# Patient Record
Sex: Female | Born: 1970
Health system: Southern US, Community
[De-identification: ages and names within clinical notes are randomized; demographics above are authoritative.]

## PROBLEM LIST (undated history)

## (undated) DIAGNOSIS — T7840XA Allergy, unspecified, initial encounter: Secondary | ICD-10-CM

## (undated) DIAGNOSIS — I10 Essential (primary) hypertension: Secondary | ICD-10-CM

## (undated) DIAGNOSIS — D259 Leiomyoma of uterus, unspecified: Secondary | ICD-10-CM

## (undated) DIAGNOSIS — O3410 Maternal care for benign tumor of corpus uteri, unspecified trimester: Secondary | ICD-10-CM

## (undated) DIAGNOSIS — F419 Anxiety disorder, unspecified: Secondary | ICD-10-CM

## (undated) DIAGNOSIS — M1990 Unspecified osteoarthritis, unspecified site: Secondary | ICD-10-CM

## (undated) HISTORY — DX: Unspecified osteoarthritis, unspecified site: M19.90

## (undated) HISTORY — DX: Leiomyoma of uterus, unspecified: D25.9

## (undated) HISTORY — PX: TONSILLECTOMY: SUR1361

## (undated) HISTORY — DX: Anxiety disorder, unspecified: F41.9

## (undated) HISTORY — DX: Allergy, unspecified, initial encounter: T78.40XA

## (undated) HISTORY — DX: Essential (primary) hypertension: I10

## (undated) HISTORY — DX: Leiomyoma of uterus, unspecified: O34.10

---

## 2007-04-01 ENCOUNTER — Ambulatory Visit: Payer: Self-pay | Admitting: Obstetrics & Gynecology

## 2007-04-08 ENCOUNTER — Encounter: Payer: Self-pay | Admitting: Family

## 2007-04-08 ENCOUNTER — Ambulatory Visit: Payer: Self-pay | Admitting: Family

## 2007-04-25 ENCOUNTER — Ambulatory Visit (HOSPITAL_COMMUNITY): Admission: RE | Admit: 2007-04-25 | Discharge: 2007-04-25 | Payer: Self-pay | Admitting: Obstetrics & Gynecology

## 2007-05-07 ENCOUNTER — Ambulatory Visit: Payer: Self-pay | Admitting: Physician Assistant

## 2007-05-15 ENCOUNTER — Ambulatory Visit (HOSPITAL_COMMUNITY): Admission: RE | Admit: 2007-05-15 | Discharge: 2007-05-15 | Payer: Self-pay | Admitting: Obstetrics & Gynecology

## 2007-05-30 ENCOUNTER — Ambulatory Visit (HOSPITAL_COMMUNITY): Admission: RE | Admit: 2007-05-30 | Discharge: 2007-05-30 | Payer: Self-pay | Admitting: Obstetrics & Gynecology

## 2007-06-10 ENCOUNTER — Ambulatory Visit: Payer: Self-pay | Admitting: Family

## 2007-07-08 ENCOUNTER — Ambulatory Visit: Payer: Self-pay | Admitting: Obstetrics and Gynecology

## 2007-07-12 ENCOUNTER — Ambulatory Visit (HOSPITAL_COMMUNITY): Admission: RE | Admit: 2007-07-12 | Discharge: 2007-07-12 | Payer: Self-pay | Admitting: Obstetrics & Gynecology

## 2007-07-30 ENCOUNTER — Ambulatory Visit: Payer: Self-pay | Admitting: Family

## 2007-08-21 ENCOUNTER — Ambulatory Visit (HOSPITAL_COMMUNITY): Admission: RE | Admit: 2007-08-21 | Discharge: 2007-08-21 | Payer: Self-pay | Admitting: Obstetrics & Gynecology

## 2007-08-23 ENCOUNTER — Ambulatory Visit: Payer: Self-pay | Admitting: Physician Assistant

## 2007-09-10 ENCOUNTER — Ambulatory Visit: Payer: Self-pay | Admitting: Obstetrics and Gynecology

## 2007-09-27 ENCOUNTER — Ambulatory Visit: Payer: Self-pay | Admitting: Obstetrics and Gynecology

## 2007-10-04 ENCOUNTER — Ambulatory Visit: Payer: Self-pay | Admitting: Family

## 2007-10-11 ENCOUNTER — Ambulatory Visit: Payer: Self-pay | Admitting: Family

## 2007-10-18 ENCOUNTER — Ambulatory Visit: Payer: Self-pay | Admitting: Physician Assistant

## 2007-10-22 ENCOUNTER — Inpatient Hospital Stay (HOSPITAL_COMMUNITY): Admission: AD | Admit: 2007-10-22 | Discharge: 2007-10-24 | Payer: Self-pay | Admitting: Obstetrics & Gynecology

## 2007-10-22 ENCOUNTER — Ambulatory Visit: Payer: Self-pay | Admitting: Family

## 2007-11-08 ENCOUNTER — Ambulatory Visit: Payer: Self-pay | Admitting: Physician Assistant

## 2007-12-06 ENCOUNTER — Ambulatory Visit: Payer: Self-pay | Admitting: Physician Assistant

## 2008-07-30 ENCOUNTER — Ambulatory Visit: Payer: Self-pay | Admitting: Family Medicine

## 2008-08-20 ENCOUNTER — Ambulatory Visit: Payer: Self-pay | Admitting: Family Medicine

## 2008-08-20 DIAGNOSIS — M722 Plantar fascial fibromatosis: Secondary | ICD-10-CM

## 2008-08-20 DIAGNOSIS — R635 Abnormal weight gain: Secondary | ICD-10-CM

## 2008-08-21 ENCOUNTER — Encounter: Payer: Self-pay | Admitting: Family Medicine

## 2008-08-24 LAB — CONVERTED CEMR LAB
ALT: 11 units/L (ref 0–35)
Albumin: 4.2 g/dL (ref 3.5–5.2)
CO2: 22 meq/L (ref 19–32)
Calcium: 9.3 mg/dL (ref 8.4–10.5)
Chloride: 105 meq/L (ref 96–112)
Cholesterol: 194 mg/dL (ref 0–200)
Glucose, Bld: 78 mg/dL (ref 70–99)
Sodium: 140 meq/L (ref 135–145)
Total Bilirubin: 0.6 mg/dL (ref 0.3–1.2)
Total Protein: 7.2 g/dL (ref 6.0–8.3)
Triglycerides: 108 mg/dL (ref <150)
VLDL: 22 mg/dL (ref 0–40)

## 2008-09-02 ENCOUNTER — Ambulatory Visit: Payer: Self-pay | Admitting: Obstetrics & Gynecology

## 2008-09-02 ENCOUNTER — Encounter: Payer: Self-pay | Admitting: Obstetrics & Gynecology

## 2009-10-12 ENCOUNTER — Ambulatory Visit: Payer: Self-pay | Admitting: Family Medicine

## 2009-10-14 ENCOUNTER — Encounter: Payer: Self-pay | Admitting: Family Medicine

## 2010-01-13 ENCOUNTER — Ambulatory Visit: Payer: Self-pay | Admitting: Family Medicine

## 2010-04-05 NOTE — Assessment & Plan Note (Signed)
Summary: CPE w/o pap   Vital Signs:  Patient profile:   40 year old female Menstrual status:  regular Height:      65 inches Weight:      199 pounds BMI:     33.24 O2 Sat:      97 % on Room air Pulse rate:   93 / minute BP sitting:   126 / 73  (left arm) Cuff size:   large  Vitals Entered By: Payton Spark CMA (October 12, 2009 3:34 PM)  O2 Flow:  Room air CC: CPE w/ out pap. Employment form.  Vision Screening:Left eye w/o correction: 20 / 20 Right Eye w/o correction: 20 / 20 Both eyes w/o correction:  20/ 20  Color vision testing: normal      Vision Entered By: Payton Spark CMA (October 12, 2009 3:35 PM)   Primary Care Provider:  Seymour Bars DO  CC:  CPE w/ out pap. Employment form.Marland Kitchen  History of Present Illness: 40 yo WF presents for CPE without pap smear.  She is G1P1 and would like to get pregnant again.  She would also like to lose wt but admits to some poor dietary habits and lack of regular exercise.  She is taking a MVI daily.  Denies a fam hx of breast or colon cancer.  Denies fam hx of premature heart dz.  She sees gyn for her pap smears.  She had normal labs last year.  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    Allergies: No Known Drug Allergies  Past History:  Past Medical History: Reviewed history from 08/20/2008 and no changes required. G1P1001 NSVD, term baby girl  plantar fasciitis  Past Surgical History: Reviewed history from 08/20/2008 and no changes required. tonsillectomy  Family History: Reviewed history from 07/30/2008 and no changes required. Mother, Healthy Father, Helthy Brother, Healthy Sister, Healthy  Social History: Reviewed history from 08/20/2008 and no changes required. [place] [employer], broadcasting/film/video at [employer]. Has masters degree. 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 Never smoked. Denies ETOH. Exercises 3 days/ wk. Interested in getting pregant -- not on birth  control  Review of Systems  The patient denies anorexia, fever, weight loss, weight gain, vision loss, decreased hearing, hoarseness, chest pain, syncope, dyspnea on exertion, peripheral edema, prolonged cough, headaches, hemoptysis, abdominal pain, melena, hematochezia, severe indigestion/heartburn, hematuria, incontinence, genital sores, muscle weakness, suspicious skin lesions, transient blindness, difficulty walking, depression, unusual weight change, abnormal bleeding, enlarged lymph nodes, angioedema, breast masses, and testicular masses.    Physical Exam  General:  alert, well-developed, well-nourished, well-hydrated, and overweight-appearing.   Head:  normocephalic and atraumatic.   Eyes:  pupils equal, pupils round, and pupils reactive to light.   Ears:  no external deformities.   Nose:  no nasal discharge.   Mouth:  good dentition and pharynx pink and moist.   Neck:  no masses.   Lungs:  Normal respiratory effort, chest expands symmetrically. Lungs are clear to auscultation, no crackles or wheezes. Heart:  Normal rate and regular rhythm. S1 and S2 normal without gallop, murmur, click, rub or other extra sounds. Abdomen:  Bowel sounds positive,abdomen soft and non-tender without masses, organomegaly Pulses:  2+ radial and pedal pulses Extremities:  no LE edema Skin:  color normal and no suspicious lesions.   Cervical Nodes:  No lymphadenopathy noted Psych:  good eye contact, not anxious appearing, and not depressed appearing.  Impression & Recommendations:  Problem # 1:  HEALTH MAINTENANCE EXAM (ICD-V70.0) Keeping healthy checklist for women reviewed. BP at goal.  BMI 33 with a 12 lb wt gain.  Work on [employer], regular exercise, wt loss. MVI daily with Calcium/ D daily. Due for fasting labs in 1 yr. PPD today.  School form today.  REad PPD in 2 days.  Tdap is UTD. pap smears thru GYN.  Other Orders: TB Skin Test [phone]) Admin 1st Vaccine (11914) Admin 1st Vaccine  [facility]) [id number])  Patient Instructions: 1)  Weight Watchers Online  2)  1500 kcal/ day -- goal. 3)  45+ min of exercise 5 days/ wk -- goal 4)  Return for TB reading in 2 days. 5)  Return for a nurse visit WT CHECK in 6 wks.     PPD Application    Vaccine Type: PPD    Site: right forearm    Dose: 0.1 ml    Route: ID    Given by: Payton Spark CMA   Appended Document: CPE w/o pap   PPD Results    Date of reading: 10/14/2009    Results: < 5mm    Interpretation: negative

## 2010-04-05 NOTE — Assessment & Plan Note (Signed)
Summary: WEIGHT CK  Nurse Visit   Vital Signs:  Patient profile:   40 year old female Menstrual status:  regular Height:      65 inches Weight:      201 pounds   Primary Care Provider:  Seymour Bars DO   History of Present Illness: Wt check using wt watchers online. hadnt stuck closley with it lately   Allergies: No Known Drug Allergies  Orders Added: 1)  Est. Patient Level I [04540]

## 2010-04-05 NOTE — Letter (Signed)
Summary: Health Exam Form/[facility] [employer] [facility]  Health Exam Form/[facility]   Imported By: Lanelle Bal 10/21/2009 12:04:47  _____________________________________________________________________  External Attachment:    Type:   Image     Comment:   External Document

## 2010-07-19 NOTE — Assessment & Plan Note (Signed)
NAME:  Elaine Murillo, Elaine Murillo NO.:  [id number]   MEDICAL RECORD NO.:  [id number]          PATIENT TYPE:  POB   LOCATION:  [facility] at [place]         FACILITY:  [facility]   PHYSICIAN:  Elsie Lincoln, MD      DATE OF BIRTH:  September 29, 1970   DATE OF SERVICE:                                  CLINIC NOTE   The patient is a 40 year old para 1 female who presents for her yearly  exam.  The patient delivered with [place] approximately 10 months ago, a  beautiful baby grown, name Sophia.  She is breast-feeding and doing  well.  She is sexually active occasionally and uses some sort of the  rhythm method.  For birth control, she states that they do not have sex  that often and whatever happens will happen.  The patient is having  normal periods without any problems.  She has absolutely no complaints  today.   PAST MEDICAL HISTORY:  Fibroid tumors of the uterus.   PAST SURGICAL HISTORY:  Tonsillectomy.   GYN HISTORY:  Fibroid tumors that is not causing any problems in labor.  She had 1 vaginal delivery without incident.  The baby weighed 7 pounds  and 11 ounces and was 20-1/4th inches long.   FAMILY HISTORY:  No familial female cancers.  No colon cancer.   MEDICATIONS:  None.   ALLERGIES:  None.   HEALTH CARE MAINTENANCE:  The patient flosses, but not regularly.  The  patient uses sunscreen, but not regularly.  The patient does regularly  use seatbelts.  She is not due for a mammogram until age 89 and  colonoscopy until age 71.   PHYSICAL EXAMINATION:  VITAL SIGNS:  Pulse 88, blood pressure 124/86,  and weight 188.  GENERAL:  Well nourished and well developed in no apparent distress.  HEENT:  Normocephalic, atraumatic.  Good dentition.  Thyroid, no masses.  LUNGS:  Clear to auscultation bilaterally.  HEART:  Regular rate and rhythm.  BREASTS:  No masses or skin changes.  LYMPHATIC:  No lymphadenopathy.  ABDOMEN:  Soft and nontender.  No organomegaly.  No hernia.  GENITALIA:   Tanner 5.  Vagina is pink and normal rugae.  Cervix is  closed and nontender.  Urethra and bladder is well suspended and  nontender.  Adnexa, no masses and nontender.  RECTAL:  No hemorrhoids.  EXTREMITIES:  Nontender.   ASSESSMENT AND PLAN:  A 40 year old para 1 female with well-woman exam.  1. Pap smear.  2. The patient is to take prenatal vitamins, when she tries to get      pregnant.  3. Return to clinic in a year or sooner, if needed.           ______________________________  Elsie Lincoln, MD     KL/MEDQ  D:  09/02/2008  T:  09/03/2008  Job:  [id number]

## 2011-11-20 ENCOUNTER — Encounter: Payer: Self-pay | Admitting: Physician Assistant

## 2011-11-20 ENCOUNTER — Ambulatory Visit (INDEPENDENT_AMBULATORY_CARE_PROVIDER_SITE_OTHER): Payer: 59 | Admitting: Physician Assistant

## 2011-11-20 VITALS — BP 119/6 | Temp 97.2°F | Wt 206.0 lb

## 2011-11-20 DIAGNOSIS — Z113 Encounter for screening for infections with a predominantly sexual mode of transmission: Secondary | ICD-10-CM

## 2011-11-20 DIAGNOSIS — Z1151 Encounter for screening for human papillomavirus (HPV): Secondary | ICD-10-CM

## 2011-11-20 DIAGNOSIS — Z3480 Encounter for supervision of other normal pregnancy, unspecified trimester: Secondary | ICD-10-CM

## 2011-11-20 DIAGNOSIS — Z124 Encounter for screening for malignant neoplasm of cervix: Secondary | ICD-10-CM

## 2011-11-20 DIAGNOSIS — O09529 Supervision of elderly multigravida, unspecified trimester: Secondary | ICD-10-CM

## 2011-11-20 NOTE — Patient Instructions (Signed)
Pregnancy - First Trimester  During sexual intercourse, millions of sperm go into the vagina. Only 1 sperm will penetrate and fertilize the female egg while it is in the Fallopian tube. One week later, the fertilized egg implants into the wall of the uterus. An embryo begins to develop into a baby. At 6 to 8 weeks, the eyes and face are formed and the heartbeat can be seen on ultrasound. At the end of 12 weeks (first trimester), all the baby's organs are formed. Now that you are pregnant, you will want to do everything you can to have a healthy baby. Two of the most important things are to get good prenatal care and follow your caregiver's instructions. Prenatal care is all the medical care you receive before the baby's birth. It is given to prevent, find, and treat problems during the pregnancy and childbirth.  PRENATAL EXAMS   During prenatal visits, your weight, blood pressure and urine are checked. This is done to make sure you are healthy and progressing normally during the pregnancy.   A pregnant woman should gain 25 to 35 pounds during the pregnancy. However, if you are over weight or underweight, your caregiver will advise you regarding your weight.   Your caregiver will ask and answer questions for you.   Blood work, cervical cultures, other necessary tests and a Pap test are done during your prenatal exams. These tests are done to check on your health and the probable health of your baby. Tests are strongly recommended and done for HIV with your permission. This is the virus that causes AIDS. These tests are done because medications can be given to help prevent your baby from being born with this infection should you have been infected without knowing it. Blood work is also used to find out your blood type, previous infections and follow your blood levels (hemoglobin).   Low hemoglobin (anemia) is common during pregnancy. Iron and vitamins are given to help prevent this. Later in the pregnancy, blood  tests for diabetes will be done along with any other tests if any problems develop. You may need tests to make sure you and the baby are doing well.   You may need other tests to make sure you and the baby are doing well.  CHANGES DURING THE FIRST TRIMESTER (THE FIRST 3 MONTHS OF PREGNANCY)  Your body goes through many changes during pregnancy. They vary from person to person. Talk to your caregiver about changes you notice and are concerned about. Changes can include:   Your menstrual period stops.   The egg and sperm carry the genes that determine what you look like. Genes from you and your partner are forming a baby. The female genes determine whether the baby is a boy or a girl.   Your body increases in girth and you may feel bloated.   Feeling sick to your stomach (nauseous) and throwing up (vomiting). If the vomiting is uncontrollable, call your caregiver.   Your breasts will begin to enlarge and become tender.   Your nipples may stick out more and become darker.   The need to urinate more. Painful urination may mean you have a bladder infection.   Tiring easily.   Loss of appetite.   Cravings for certain kinds of food.   At first, you may gain or lose a couple of pounds.   You may have changes in your emotions from day to day (excited to be pregnant or concerned something may go wrong with   the pregnancy and baby).   You may have more vivid and strange dreams.  HOME CARE INSTRUCTIONS    It is very important to avoid all smoking, alcohol and un-prescribed drugs during your pregnancy. These affect the formation and growth of the baby. Avoid chemicals while pregnant to ensure the delivery of a healthy infant.   Start your prenatal visits by the 12th week of pregnancy. They are usually scheduled monthly at first, then more often in the last 2 months before delivery. Keep your caregiver's appointments. Follow your caregiver's instructions regarding medication use, blood and lab tests, exercise, and  diet.   During pregnancy, you are providing food for you and your baby. Eat regular, well-balanced meals. Choose foods such as meat, fish, milk and other low fat dairy products, vegetables, fruits, and whole-grain breads and cereals. Your caregiver will tell you of the ideal weight gain.   You can help morning sickness by keeping soda crackers at the bedside. Eat a couple before arising in the morning. You may want to use the crackers without salt on them.   Eating 4 to 5 small meals rather than 3 large meals a day also may help the nausea and vomiting.   Drinking liquids between meals instead of during meals also seems to help nausea and vomiting.   A physical sexual relationship may be continued throughout pregnancy if there are no other problems. Problems may be early (premature) leaking of amniotic fluid from the membranes, vaginal bleeding, or belly (abdominal) pain.   Exercise regularly if there are no restrictions. Check with your caregiver or physical therapist if you are unsure of the safety of some of your exercises. Greater weight gain will occur in the last 2 trimesters of pregnancy. Exercising will help:   Control your weight.   Keep you in shape.   Prepare you for labor and delivery.   Help you lose your pregnancy weight after you deliver your baby.   Wear a good support or jogging bra for breast tenderness during pregnancy. This may help if worn during sleep too.   Ask when prenatal classes are available. Begin classes when they are offered.   Do not use hot tubs, steam rooms or saunas.   Wear your seat belt when driving. This protects you and your baby if you are in an accident.   Avoid raw meat, uncooked cheese, cat litter boxes and soil used by cats throughout the pregnancy. These carry germs that can cause birth defects in the baby.   The first trimester is a good time to visit your dentist for your dental health. Getting your teeth cleaned is OK. Use a softer toothbrush and brush  gently during pregnancy.   Ask for help if you have financial, counseling or nutritional needs during pregnancy. Your caregiver will be able to offer counseling for these needs as well as refer you for other special needs.   Do not take any medications or herbs unless told by your caregiver.   Inform your caregiver if there is any mental or physical domestic violence.   Make a list of emergency phone numbers of family, friends, hospital, and police and fire departments.   Write down your questions. Take them to your prenatal visit.   Do not douche.   Do not cross your legs.   If you have to stand for long periods of time, rotate you feet or take small steps in a circle.   You may have more vaginal secretions that may   require a sanitary pad. Do not use tampons or scented sanitary pads.  MEDICATIONS AND DRUG USE IN PREGNANCY   Take prenatal vitamins as directed. The vitamin should contain 1 milligram of folic acid. Keep all vitamins out of reach of children. Only a couple vitamins or tablets containing iron may be fatal to a baby or young child when ingested.   Avoid use of all medications, including herbs, over-the-counter medications, not prescribed or suggested by your caregiver. Only take over-the-counter or prescription medicines for pain, discomfort, or fever as directed by your caregiver. Do not use aspirin, ibuprofen, or naproxen unless directed by your caregiver.   Let your caregiver also know about herbs you may be using.   Alcohol is related to a number of birth defects. This includes fetal alcohol syndrome. All alcohol, in any form, should be avoided completely. Smoking will cause low birth rate and premature babies.   Street or illegal drugs are very harmful to the baby. They are absolutely forbidden. A baby born to an addicted mother will be addicted at birth. The baby will go through the same withdrawal an adult does.   Let your caregiver know about any medications that you have to take  and for what reason you take them.  MISCARRIAGE IS COMMON DURING PREGNANCY  A miscarriage does not mean you did something wrong. It is not a reason to worry about getting pregnant again. Your caregiver will help you with questions you may have. If you have a miscarriage, you may need minor surgery.  SEEK MEDICAL CARE IF:   You have any concerns or worries during your pregnancy. It is better to call with your questions if you feel they cannot wait, rather than worry about them.  SEEK IMMEDIATE MEDICAL CARE IF:    An unexplained oral temperature above 102 F (38.9 C) develops, or as your caregiver suggests.   You have leaking of fluid from the vagina (birth canal). If leaking membranes are suspected, take your temperature and inform your caregiver of this when you call.   There is vaginal spotting or bleeding. Notify your caregiver of the amount and how many pads are used.   You develop a bad smelling vaginal discharge with a change in the color.   You continue to feel sick to your stomach (nauseated) and have no relief from remedies suggested. You vomit blood or coffee ground-like materials.   You lose more than 2 pounds of weight in 1 week.   You gain more than 2 pounds of weight in 1 week and you notice swelling of your face, hands, feet, or legs.   You gain 5 pounds or more in 1 week (even if you do not have swelling of your hands, face, legs, or feet).   You get exposed to German measles and have never had them.   You are exposed to fifth disease or chickenpox.   You develop belly (abdominal) pain. Round ligament discomfort is a common non-cancerous (benign) cause of abdominal pain in pregnancy. Your caregiver still must evaluate this.   You develop headache, fever, diarrhea, pain with urination, or shortness of breath.   You fall or are in a car accident or have any kind of trauma.   There is mental or physical violence in your home.  Document Released: 02/14/2001 Document Revised: 02/09/2011  Document Reviewed: 08/18/2008  ExitCare Patient Information 2012 [employer].
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
## 2011-11-21 LAB — COMPREHENSIVE METABOLIC PANEL
ALT: 10 U/L (ref 0–35)
Alkaline Phosphatase: 52 U/L (ref 39–117)
CO2: 26 mEq/L (ref 19–32)
Creat: 0.69 mg/dL (ref 0.50–1.10)
Glucose, Bld: 59 mg/dL — ABNORMAL LOW (ref 70–99)
Sodium: 138 mEq/L (ref 135–145)
Total Bilirubin: 0.3 mg/dL (ref 0.3–1.2)
Total Protein: 6.9 g/dL (ref 6.0–8.3)

## 2011-11-21 LAB — OBSTETRIC PANEL
Eosinophils Absolute: 0.1 10*3/uL (ref 0.0–0.7)
Eosinophils Relative: 1 % (ref 0–5)
HCT: 43 % (ref 36.0–46.0)
Hemoglobin: 14.6 g/dL (ref 12.0–15.0)
Lymphocytes Relative: 22 % (ref 12–46)
Lymphs Abs: 2.6 10*3/uL (ref 0.7–4.0)
MCH: 30.1 pg (ref 26.0–34.0)
MCV: 88.7 fL (ref 78.0–100.0)
Monocytes Absolute: 0.7 10*3/uL (ref 0.1–1.0)
Monocytes Relative: 6 % (ref 3–12)
RBC: 4.85 MIL/uL (ref 3.87–5.11)
Rh Type: POSITIVE
Rubella: 14.3 IU/mL — ABNORMAL HIGH
WBC: 11.5 10*3/uL — ABNORMAL HIGH (ref 4.0–10.5)

## 2011-11-21 LAB — HIV ANTIBODY (ROUTINE TESTING W REFLEX): HIV: NONREACTIVE

## 2011-11-25 LAB — CULTURE, URINE COMPREHENSIVE: Colony Count: 30000

## 2011-12-08 ENCOUNTER — Telehealth: Payer: Self-pay | Admitting: *Deleted

## 2011-12-08 DIAGNOSIS — Z3480 Encounter for supervision of other normal pregnancy, unspecified trimester: Secondary | ICD-10-CM

## 2011-12-08 NOTE — Telephone Encounter (Signed)
Pt called requesting 1 st trimester screening be scheduled.

## 2011-12-15 ENCOUNTER — Other Ambulatory Visit: Payer: Self-pay

## 2011-12-15 ENCOUNTER — Ambulatory Visit (HOSPITAL_COMMUNITY)
Admission: RE | Admit: 2011-12-15 | Discharge: 2011-12-15 | Disposition: A | Discharge status: Home / Self Care | Payer: 59 | Source: Ambulatory Visit | Attending: Obstetrics & Gynecology | Admitting: Obstetrics & Gynecology

## 2011-12-15 DIAGNOSIS — Z3480 Encounter for supervision of other normal pregnancy, unspecified trimester: Secondary | ICD-10-CM

## 2011-12-15 DIAGNOSIS — IMO0002 Reserved for concepts with insufficient information to code with codable children: Secondary | ICD-10-CM

## 2011-12-15 DIAGNOSIS — O351XX0 Maternal care for (suspected) chromosomal abnormality in fetus, not applicable or unspecified: Secondary | ICD-10-CM | POA: Insufficient documentation

## 2011-12-15 DIAGNOSIS — O3510X0 Maternal care for (suspected) chromosomal abnormality in fetus, unspecified, not applicable or unspecified: Secondary | ICD-10-CM | POA: Insufficient documentation

## 2011-12-15 DIAGNOSIS — Z3689 Encounter for other specified antenatal screening: Secondary | ICD-10-CM | POA: Insufficient documentation

## 2011-12-15 DIAGNOSIS — O09529 Supervision of elderly multigravida, unspecified trimester: Secondary | ICD-10-CM | POA: Insufficient documentation

## 2011-12-15 NOTE — Addendum Note (Signed)
Encounter addended by: Alessandra Bevels. Chase Picket, RN on: 12/15/2011  1:47 PM<BR>     Documentation filed: Charges VN

## 2011-12-15 NOTE — Progress Notes (Signed)
Elaine Murillo  was seen today for an ultrasound appointment.  See full report in [facility].  Alpha Gula, MD  Single IUP at 12 2/7 weeks Normal NT (1.2 mm); nasal bone was visualized After counseling, the patient elected to undergo cell free fetal DNA testing ([facility])  Recommend follow up ultrasound in 6-7 weeks for anatomy. Offer MSAFP in the second trimester for ONTD screening

## 2011-12-18 ENCOUNTER — Ambulatory Visit (INDEPENDENT_AMBULATORY_CARE_PROVIDER_SITE_OTHER): Payer: 59 | Admitting: Advanced Practice Midwife

## 2011-12-18 ENCOUNTER — Encounter: Payer: Self-pay | Admitting: Obstetrics & Gynecology

## 2011-12-18 VITALS — BP 121/78 | Temp 98.8°F | Wt 203.0 lb

## 2011-12-18 DIAGNOSIS — Z3490 Encounter for supervision of normal pregnancy, unspecified, unspecified trimester: Secondary | ICD-10-CM | POA: Insufficient documentation

## 2011-12-18 DIAGNOSIS — Z3480 Encounter for supervision of other normal pregnancy, unspecified trimester: Secondary | ICD-10-CM

## 2011-12-18 DIAGNOSIS — Z23 Encounter for immunization: Secondary | ICD-10-CM

## 2011-12-18 MED ORDER — INFLUENZA VIRUS VACC SPLIT PF IM SUSP
0.5000 mL | Freq: Once | INTRAMUSCULAR | Status: AC
  Administered 2011-12-18: 0.5 mL via INTRAMUSCULAR

## 2011-12-18 NOTE — Progress Notes (Signed)
Doing well.  Denies vaginal bleeding, LOF, cramping.  Anxious about pending [facility]ony results.  Has not told family/friends about pregnancy yet.  Reassured pt most pregnancies normal at any age.  Flu shot given today.

## 2011-12-18 NOTE — Addendum Note (Signed)
Addended by: Granville Lewis on: 12/18/2011 10:57 AM   Modules accepted: Orders

## 2011-12-18 NOTE — Progress Notes (Signed)
p-77 

## 2011-12-19 ENCOUNTER — Ambulatory Visit (HOSPITAL_COMMUNITY): Admission: RE | Admit: 2011-12-19 | Payer: 59 | Source: Ambulatory Visit

## 2011-12-19 ENCOUNTER — Ambulatory Visit (HOSPITAL_COMMUNITY): Payer: 59

## 2011-12-26 ENCOUNTER — Telehealth (HOSPITAL_COMMUNITY): Payer: Self-pay | Admitting: MS"

## 2011-12-26 NOTE — Telephone Encounter (Signed)
Called Elaine Murillo to discuss her Elaine Murillo, cell free fetal DNA testing. Testing was offered because of advanced maternal age. We reviewed that these are within normal limits, showing a less than 1 in 10,000 risk for trisomies 21, 18 and 13. We reviewed that this testing identifies > 99% of pregnancies with trisomy 21, >98% of pregnancies with trisomy 40, and >80% with trisomy 92; the false positive rate is <0.1% for all conditions. Testing was also performed for X and Y chromosome analysis. This did not show evidence of aneuploidy for X or Y. Ms. Elaine Murillo declined information regarding fetal gender at this time. She will call our office back, should she elect to obtain information regarding fetal gender from the X and Y analysis. X and Y analysis has a detection rate of approximately 99%. She understands that this testing does not identify all genetic conditions. All questions were answered to her satisfaction, she was encouraged to call with additional questions or concerns.  Quinn Plowman, MS Certified Genetic Counselor 12/26/2011 12:17 PM     Left message for patient to return call.   Elaine Murillo 12/26/2011 11:29 AM

## 2012-01-05 ENCOUNTER — Telehealth (HOSPITAL_COMMUNITY): Payer: Self-pay | Admitting: MS"

## 2012-01-05 NOTE — Telephone Encounter (Signed)
Ms. Elaine Murillo called back to inquire about fetal gender from cell free fetal DNA ([facility]) testing given that she would like to know gender now. At the time that we previously discussed results, the patient did not wish to know fetal gender. We discussed that testing performed for X and Y chromosome analysis was consistent with female gender. X and Y analysis has a detection rate of approximately 99%. All questions were answered to her satisfaction, she was encouraged to call with additional questions or concerns.  Quinn Plowman, MS Patent attorney

## 2012-01-17 ENCOUNTER — Encounter: Payer: Self-pay | Admitting: Obstetrics & Gynecology

## 2012-01-17 ENCOUNTER — Ambulatory Visit (INDEPENDENT_AMBULATORY_CARE_PROVIDER_SITE_OTHER): Payer: 59 | Admitting: Obstetrics & Gynecology

## 2012-01-17 VITALS — BP 119/63 | Temp 97.7°F | Wt 206.0 lb

## 2012-01-17 DIAGNOSIS — Z3480 Encounter for supervision of other normal pregnancy, unspecified trimester: Secondary | ICD-10-CM

## 2012-01-17 DIAGNOSIS — O0990 Supervision of high risk pregnancy, unspecified, unspecified trimester: Secondary | ICD-10-CM

## 2012-01-17 LAB — TSH: TSH: 1.223 u[IU]/mL (ref 0.350–4.500)

## 2012-01-17 NOTE — Progress Notes (Signed)
Harmony negative; AFP today.  Lubriderm for dry skin.  Has anatomy scan scheduled.

## 2012-01-17 NOTE — Progress Notes (Signed)
p=90 

## 2012-01-25 ENCOUNTER — Encounter: Payer: Self-pay | Admitting: Obstetrics & Gynecology

## 2012-01-30 ENCOUNTER — Other Ambulatory Visit (HOSPITAL_COMMUNITY): Payer: 59

## 2012-02-05 ENCOUNTER — Other Ambulatory Visit (HOSPITAL_COMMUNITY): Payer: Self-pay | Admitting: Maternal and Fetal Medicine

## 2012-02-05 ENCOUNTER — Ambulatory Visit (HOSPITAL_COMMUNITY)
Admission: RE | Admit: 2012-02-05 | Discharge: 2012-02-05 | Disposition: A | Discharge status: Home / Self Care | Payer: 59 | Source: Ambulatory Visit | Attending: Obstetrics & Gynecology | Admitting: Obstetrics & Gynecology

## 2012-02-05 ENCOUNTER — Encounter (HOSPITAL_COMMUNITY): Payer: Self-pay

## 2012-02-05 VITALS — BP 107/64 | HR 77 | Wt 207.0 lb

## 2012-02-05 DIAGNOSIS — O09529 Supervision of elderly multigravida, unspecified trimester: Secondary | ICD-10-CM | POA: Insufficient documentation

## 2012-02-05 DIAGNOSIS — O358XX0 Maternal care for other (suspected) fetal abnormality and damage, not applicable or unspecified: Secondary | ICD-10-CM | POA: Insufficient documentation

## 2012-02-05 DIAGNOSIS — IMO0002 Reserved for concepts with insufficient information to code with codable children: Secondary | ICD-10-CM

## 2012-02-05 DIAGNOSIS — O3410 Maternal care for benign tumor of corpus uteri, unspecified trimester: Secondary | ICD-10-CM | POA: Insufficient documentation

## 2012-02-05 DIAGNOSIS — Z363 Encounter for antenatal screening for malformations: Secondary | ICD-10-CM | POA: Insufficient documentation

## 2012-02-05 DIAGNOSIS — Z1389 Encounter for screening for other disorder: Secondary | ICD-10-CM | POA: Insufficient documentation

## 2012-02-05 NOTE — Progress Notes (Signed)
Vendetta Cada  was seen today for an ultrasound appointment.  See full report in [facility].  Impression: Single IUP at 19 5/7 weeks Normal detailed fetal anatomy; somewhat limited views of the face (profile) and heart (aortic arch)  were obtained due to fetal position No markers associated with aneuploidy were noted Normal amniotic fluid volume  NIPT (Harmony test) - low risk for aneuploidy  Recommendations: Recommend follow up ultrasound in 4 weeks for interval growth and to reevaluate fetal heart and face.  Alpha Gula, MD

## 2012-02-07 ENCOUNTER — Encounter: Payer: Self-pay | Admitting: Obstetrics & Gynecology

## 2012-02-14 ENCOUNTER — Ambulatory Visit (INDEPENDENT_AMBULATORY_CARE_PROVIDER_SITE_OTHER): Payer: 59 | Admitting: Obstetrics & Gynecology

## 2012-02-14 ENCOUNTER — Encounter: Payer: Self-pay | Admitting: Obstetrics & Gynecology

## 2012-02-14 VITALS — BP 128/75 | Temp 98.5°F | Wt 208.0 lb

## 2012-02-14 DIAGNOSIS — Z3480 Encounter for supervision of other normal pregnancy, unspecified trimester: Secondary | ICD-10-CM

## 2012-02-14 DIAGNOSIS — Z3490 Encounter for supervision of normal pregnancy, unspecified, unspecified trimester: Secondary | ICD-10-CM

## 2012-02-14 NOTE — Progress Notes (Signed)
Routine visit. Good FM. No OB problems. U/S 1/14 for heart anatomy follow up.

## 2012-02-14 NOTE — Progress Notes (Signed)
p-89 

## 2012-03-06 NOTE — L&D Delivery Note (Signed)
Elaine Murillo is a 42 y.o. G2P1001 presenting in active labor at [redacted]w[redacted]d.  She was 9 cm on arrival to MAU and delivered precipitously before transfer to L&D.  Delivery Note At 11:32 PM a viable female was delivered via Vaginal, Spontaneous Delivery (Presentation: Left Occiput Anterior).  APGAR: 8, 9; weight pending.   Placenta status: Spontaneous, intact.  Cord: 3 vessels with the following complications: none.  Cord pH: n/a  Anesthesia: None  Episiotomy: None Lacerations: small superficial 1st degree Suture Repair: n/a Est. Blood Loss (mL): 300  Mom to postpartum.  Baby to nursery-stable.  Napoleon Form 06/24/2012, 1:32 AM

## 2012-03-11 ENCOUNTER — Ambulatory Visit (HOSPITAL_COMMUNITY)
Admission: RE | Admit: 2012-03-11 | Discharge: 2012-03-11 | Disposition: A | Discharge status: Home / Self Care | Payer: 59 | Source: Ambulatory Visit | Attending: Obstetrics & Gynecology | Admitting: Obstetrics & Gynecology

## 2012-03-11 DIAGNOSIS — IMO0002 Reserved for concepts with insufficient information to code with codable children: Secondary | ICD-10-CM

## 2012-03-11 DIAGNOSIS — O3410 Maternal care for benign tumor of corpus uteri, unspecified trimester: Secondary | ICD-10-CM | POA: Insufficient documentation

## 2012-03-11 DIAGNOSIS — O09529 Supervision of elderly multigravida, unspecified trimester: Secondary | ICD-10-CM | POA: Insufficient documentation

## 2012-03-11 NOTE — Progress Notes (Signed)
[facility] ultrasound  Indication: 42 yr old G2P1001 at [redacted]w[redacted]d for follow up ultrasound to complete fetal anatomic survey.  Findings: 1. Single intrauterine pregnancy. 2. Estimated fetal weight is in the 60th%. 3. Anterior placenta without evidence of previa. 4. Normal amniotic fluid volume. 5. Normal transabdominal cervical length. 6. The limited anatomy survey is normal. The view of the palate continues to be limited. Any anatomy not evaluated on today's exam was evaluated on the previous exam.  Recommendations: 1. Appropriate fetal growth. 2. Normal limited anatomy survey. 3. Advanced maternal age: - previously counseled - had normal Harmony screen With maternal age over 104 there is increased risk of gestational diabetes, fetal growth restriction, need for Cesarean delivery, and stillbirth. Recommend serial ultrasounds for fetal growth every 4-6 weeks. Recommend starting fetal kick counts at [redacted] weeks gestation. Recommend antenatal testing with either weekly biophysical profiles or twice weekly nonstress tests and weekly amniotic fluid index starting at 36 weeks Recommend delivery by estimated due date  Eulis Foster, MD

## 2012-03-20 ENCOUNTER — Encounter: Payer: Self-pay | Admitting: Obstetrics & Gynecology

## 2012-03-20 ENCOUNTER — Ambulatory Visit (INDEPENDENT_AMBULATORY_CARE_PROVIDER_SITE_OTHER): Payer: 59 | Admitting: Obstetrics & Gynecology

## 2012-03-20 VITALS — BP 118/75 | Wt 211.0 lb

## 2012-03-20 DIAGNOSIS — Z3480 Encounter for supervision of other normal pregnancy, unspecified trimester: Secondary | ICD-10-CM

## 2012-03-20 DIAGNOSIS — Z3490 Encounter for supervision of normal pregnancy, unspecified, unspecified trimester: Secondary | ICD-10-CM

## 2012-03-20 LAB — CBC
HCT: 36.6 % (ref 36.0–46.0)
MCHC: 34.4 g/dL (ref 30.0–36.0)
RDW: 14.2 % (ref 11.5–15.5)
WBC: 14.5 10*3/uL — ABNORMAL HIGH (ref 4.0–10.5)

## 2012-03-20 NOTE — Progress Notes (Signed)
Routine visit. Good FM. No OB problems. Rec'd a total weight gain of 15 #. Glucola and labs today.

## 2012-03-20 NOTE — Progress Notes (Signed)
P-88 - Pt states had pain after exercise class 2 days ago but seems to be better today - Also states has had dry mouth and snoring more.

## 2012-03-21 ENCOUNTER — Encounter: Payer: Self-pay | Admitting: Obstetrics & Gynecology

## 2012-03-21 ENCOUNTER — Telehealth: Payer: Self-pay | Admitting: *Deleted

## 2012-03-21 LAB — GLUCOSE TOLERANCE, 1 HOUR (50G) W/O FASTING: Glucose, 1 Hour GTT: 125 mg/dL (ref 70–140)

## 2012-03-21 LAB — HIV ANTIBODY (ROUTINE TESTING W REFLEX): HIV: NONREACTIVE

## 2012-03-21 LAB — RPR

## 2012-03-21 NOTE — Telephone Encounter (Signed)
Lm on pt's voicemail that she did pass her 1 hr GTT.

## 2012-04-10 ENCOUNTER — Ambulatory Visit (INDEPENDENT_AMBULATORY_CARE_PROVIDER_SITE_OTHER): Payer: 59 | Admitting: Obstetrics & Gynecology

## 2012-04-10 ENCOUNTER — Encounter: Payer: Self-pay | Admitting: Obstetrics & Gynecology

## 2012-04-10 VITALS — BP 133/82 | Wt 213.0 lb

## 2012-04-10 DIAGNOSIS — Z23 Encounter for immunization: Secondary | ICD-10-CM

## 2012-04-10 DIAGNOSIS — Z3480 Encounter for supervision of other normal pregnancy, unspecified trimester: Secondary | ICD-10-CM

## 2012-04-10 DIAGNOSIS — Z3490 Encounter for supervision of normal pregnancy, unspecified, unspecified trimester: Secondary | ICD-10-CM

## 2012-04-10 MED ORDER — TETANUS-DIPHTH-ACELL PERTUSSIS 5-2.5-18.5 LF-MCG/0.5 IM SUSP
0.5000 mL | Freq: Once | INTRAMUSCULAR | Status: AC
  Administered 2012-04-10: 0.5 mL via INTRAMUSCULAR

## 2012-04-10 NOTE — Progress Notes (Signed)
p=95 

## 2012-04-10 NOTE — Progress Notes (Signed)
Routine visit. Good FM. U/S last month normal with 60% growth and normal heart. TDAP today. We discussed weight gain and risk of stillbirth with obesity.

## 2012-04-15 ENCOUNTER — Ambulatory Visit (HOSPITAL_COMMUNITY): Payer: 59

## 2012-05-01 ENCOUNTER — Encounter: Payer: Self-pay | Admitting: Obstetrics & Gynecology

## 2012-05-01 ENCOUNTER — Ambulatory Visit (INDEPENDENT_AMBULATORY_CARE_PROVIDER_SITE_OTHER): Payer: 59 | Admitting: Obstetrics & Gynecology

## 2012-05-01 VITALS — BP 140/77 | Wt 214.0 lb

## 2012-05-01 DIAGNOSIS — Z3490 Encounter for supervision of normal pregnancy, unspecified, unspecified trimester: Secondary | ICD-10-CM

## 2012-05-01 DIAGNOSIS — Z3480 Encounter for supervision of other normal pregnancy, unspecified trimester: Secondary | ICD-10-CM

## 2012-05-01 NOTE — Progress Notes (Signed)
p-107  Increase in vaginal pressure

## 2012-05-01 NOTE — Progress Notes (Signed)
Routine visit. Good FM. No OB problems. She is doing her exercise classes. Kudos to her.

## 2012-05-15 ENCOUNTER — Ambulatory Visit (INDEPENDENT_AMBULATORY_CARE_PROVIDER_SITE_OTHER): Payer: 59 | Admitting: Obstetrics & Gynecology

## 2012-05-15 ENCOUNTER — Encounter: Payer: Self-pay | Admitting: Obstetrics & Gynecology

## 2012-05-15 VITALS — BP 126/80 | Wt 217.0 lb

## 2012-05-15 DIAGNOSIS — Z3480 Encounter for supervision of other normal pregnancy, unspecified trimester: Secondary | ICD-10-CM

## 2012-05-15 DIAGNOSIS — Z3490 Encounter for supervision of normal pregnancy, unspecified, unspecified trimester: Secondary | ICD-10-CM

## 2012-05-15 NOTE — Progress Notes (Signed)
P - 104 

## 2012-05-15 NOTE — Progress Notes (Signed)
Routine visit. Good FM. No problems. Cervical cultures at next visit. 

## 2012-05-26 ENCOUNTER — Encounter: Payer: Self-pay | Admitting: Obstetrics & Gynecology

## 2012-05-27 ENCOUNTER — Ambulatory Visit (HOSPITAL_COMMUNITY): Payer: 59

## 2012-05-27 ENCOUNTER — Encounter: Payer: Self-pay | Admitting: *Deleted

## 2012-05-29 ENCOUNTER — Encounter: Payer: Self-pay | Admitting: Obstetrics & Gynecology

## 2012-05-29 ENCOUNTER — Ambulatory Visit (INDEPENDENT_AMBULATORY_CARE_PROVIDER_SITE_OTHER): Payer: 59 | Admitting: Obstetrics & Gynecology

## 2012-05-29 VITALS — BP 128/76 | Wt 219.0 lb

## 2012-05-29 DIAGNOSIS — Z3493 Encounter for supervision of normal pregnancy, unspecified, third trimester: Secondary | ICD-10-CM

## 2012-05-29 DIAGNOSIS — Z3483 Encounter for supervision of other normal pregnancy, third trimester: Secondary | ICD-10-CM

## 2012-05-29 DIAGNOSIS — Z3480 Encounter for supervision of other normal pregnancy, unspecified trimester: Secondary | ICD-10-CM

## 2012-05-29 NOTE — Progress Notes (Signed)
Routine visit. Good FM. Cervical cultures done today. Labor precautions reviewed. We discussed the pros and cons of circumcision.

## 2012-05-29 NOTE — Progress Notes (Signed)
p-96  36 wk cultures today

## 2012-06-01 LAB — CULTURE, BETA STREP (GROUP B ONLY)

## 2012-06-03 ENCOUNTER — Ambulatory Visit (HOSPITAL_COMMUNITY): Payer: 59

## 2012-06-05 ENCOUNTER — Encounter: Payer: Self-pay | Admitting: Obstetrics & Gynecology

## 2012-06-05 ENCOUNTER — Other Ambulatory Visit: Payer: Self-pay | Admitting: Obstetrics & Gynecology

## 2012-06-05 ENCOUNTER — Ambulatory Visit (INDEPENDENT_AMBULATORY_CARE_PROVIDER_SITE_OTHER): Payer: 59 | Admitting: Obstetrics & Gynecology

## 2012-06-05 VITALS — BP 125/80 | Wt 221.0 lb

## 2012-06-05 DIAGNOSIS — Z3480 Encounter for supervision of other normal pregnancy, unspecified trimester: Secondary | ICD-10-CM

## 2012-06-05 DIAGNOSIS — O09523 Supervision of elderly multigravida, third trimester: Secondary | ICD-10-CM

## 2012-06-05 DIAGNOSIS — O09529 Supervision of elderly multigravida, unspecified trimester: Secondary | ICD-10-CM

## 2012-06-05 NOTE — Progress Notes (Signed)
p=103 

## 2012-06-05 NOTE — Progress Notes (Signed)
Starting twice weekly testing.  Mon/Thurs.  PT will got to MFM on Monday for NST and AFI. (testing due to extreme AMA).  Induce at 40 weeks.

## 2012-06-10 ENCOUNTER — Other Ambulatory Visit: Payer: Self-pay | Admitting: Obstetrics & Gynecology

## 2012-06-10 ENCOUNTER — Ambulatory Visit (HOSPITAL_COMMUNITY)
Admission: RE | Admit: 2012-06-10 | Discharge: 2012-06-10 | Disposition: A | Discharge status: Home / Self Care | Payer: 59 | Source: Ambulatory Visit | Attending: Obstetrics & Gynecology | Admitting: Obstetrics & Gynecology

## 2012-06-10 DIAGNOSIS — O09529 Supervision of elderly multigravida, unspecified trimester: Secondary | ICD-10-CM | POA: Insufficient documentation

## 2012-06-10 DIAGNOSIS — O09523 Supervision of elderly multigravida, third trimester: Secondary | ICD-10-CM

## 2012-06-10 DIAGNOSIS — O3410 Maternal care for benign tumor of corpus uteri, unspecified trimester: Secondary | ICD-10-CM | POA: Insufficient documentation

## 2012-06-13 ENCOUNTER — Encounter: Payer: Self-pay | Admitting: Obstetrics & Gynecology

## 2012-06-13 ENCOUNTER — Ambulatory Visit (INDEPENDENT_AMBULATORY_CARE_PROVIDER_SITE_OTHER): Payer: 59 | Admitting: Obstetrics & Gynecology

## 2012-06-13 VITALS — BP 138/83 | Wt 228.0 lb

## 2012-06-13 DIAGNOSIS — Z139 Encounter for screening, unspecified: Secondary | ICD-10-CM

## 2012-06-13 DIAGNOSIS — O09529 Supervision of elderly multigravida, unspecified trimester: Secondary | ICD-10-CM

## 2012-06-13 DIAGNOSIS — O09523 Supervision of elderly multigravida, third trimester: Secondary | ICD-10-CM

## 2012-06-13 DIAGNOSIS — Z3480 Encounter for supervision of other normal pregnancy, unspecified trimester: Secondary | ICD-10-CM

## 2012-06-13 NOTE — Progress Notes (Signed)
P-96 

## 2012-06-13 NOTE — Progress Notes (Signed)
NST / 2x week testing for extreme AMA.  Pt to be induced at 40 weeks.  No problems.

## 2012-06-17 ENCOUNTER — Ambulatory Visit (INDEPENDENT_AMBULATORY_CARE_PROVIDER_SITE_OTHER): Payer: 59 | Admitting: *Deleted

## 2012-06-17 DIAGNOSIS — O09529 Supervision of elderly multigravida, unspecified trimester: Secondary | ICD-10-CM

## 2012-06-17 DIAGNOSIS — Z3480 Encounter for supervision of other normal pregnancy, unspecified trimester: Secondary | ICD-10-CM

## 2012-06-17 NOTE — Progress Notes (Signed)
Pt came in for NST only  

## 2012-06-20 ENCOUNTER — Other Ambulatory Visit: Payer: Self-pay | Admitting: Obstetrics & Gynecology

## 2012-06-20 ENCOUNTER — Ambulatory Visit (HOSPITAL_COMMUNITY)
Admission: RE | Admit: 2012-06-20 | Discharge: 2012-06-20 | Disposition: A | Discharge status: Home / Self Care | Payer: 59 | Source: Ambulatory Visit | Attending: Obstetrics & Gynecology | Admitting: Obstetrics & Gynecology

## 2012-06-20 ENCOUNTER — Ambulatory Visit (HOSPITAL_COMMUNITY): Admission: RE | Admit: 2012-06-20 | Payer: 59 | Source: Ambulatory Visit

## 2012-06-20 VITALS — BP 122/77 | HR 100 | Wt 228.0 lb

## 2012-06-20 DIAGNOSIS — O09529 Supervision of elderly multigravida, unspecified trimester: Secondary | ICD-10-CM | POA: Insufficient documentation

## 2012-06-20 DIAGNOSIS — O09523 Supervision of elderly multigravida, third trimester: Secondary | ICD-10-CM

## 2012-06-20 DIAGNOSIS — O3410 Maternal care for benign tumor of corpus uteri, unspecified trimester: Secondary | ICD-10-CM | POA: Insufficient documentation

## 2012-06-20 DIAGNOSIS — E669 Obesity, unspecified: Secondary | ICD-10-CM | POA: Insufficient documentation

## 2012-06-20 NOTE — Progress Notes (Signed)
Elaine Murillo was seen for ultrasound appointment today.  Please see [facility] report for details.

## 2012-06-23 ENCOUNTER — Inpatient Hospital Stay (HOSPITAL_COMMUNITY)
Admission: AD | Admit: 2012-06-23 | Discharge: 2012-06-25 | DRG: 774 | Disposition: A | Discharge status: Home / Self Care | Payer: 59 | Source: Ambulatory Visit | Attending: Family Medicine | Admitting: Family Medicine

## 2012-06-23 DIAGNOSIS — O09529 Supervision of elderly multigravida, unspecified trimester: Secondary | ICD-10-CM

## 2012-06-23 MED ORDER — OXYTOCIN 10 UNIT/ML IJ SOLN
INTRAMUSCULAR | Status: AC
  Administered 2012-06-23: 10 [IU] via INTRAMUSCULAR
  Filled 2012-06-23: qty 1

## 2012-06-23 NOTE — MAU Note (Signed)
Fetal HR heard in the 90's. Oxygen applied. Ivonne Andrew CNM at bedside pushing with patient.

## 2012-06-23 NOTE — H&P (Signed)
Elaine Murillo is a 42 y.o. female G2P2002 presenting at [redacted]w[redacted]d for active labor and precipitously delivered in the MAU. She reports having no problems during this pregnancy, or medical problems in general. Does not take any medicines, no surgical hx.  Unsure about circumcision and contraception. Does plan to breast feed. Prenatal care was obtained at the [facility] in [place].    History OB History   Grav Para Term Preterm Abortions TAB SAB Ect Mult Living   2 1 1       1     Prior SVD  Past Medical History  Diagnosis Date  . Uterine fibroids affecting pregnancy   . Anxiety    Past Surgical History  Procedure Laterality Date  . Tonsillectomy     Family History: family history includes Stroke in her paternal grandmother. Social History: Denies alcohol, tobacco, drugs  Prenatal Transfer Tool  Maternal Diabetes: No Genetic Screening: Normal Maternal Ultrasounds/Referrals: Normal Fetal Ultrasounds or other Referrals:  None Maternal Substance Abuse:  No Significant Maternal Medications:  None Significant Maternal Lab Results:  Lab values include: Group B Strep negative Other Comments:  advanced maternal age (42)  ROS Abdominal pain  Blood pressure 123/86, pulse 114, temperature 97.7 F (36.5 C), temperature source Oral, resp. rate 22, height 5' 4.5" (1.638 m), weight 232 lb (105.235 kg), last menstrual period 09/20/2011, SpO2 100.00%. Exam Physical Exam  Gen: NAD Heart: RRR Lungs: NWOB Abd: fundus firm Neuro: grossly nonfocal, speech intact Ext: 2+ pitting edema bilateral lower extremities GU: normal appearing external genitalia, one superficial hemostatic laceration at 6oclock position on perineum (post-delivery)  Prenatal labs: ABO, Rh: A/POS/-- (09/16 1148) Antibody: NEG (09/16 1148) Rubella: 14.3 (09/16 1148) RPR: NON REAC (01/15 1107)  HBsAg: NEGATIVE (09/16 1148)  HIV: NON REACTIVE (01/15 1107)  GBS:   NEGATIVE  Assessment/Plan: Elaine Murillo is a 42 y.o. G2P1001 at [redacted]w[redacted]d who delivered precipitously in the MAU. Plan: -Admit to postpartum unit for routine postpartum care -plans to breastfeed -will think about circumcision for baby -will also think about contraception   Levert Feinstein 06/23/2012, 11:53 PM  I saw and examined patient and agree with above resident note. I was present for entire delivery, including placenta. I have reviewed history, labs, vitals and imaging. Napoleon Form, MD

## 2012-06-23 NOTE — MAU Note (Signed)
Pt reports rupture of membranes at 2145

## 2012-06-24 ENCOUNTER — Ambulatory Visit (HOSPITAL_COMMUNITY): Payer: 59 | Admitting: *Deleted

## 2012-06-24 ENCOUNTER — Ambulatory Visit (HOSPITAL_COMMUNITY): Payer: 59

## 2012-06-24 ENCOUNTER — Encounter (HOSPITAL_COMMUNITY): Payer: Self-pay | Admitting: *Deleted

## 2012-06-24 LAB — CBC
HCT: 36.6 % (ref 36.0–46.0)
Hemoglobin: 12.6 g/dL (ref 12.0–15.0)
MCH: 31.4 pg (ref 26.0–34.0)
MCHC: 34.8 g/dL (ref 30.0–36.0)
Platelets: 190 10*3/uL (ref 150–400)
RBC: 4.08 MIL/uL (ref 3.87–5.11)
RDW: 14 % (ref 11.5–15.5)
WBC: 23.5 10*3/uL — ABNORMAL HIGH (ref 4.0–10.5)

## 2012-06-24 LAB — RPR: RPR Ser Ql: NONREACTIVE

## 2012-06-24 LAB — TYPE AND SCREEN: Antibody Screen: NEGATIVE

## 2012-06-24 LAB — ABO/RH: ABO/RH(D): A POS

## 2012-06-24 MED ORDER — LANOLIN HYDROUS EX OINT: TOPICAL_OINTMENT | CUTANEOUS | Status: DC | PRN

## 2012-06-24 MED ORDER — TETANUS-DIPHTH-ACELL PERTUSSIS 5-2.5-18.5 LF-MCG/0.5 IM SUSP: 0.5000 mL | Freq: Once | INTRAMUSCULAR | Status: DC

## 2012-06-24 MED ORDER — PRENATAL MULTIVITAMIN CH
1.0000 | ORAL_TABLET | Freq: Every day | ORAL | Status: DC
  Administered 2012-06-24 – 2012-06-25 (×2): 1 via ORAL
  Filled 2012-06-24 (×2): qty 1

## 2012-06-24 MED ORDER — ONDANSETRON HCL 4 MG/2ML IJ SOLN: 4.0000 mg | INTRAMUSCULAR | Status: DC | PRN

## 2012-06-24 MED ORDER — SENNOSIDES-DOCUSATE SODIUM 8.6-50 MG PO TABS
2.0000 | ORAL_TABLET | Freq: Every day | ORAL | Status: DC
  Administered 2012-06-24: 2 via ORAL

## 2012-06-24 MED ORDER — SIMETHICONE 80 MG PO CHEW: 80.0000 mg | CHEWABLE_TABLET | ORAL | Status: DC | PRN

## 2012-06-24 MED ORDER — MISOPROSTOL 200 MCG PO TABS: 800.0000 ug | ORAL_TABLET | Freq: Once | ORAL | Status: AC

## 2012-06-24 MED ORDER — ZOLPIDEM TARTRATE 5 MG PO TABS: 5.0000 mg | ORAL_TABLET | Freq: Every evening | ORAL | Status: DC | PRN

## 2012-06-24 MED ORDER — DIBUCAINE 1 % RE OINT: 1.0000 "application " | TOPICAL_OINTMENT | RECTAL | Status: DC | PRN

## 2012-06-24 MED ORDER — WITCH HAZEL-GLYCERIN EX PADS: 1.0000 "application " | MEDICATED_PAD | CUTANEOUS | Status: DC | PRN

## 2012-06-24 MED ORDER — OXYCODONE-ACETAMINOPHEN 5-325 MG PO TABS
1.0000 | ORAL_TABLET | ORAL | Status: DC | PRN
  Administered 2012-06-24: 2 via ORAL
  Administered 2012-06-24 (×2): 1 via ORAL
  Filled 2012-06-24: qty 1
  Filled 2012-06-24: qty 2
  Filled 2012-06-24: qty 1

## 2012-06-24 MED ORDER — DIPHENHYDRAMINE HCL 25 MG PO CAPS: 25.0000 mg | ORAL_CAPSULE | Freq: Four times a day (QID) | ORAL | Status: DC | PRN

## 2012-06-24 MED ORDER — MISOPROSTOL 200 MCG PO TABS
ORAL_TABLET | ORAL | Status: AC
  Administered 2012-06-24: 800 ug via RECTAL
  Filled 2012-06-24: qty 4

## 2012-06-24 MED ORDER — BENZOCAINE-MENTHOL 20-0.5 % EX AERO
1.0000 "application " | INHALATION_SPRAY | CUTANEOUS | Status: DC | PRN
  Administered 2012-06-24: 1 via TOPICAL
  Filled 2012-06-24: qty 56

## 2012-06-24 MED ORDER — IBUPROFEN 600 MG PO TABS
600.0000 mg | ORAL_TABLET | Freq: Four times a day (QID) | ORAL | Status: DC
  Administered 2012-06-24 – 2012-06-25 (×8): 600 mg via ORAL
  Filled 2012-06-24 (×8): qty 1

## 2012-06-24 MED ORDER — ONDANSETRON HCL 4 MG PO TABS: 4.0000 mg | ORAL_TABLET | ORAL | Status: DC | PRN

## 2012-06-24 NOTE — H&P (Signed)
Chart reviewed and agree with management and plan.  

## 2012-06-24 NOTE — Progress Notes (Signed)
Post Partum Day 1 Subjective: up ad lib, voiding, tolerating PO and reports still bleeding. no flatus yet.  Objective: Blood pressure 115/70, pulse 100, temperature 99.2 F (37.3 C), temperature source Oral, resp. rate 18, height 5' 4.5" (1.638 m), weight 232 lb (105.235 kg), last menstrual period 09/20/2011, SpO2 97.00%, unknown if currently breastfeeding.  Physical Exam:  General: alert, cooperative and no distress Lochia: inappropriate Uterine Fundus: firm but difficult to palpate due to maternal habitus Incision: n/a DVT Evaluation: No evidence of DVT seen on physical exam. Negative Homan's sign. No cords or calf tenderness.   Recent Labs  06/24/12 0125 06/24/12 0620  HGB 13.6 12.6  HCT 39.1 36.6    Assessment/Plan: Plan for discharge tomorrow, Breastfeeding and Contraception considering options PPH: Have asked patient to show her pads to RN before throwing them away to assess for how much bleeding is present. Hgb stable this morning (12.6).   LOS: 1 day   Levert Feinstein 06/24/2012, 7:45 AM

## 2012-06-24 NOTE — Progress Notes (Signed)
I saw and examined patient and agree with above resident note. I reviewed history, delivery summary, labs and vitals. Emmarose Klinke, MD  

## 2012-06-24 NOTE — MAU Provider Note (Signed)
Chief Complaint:  Rupture of Membranes  First Provider Initiated Contact with Patient 06/24/12 0141     HPI: Elaine Murillo is a 42 y.o. year old G98P1001 female at [redacted]w[redacted]d weeks gestation who presents to MAU in advanced labor w/ strong pelvic pressure. SROM clear at 2145 at home. SVE: 9/90/0 initially, then 10/100/+3 w/ next contraction.  Past Medical History: Past Medical History  Diagnosis Date  . Uterine fibroids affecting pregnancy   . Anxiety     Past obstetric history: OB History   Grav Para Term Preterm Abortions TAB SAB Ect Mult Living   2 1 1       1      # Outc Date GA Lbr Len/2nd Wgt Sex Del Anes PTL Lv   1 TRM 8/09 [redacted]w[redacted]d  1.610RU([id number]) F SVD EPI No Yes   2 CUR               Past Surgical History: Past Surgical History  Procedure Laterality Date  . Tonsillectomy      Family History: Family History  Problem Relation Age of Onset  . Stroke Paternal Grandmother     Social History: History  Substance Use Topics  . Smoking status: Never Smoker   . Smokeless tobacco: Never Used  . Alcohol Use: No    Allergies: No Known Allergies  Meds:  Prescriptions prior to admission  Medication Sig Dispense Refill  . Docosahexaenoic Acid (PRENATAL DHA) 200 MG CAPS Take by mouth daily.        ROS: Pertinent findings in history of present illness.  Physical Exam  Blood pressure 138/79, pulse 105, temperature 97.7 F (36.5 C), temperature source Oral, resp. rate 22, height 5' 4.5" (1.638 m), weight 105.235 kg (232 lb), last menstrual period 09/20/2011, SpO2 99.00%. GENERAL: Well-developed, well-nourished female in severe distress.  HEENT: normocephalic HEART: normal rate RESP: normal effort ABDOMEN: Soft, non-tender, gravid S>D EXTREMITIES: Nontender, 1+ edema NEURO: alert and oriented Dilation: Lip/rim Effacement (%): 90 Station: 0 Presentation: Vertex Exam by:: Ivonne Andrew CNM  FHT:  FHR: 90-100, moderate variability, improved to 110's w/ position  change. Contractions: q 2 mins   Labs: No results found for this or any previous visit (from the past 24 hour(s)).  Imaging:  NA  MAU Course: Delivered in MAU. See delivery note.   Assessment: Spontaneous vaginal delivery   Plan: Admit to Mother Baby Unit.  [place], CNM 06/24/2012 1:37 AM

## 2012-06-24 NOTE — MAU Note (Signed)
Dr. Thad Ranger and Pollie Meyer at bedside placing Cytotec and exploring the uterus.

## 2012-06-24 NOTE — MAU Provider Note (Signed)
Chart reviewed and agree with management and plan.  

## 2012-06-25 NOTE — Discharge Summary (Signed)
Obstetric Discharge Summary Reason for Admission: onset of labor Prenatal Procedures: none Intrapartum Procedures: spontaneous vaginal delivery Postpartum Procedures: none Complications-Operative and Postpartum: hemorrhage Hemoglobin  Date Value Range Status  06/24/2012 12.6  12.0 - 15.0 g/dL Final     HCT  Date Value Range Status  06/24/2012 36.6  36.0 - 46.0 % Final    Physical Exam:  General: alert, cooperative and no distress Lochia: appropriate Uterine Fundus: firm Incision: NA  DVT Evaluation: No evidence of DVT seen on physical exam.  Discharge Diagnoses: Term Pregnancy-delivered  Discharge Information: Date: 06/25/2012 Activity: unrestricted Diet: routine Medications: Ibuprofen Condition: stable Instructions: refer to practice specific booklet Discharge to: home Follow-up Information   Follow up with [facility] [place] In 6 weeks.   Contact information:   [address], [address] [place] [address] [phone]      Newborn Data: Live born female  Birth Weight: 9 lb 6 oz (4252 g) APGAR: 8, 9  Home with mother.  Tawnya Crook 06/25/2012, 7:20 AM

## 2012-06-25 NOTE — Discharge Summary (Signed)
Attestation of Attending Supervision of Advanced Practitioner (PA/CNM/NP): Evaluation and management procedures were performed by the Advanced Practitioner under my supervision and collaboration.  I have reviewed the Advanced Practitioner's note and chart, and I agree with the management and plan.  Cheral Cappucci, MD, FACOG Attending Obstetrician & Gynecologist Faculty Practice, [facility]  

## 2012-06-26 ENCOUNTER — Ambulatory Visit: Payer: Self-pay

## 2012-06-26 NOTE — Lactation Note (Signed)
This note was copied from the chart of Elaine Murillo. Lactation Consultation Note Mom states baby fed at 10:30, and latched to the breast without using the nipple shield, mom states that feeding went well.  Baby starting to wake, offered to assist mom with a feeding, mom accepts. Attempted to latch baby but he became sleepy and did not latch.  Inst mom to call for next feeding, so LC can assess before discharge. Direct number provided.  1400: Mom called, states that baby is waking up and ready to feed. Assisted mom to get baby latched to the left using the nipple shield #24. Baby is not able to maintain his latch without the nipple shield. Using the nipple shield, baby is able to maintain a deep latch with rhythmic sucking and audible swallowing. Few drops colostrum present in the nipple shield after baby fed. Dad then fed baby 13 mL expressed breast milk via bottle.   Mom was instructed to attempt to latch baby, using the nipple shield if needed, and to offer expressed br milk via bottle after the feeding. Mom was instructed to pump at least 4 times per day while using nipple shield, and to pump any time baby does not latch for a feeding. Mom was instructed to make a follow up appt o/p in lactation office, appt made for mom. Written instructions were provided.   Enc mom to call the lactation office if she has any other concerns, and to attend the BFSG. Mom states she has no other questions at this time.   Patient Name: Elaine Canda Podgorski [id number] Date: 06/26/2012     Maternal Data    Feeding Feeding Type: Breast Milk Feeding method: Breast Length of feed: 20 min  LATCH Score/Interventions                      Lactation Tools Discussed/Used     Consult Status      Lenard Forth 06/26/2012, 2:02 PM

## 2012-06-27 ENCOUNTER — Encounter: Payer: 59 | Admitting: Obstetrics & Gynecology

## 2012-08-05 ENCOUNTER — Ambulatory Visit (INDEPENDENT_AMBULATORY_CARE_PROVIDER_SITE_OTHER): Payer: 59 | Admitting: Advanced Practice Midwife

## 2012-08-05 ENCOUNTER — Encounter: Payer: Self-pay | Admitting: Advanced Practice Midwife

## 2012-08-05 MED ORDER — NORETHINDRONE 0.35 MG PO TABS: 1.0000 | ORAL_TABLET | Freq: Every day | ORAL | Status: DC

## 2012-08-05 NOTE — Patient Instructions (Addendum)
Oral Contraception Use  Oral contraceptives (OCs) are medicines taken to prevent pregnancy. OCs work by preventing the ovaries from releasing eggs. The hormones in OCs also cause the cervical mucus to thicken, preventing the sperm from entering the uterus. The hormones also cause the uterine lining to become thin, not allowing a fertilized egg to attach to the inside of the uterus. OCs are highly effective when taken exactly as prescribed. However, OCs do not prevent sexually transmitted diseases (STDs). Safe sex practices, such as using condoms along with an OC, can help prevent STDs.   Before taking OCs, you may have a physical exam and Pap test. Your caregiver may also order blood tests if necessary. Your caregiver will make sure you are a good candidate for oral contraception. Discuss with your caregiver the possible side effects of the OC you may be prescribed. When starting an OC, it can take 2 to 3 months for the body to adjust to the changes in hormone levels in your body.   HOW TO TAKE ORAL CONTRACEPTIVES  Your caregiver may advise you on how to start taking the first cycle of OCs. Otherwise, you can:  · Start on day 1 of your menstrual period. You will not need any backup contraceptive protection with this start time.  · Start on the first Sunday after your menstrual period or the day you get your prescription. In these cases, you will need to use backup contraceptive protection for the first 7-day cycle.  After you have started taking OCs:  · If you forget to take 1 pill, take it as soon as you remember. Take the next pill at the regular time.  · If you miss 2 or more pills, use backup birth control until your next menstrual period starts.  · If you use a 28-day pack that contains inactive pills and you miss 1 of the last 7 pills (pills with no hormones), it will not matter. Throw away the rest of the non-hormone pills and start a new pill pack.  No matter which day you start the OC, you will always start  a new pack on that same day of the week. Have an extra pack of OCs and a backup contraceptive method available in case you miss some pills or lose your OC pack.  HOME CARE INSTRUCTIONS   · Do not smoke.  · Always use a condom to protect against STDs. OCs do not protect against STDs.  · Use a calendar to mark your menstrual period days.  · Read the information and directions that come with your OC. Talk to your caregiver if you have questions.  SEEK MEDICAL CARE IF:   · You develop nausea and vomiting.  · You have abnormal vaginal discharge or bleeding.  · You develop a rash.  · You miss your menstrual period.  · You are losing your hair.  · You need treatment for mood swings or depression.  · You get dizzy when taking the OC.  · You develop acne from taking the OC.  · You become pregnant.  SEEK IMMEDIATE MEDICAL CARE IF:   · You develop chest pain.  · You develop shortness of breath.  · You have an uncontrolled or severe headache.  · You develop numbness or slurred speech.  · You develop visual problems.  · You develop pain, redness, and swelling in the legs.  Document Released: 02/09/2011 Document Revised: 05/15/2011 Document Reviewed: 02/09/2011  ExitCare® Patient Information ©2014 [employer].

## 2012-08-05 NOTE — Progress Notes (Signed)
  Subjective:     Elaine Murillo is a 42 y.o. female who presents for a postpartum visit. She is 6 weeks postpartum following a spontaneous vaginal delivery. I have fully reviewed the prenatal and intrapartum course. The delivery was at 39.4 gestational weeks. Outcome: spontaneous vaginal delivery. Anesthesia: none. Postpartum course has been uneventful. Baby's course has been uneventful. Baby is feeding by breast. Bleeding no bleeding. Bowel function is normal. Bladder function is normal. Patient is sexually active. Contraception method is none. Postpartum depression screening: negative.  The following portions of the patient's history were reviewed and updated as appropriate: allergies, current medications, past family history, past medical history, past social history, past surgical history and problem list.  Review of Systems Pertinent items are noted in HPI.   Objective:    BP 156/88  Pulse 77  Resp 16  Ht 5\' 4"  (1.626 m)  Wt 89.359 kg (197 lb)  BMI 33.8 kg/m2  Breastfeeding? Yes  General:  alert and no distress   Breasts:  inspection negative, no nipple discharge or bleeding, no masses or nodularity palpable  Lungs: n/a  Heart:  regular rate and rhythm  Abdomen: soft, non-tender; bowel sounds normal; no masses,  no organomegaly   Vulva:  normal  Vagina: normal vagina, no discharge, exudate, lesion, or erythema  Cervix:  multiparous appearance and no lesions  Corpus: normal size, contour, position, consistency, mobility, non-tender  Adnexa:  normal adnexa  Rectal Exam: Not performed.        Assessment:     Normal postpartum exam. Pap smear not done at today's visit.   Plan:    1. Contraception: OCP (estrogen/progesterone) and oral progesterone-only contraceptive  May or may not start pills. Will recommend minipill until weaned 2. Warned she could get pregnant at any time if not contracepting. Pt is OK with this 3. Follow up in: 1 year or as needed.

## 2012-11-21 ENCOUNTER — Ambulatory Visit (INDEPENDENT_AMBULATORY_CARE_PROVIDER_SITE_OTHER): Payer: 59 | Admitting: Sports Medicine

## 2012-11-21 DIAGNOSIS — Z23 Encounter for immunization: Secondary | ICD-10-CM | POA: Insufficient documentation

## 2012-11-21 NOTE — Progress Notes (Signed)
I was present for all essential parts of this visit and procedure.  Yeiden Frenkel J. Jevon Shells, M.D.   

## 2012-11-21 NOTE — Assessment & Plan Note (Signed)
Flu shot given

## 2013-03-17 ENCOUNTER — Other Ambulatory Visit: Payer: Self-pay | Admitting: *Deleted

## 2013-03-17 DIAGNOSIS — IMO0001 Reserved for inherently not codable concepts without codable children: Secondary | ICD-10-CM

## 2013-03-17 MED ORDER — NORETHINDRONE 0.35 MG PO TABS: 1.0000 | ORAL_TABLET | Freq: Every day | ORAL | Status: DC

## 2013-03-17 NOTE — Telephone Encounter (Signed)
Pt request for 3 months of Micronor to be sent into her mail order pharmacy.  Insurance company now requiring that long standing RX be sent through mail order.

## 2013-12-10 ENCOUNTER — Ambulatory Visit (INDEPENDENT_AMBULATORY_CARE_PROVIDER_SITE_OTHER): Payer: 59 | Admitting: Physician Assistant

## 2013-12-10 VITALS — Temp 98.2°F

## 2013-12-10 DIAGNOSIS — Z23 Encounter for immunization: Secondary | ICD-10-CM

## 2013-12-10 NOTE — Progress Notes (Signed)
   Subjective:    Patient ID: Elaine Murillo, female    DOB: May 26, 1970, 43 y.o.   MRN: [id number]  HPI  Elaine Murillo is here for a flu vaccine.    Review of Systems     Objective:   Physical Exam        Assessment & Plan:  Patient tolerated injection well without any complications.

## 2014-01-05 ENCOUNTER — Encounter: Payer: Self-pay | Admitting: Advanced Practice Midwife

## 2014-01-28 ENCOUNTER — Encounter: Payer: Self-pay | Admitting: Physician Assistant

## 2014-01-28 ENCOUNTER — Ambulatory Visit (INDEPENDENT_AMBULATORY_CARE_PROVIDER_SITE_OTHER): Payer: 59 | Admitting: Physician Assistant

## 2014-01-28 VITALS — BP 130/85 | HR 79 | Ht 64.5 in | Wt 215.0 lb

## 2014-01-28 DIAGNOSIS — N62 Hypertrophy of breast: Secondary | ICD-10-CM

## 2014-01-28 DIAGNOSIS — Z13220 Encounter for screening for lipoid disorders: Secondary | ICD-10-CM

## 2014-01-28 DIAGNOSIS — Z30011 Encounter for initial prescription of contraceptive pills: Secondary | ICD-10-CM

## 2014-01-28 DIAGNOSIS — Z0000 Encounter for general adult medical examination without abnormal findings: Secondary | ICD-10-CM

## 2014-01-28 DIAGNOSIS — R635 Abnormal weight gain: Secondary | ICD-10-CM

## 2014-01-28 DIAGNOSIS — Z1239 Encounter for other screening for malignant neoplasm of breast: Secondary | ICD-10-CM

## 2014-01-28 DIAGNOSIS — E669 Obesity, unspecified: Secondary | ICD-10-CM

## 2014-01-28 DIAGNOSIS — Z131 Encounter for screening for diabetes mellitus: Secondary | ICD-10-CM

## 2014-01-28 MED ORDER — NORGESTIM-ETH ESTRAD TRIPHASIC 0.18/0.215/0.25 MG-35 MCG PO TABS: 1.0000 | ORAL_TABLET | Freq: Every day | ORAL | Status: DC

## 2014-01-28 MED ORDER — PHENTERMINE HCL 37.5 MG PO TABS: 37.5000 mg | ORAL_TABLET | Freq: Every day | ORAL | Status: DC

## 2014-01-28 NOTE — Progress Notes (Signed)
Subjective:    Patient ID: Elaine Murillo, female    DOB: 1970/10/27, 43 y.o.   MRN: [id number]  HPI    Review of Systems     Objective:   Physical Exam        Assessment & Plan:   Subjective:     Elaine Murillo is a 43 y.o. female and is here for a comprehensive physical exam. The patient reports problems - concerned with weight. Patient has 2 children and has continued to gain weight. She feels like she's gained another 15 pounds in the last 6 months. She does exercise but admits to not exercising as much over the last couple months. She has not tried anything for weight management. She also has really large breasts that make it difficult for her to exercise. They also cause some back strain and difficulty performing some exercises. she currently wears a 76 G.   History   Social History  . Marital Status: Married    Spouse Name: N/A    Number of Children: N/A  . Years of Education: N/A   Occupational History  . homemaker    Social History Main Topics  . Smoking status: Never Smoker   . Smokeless tobacco: Never Used  . Alcohol Use: No  . Drug Use: No  . Sexual Activity:    Partners: Male   Other Topics Concern  . Not on file   Social History Narrative   Health Maintenance  Topic Date Due  . INFLUENZA VACCINE  10/05/2014  . PAP SMEAR  11/20/2014  . TETANUS/TDAP  04/10/2022    The following portions of the patient's history were reviewed and updated as appropriate: allergies, current medications, past family history, past medical history, past social history, past surgical history and problem list.  Review of Systems A comprehensive review of systems was negative.   Objective:    BP 130/85 mmHg  Pulse 79  Ht 5' 4.5" (1.638 m)  Wt 215 lb (97.523 kg)  BMI 36.35 kg/m2  LMP 01/07/2014 (Approximate)  Breastfeeding? No General appearance: alert, cooperative, appears stated age and moderately obese Head: Normocephalic, without obvious abnormality,  atraumatic Eyes: conjunctivae/corneas clear. PERRL, EOM's intact. Fundi benign. Ears: normal TM's and external ear canals both ears Nose: Nares normal. Septum midline. Mucosa normal. No drainage or sinus tenderness. Throat: lips, mucosa, and tongue normal; teeth and gums normal Neck: no adenopathy, no carotid bruit, no JVD, supple, symmetrical, trachea midline and thyroid not enlarged, symmetric, no tenderness/mass/nodules Back: symmetric, no curvature. ROM normal. No CVA tenderness. Lungs: clear to auscultation bilaterally Heart: regular rate and rhythm, S1, S2 normal, no murmur, click, rub or gallop Abdomen: soft, non-tender; bowel sounds normal; no masses,  no organomegaly Extremities: extremities normal, atraumatic, no cyanosis or edema Pulses: 2+ and symmetric Skin: Skin color, texture, turgor normal. No rashes or lesions Lymph nodes: Cervical, supraclavicular, and axillary nodes normal. Neurologic: Grossly normal    Assessment:    Healthy female exam.      Plan:    CPE- pap smear up to date. Still encouraging yearly GYN appt. Stopped micrnor changed to combination pill. Fasting labs given. Discussed calcium 1200mg  and vitamin d 800units. Will order mammogram. Up to date on vaccines.   Large breast- would like to make referral for consulation of breast reduction. i do think pt would benefit.   Obesity/abnormal weight gain- gave phentermine. Discussed SE. Follow up in one month. Discussed calories 1500 daily and regular exercise at least 150 minutes.  See  After Visit Summary for Counseling Recommendations

## 2014-01-28 NOTE — Patient Instructions (Addendum)
Mammogram to be ordered.   Keeping You Healthy  Get These Tests 1. Blood Pressure- Have your blood pressure checked once a year by your health care provider.  Normal blood pressure is 120/80. 2. Weight- Have your body mass index (BMI) calculated to screen for obesity.  BMI is measure of body fat based on height and weight.  You can also calculate your own BMI at GravelBags.it. 3. Cholesterol- Have your cholesterol checked every 5 years starting at age 43 then yearly starting at age 51. 8. Chlamydia, HIV, and other sexually transmitted diseases- Get screened every year until age 43, then within three months of each new sexual provider. 5. Pap Smear- Every 1-3 years; discuss with your health care provider. 6. Mammogram- Every year starting at age 43  Take these medicines  Calcium with Vitamin D-Your body needs 1200 mg of Calcium each day and [phone] IU of Vitamin D daily.  Your body can only absorb 500 mg of Calcium at a time so Calcium must be taken in 2 or 3 divided doses throughout the day.  Multivitamin with folic acid- Once daily if it is possible for you to become pregnant.  Get these Immunizations  Gardasil-Series of three doses; prevents HPV related illness such as genital warts and cervical cancer.  Menactra-Single dose; prevents meningitis.  Tetanus shot- Every 10 years.  Flu shot-Every year.  Take these steps 1. Do not smoke-Your healthcare provider can help you quit.  For tips on how to quit go to www.smokefree.gov or call 1-800 QUITNOW. 2. Be physically active- Exercise 5 days a week for at least 30 minutes.  If you are not already physically active, start slow and gradually work up to 30 minutes of moderate physical activity.  Examples of moderate activity include walking briskly, dancing, swimming, bicycling, etc. 3. Breast Cancer- A self breast exam every month is important for early detection of breast cancer.  For more information and instruction on self  breast exams, ask your healthcare provider or https://www.patel.info/. 4. Eat a healthy diet- Eat a variety of healthy foods such as fruits, vegetables, whole grains, low fat milk, low fat cheeses, yogurt, lean meats, poultry and fish, beans, nuts, tofu, etc.  For more information go to www. Thenutritionsource.org 5. Drink alcohol in moderation- Limit alcohol intake to one drink or less per day. Never drink and drive. 6. Depression- Your emotional health is as important as your physical health.  If you're feeling down or losing interest in things you normally enjoy please talk to your healthcare provider about being screened for depression. 7. Dental visit- Brush and floss your teeth twice daily; visit your dentist twice a year. 8. Eye doctor- Get an eye exam at least every 2 years. 9. Helmet use- Always wear a helmet when riding a bicycle, motorcycle, rollerblading or skateboarding. 39. Safe sex- If you may be exposed to sexually transmitted infections, use a condom. 11. Seat belts- Seat belts can save your live; always wear one. 12. Smoke/Carbon Monoxide detectors- These detectors need to be installed on the appropriate level of your home. Replace batteries at least once a year. 13. Skin cancer- When out in the sun please cover up and use sunscreen 15 SPF or higher. 14. Violence- If anyone is threatening or hurting you, please tell your healthcare provider.

## 2014-02-03 ENCOUNTER — Encounter: Payer: Self-pay | Admitting: Physician Assistant

## 2014-02-03 DIAGNOSIS — E781 Pure hyperglyceridemia: Secondary | ICD-10-CM | POA: Insufficient documentation

## 2014-02-03 DIAGNOSIS — E559 Vitamin D deficiency, unspecified: Secondary | ICD-10-CM | POA: Insufficient documentation

## 2014-02-03 LAB — LIPID PANEL
CHOLESTEROL: 193 mg/dL (ref 0–200)
HDL: 47 mg/dL (ref >39)
LDL Cholesterol: 113 mg/dL — ABNORMAL HIGH (ref 0–99)
Total CHOL/HDL Ratio: 4.1 Ratio
Triglycerides: 166 mg/dL — ABNORMAL HIGH (ref <150)
VLDL: 33 mg/dL (ref 0–40)

## 2014-02-03 LAB — COMPLETE METABOLIC PANEL WITH GFR
ALBUMIN: 4.2 g/dL (ref 3.5–5.2)
ALK PHOS: 56 U/L (ref 39–117)
ALT: 12 U/L (ref 0–35)
AST: 14 U/L (ref 0–37)
BUN: 11 mg/dL (ref 6–23)
CALCIUM: 9.3 mg/dL (ref 8.4–10.5)
CHLORIDE: 103 meq/L (ref 96–112)
CO2: 24 meq/L (ref 19–32)
Creat: 0.85 mg/dL (ref 0.50–1.10)
GFR, EST NON AFRICAN AMERICAN: 84 mL/min
GLUCOSE: 85 mg/dL (ref 70–99)
POTASSIUM: 4.7 meq/L (ref 3.5–5.3)
SODIUM: 137 meq/L (ref 135–145)
TOTAL PROTEIN: 6.8 g/dL (ref 6.0–8.3)
Total Bilirubin: 0.6 mg/dL (ref 0.2–1.2)

## 2014-02-03 LAB — VITAMIN B12: Vitamin B-12: 583 pg/mL (ref 211–911)

## 2014-02-03 LAB — TSH: TSH: 1.944 u[IU]/mL (ref 0.350–4.500)

## 2014-02-03 LAB — VITAMIN D 25 HYDROXY (VIT D DEFICIENCY, FRACTURES): Vit D, 25-Hydroxy: 28 ng/mL — ABNORMAL LOW (ref 30–100)

## 2014-02-10 ENCOUNTER — Other Ambulatory Visit: Payer: Self-pay

## 2014-02-10 MED ORDER — NORGESTIM-ETH ESTRAD TRIPHASIC 0.18/0.215/0.25 MG-35 MCG PO TABS: 1.0000 | ORAL_TABLET | Freq: Every day | ORAL | Status: DC

## 2014-02-11 ENCOUNTER — Ambulatory Visit (INDEPENDENT_AMBULATORY_CARE_PROVIDER_SITE_OTHER): Payer: 59

## 2014-02-11 DIAGNOSIS — Z1231 Encounter for screening mammogram for malignant neoplasm of breast: Secondary | ICD-10-CM

## 2014-02-16 ENCOUNTER — Encounter: Payer: Self-pay | Admitting: Physician Assistant

## 2014-02-16 ENCOUNTER — Ambulatory Visit (INDEPENDENT_AMBULATORY_CARE_PROVIDER_SITE_OTHER): Payer: 59 | Admitting: Physician Assistant

## 2014-02-16 VITALS — BP 133/80 | HR 86 | Ht 64.0 in | Wt 210.0 lb

## 2014-02-16 DIAGNOSIS — M5441 Lumbago with sciatica, right side: Secondary | ICD-10-CM

## 2014-02-16 DIAGNOSIS — R635 Abnormal weight gain: Secondary | ICD-10-CM

## 2014-02-16 DIAGNOSIS — E669 Obesity, unspecified: Secondary | ICD-10-CM

## 2014-02-16 MED ORDER — PHENTERMINE HCL 37.5 MG PO TABS: 37.5000 mg | ORAL_TABLET | Freq: Every day | ORAL | Status: DC

## 2014-02-16 NOTE — Progress Notes (Signed)
   Subjective:    Patient ID: Elaine Murillo, female    DOB: 1970/10/10, 43 y.o.   MRN: [id number]  HPI Patient is a 43 year old female who presents to the clinic to follow-up on start phentermine for weight loss. She denies any insomnia, palpitations, dry mouth or side effects. She is up to a full tablet and certainly noticing decreased hunger. She is trying to keep to a 1500-calorie diet. She still has frequent cravings for sweets. She is exercising 3-4 times a week at the [facility].  She does also report some episodic low back pain mostly on the right but occasionally runs down her leg. It was bad last night that has improved this morning. She denies any trauma or injury.   Review of Systems  All other systems reviewed and are negative.      Objective:   Physical Exam  Constitutional: She appears well-developed and well-nourished.  HENT:  Head: Normocephalic and atraumatic.  Cardiovascular: Normal rate, regular rhythm and normal heart sounds.   Pulmonary/Chest: Effort normal and breath sounds normal.  Skin: Skin is dry.  Psychiatric: She has a normal mood and affect. Her behavior is normal.          Assessment & Plan:  Abnormal weight gain/obesity-patient has lost 5 pounds in 2 weeks. She comes in early because Alyson Ingles be out of town when she will need a refill. I did give her a refill of phentermine with a postdated the day she could get filled. Reiterated side effects. Follow-up in another 6 weeks with weight check. Nurse visit.   Low back pain-gave patient some low back exercises to start. Encouraged ibuprofen as needed. He can also affect to relax muscles. If pain continues, office for full workup.

## 2014-02-16 NOTE — Patient Instructions (Signed)

## 2014-11-25 ENCOUNTER — Ambulatory Visit (INDEPENDENT_AMBULATORY_CARE_PROVIDER_SITE_OTHER): Payer: 59 | Admitting: Obstetrics & Gynecology

## 2014-11-25 ENCOUNTER — Encounter: Payer: Self-pay | Admitting: Obstetrics & Gynecology

## 2014-11-25 VITALS — BP 126/74 | HR 81 | Resp 16 | Ht 64.0 in | Wt 208.0 lb

## 2014-11-25 DIAGNOSIS — Z3041 Encounter for surveillance of contraceptive pills: Secondary | ICD-10-CM

## 2014-11-25 DIAGNOSIS — Z0000 Encounter for general adult medical examination without abnormal findings: Secondary | ICD-10-CM

## 2014-11-25 DIAGNOSIS — Z124 Encounter for screening for malignant neoplasm of cervix: Secondary | ICD-10-CM | POA: Diagnosis not present

## 2014-11-25 DIAGNOSIS — Z01419 Encounter for gynecological examination (general) (routine) without abnormal findings: Secondary | ICD-10-CM

## 2014-11-25 DIAGNOSIS — Z23 Encounter for immunization: Secondary | ICD-10-CM | POA: Diagnosis not present

## 2014-11-25 DIAGNOSIS — Z1151 Encounter for screening for human papillomavirus (HPV): Secondary | ICD-10-CM | POA: Diagnosis not present

## 2014-11-25 MED ORDER — INFLUENZA VAC SPLIT QUAD 0.5 ML IM SUSY
0.5000 mL | PREFILLED_SYRINGE | Freq: Once | INTRAMUSCULAR | Status: AC
  Administered 2014-11-25: 0.5 mL via INTRAMUSCULAR

## 2014-11-25 MED ORDER — LEVONORGEST-ETH ESTRAD 91-DAY 0.15-0.03 &0.01 MG PO TABS: 1.0000 | ORAL_TABLET | Freq: Every day | ORAL | Status: DC

## 2014-11-25 NOTE — Addendum Note (Signed)
Addended by: Asencion Islam on: 11/25/2014 09:55 AM   Modules accepted: Orders

## 2014-11-25 NOTE — Progress Notes (Signed)
Subjective:    Elaine Murillo is a 44 y.o. MW P2 (38 yo and 2 yo kids) female who presents for an annual exam. The patient has no complaints today except some PMS symptoms.  The patient is sexually active. GYN screening history: last pap: was normal. The patient wears seatbelts: yes. The patient participates in regular exercise: yes. Has the patient ever been transfused or tattooed?: no. The patient reports that there is not domestic violence in her life.   Menstrual History: OB History    Gravida Para Term Preterm AB TAB SAB Ectopic Multiple Living   2 2 2       2       Menarche age: 12  Patient's last menstrual period was 11/17/2014.    The following portions of the patient's history were reviewed and updated as appropriate: allergies, current medications, past family history, past medical history, past social history, past surgical history and problem list.  Review of Systems A comprehensive review of systems was negative. 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 Denies dyspareunia.   Objective:    BP 126/74 mmHg  Pulse 81  Resp 16  Ht 5\' 4"  (1.626 m)  Wt 208 lb (94.348 kg)  BMI 35.69 kg/m2  LMP 11/17/2014  General Appearance:    Alert, cooperative, no distress, appears stated age  Head:    Normocephalic, without obvious abnormality, atraumatic  Eyes:    PERRL, conjunctiva/corneas clear, EOM's intact, fundi    benign, both eyes  Ears:    Normal TM's and external ear canals, both ears  Nose:   Nares normal, septum midline, mucosa normal, no drainage    or sinus tenderness  Throat:   Lips, mucosa, and tongue normal; teeth and gums normal  Neck:   Supple, symmetrical, trachea midline, no adenopathy;    thyroid:  no enlargement/tenderness/nodules; no carotid   bruit or JVD  Back:     Symmetric, no curvature, ROM normal, no CVA tenderness  Lungs:     Clear to auscultation bilaterally, respirations unlabored  Chest Wall:    No tenderness or deformity   Heart:    Regular rate and rhythm,  S1 and S2 normal, no murmur, rub   or gallop  Breast Exam:    No tenderness, masses, or nipple abnormality  Abdomen:     Soft, non-tender, bowel sounds active all four quadrants,    no masses, no organomegaly  Genitalia:    Normal female without lesion, discharge or tenderness, NSSA, NT, mobile, normal adnexal exam     Extremities:   Extremities normal, atraumatic, no cyanosis or edema  Pulses:   2+ and symmetric all extremities  Skin:   Skin color, texture, turgor normal, no rashes or lesions  Lymph nodes:   Cervical, supraclavicular, and axillary nodes normal  Neurologic:   CNII-XII intact, normal strength, sensation and reflexes    throughout  .    Assessment:    Healthy female exam.    Plan:     Breast self exam technique reviewed and patient encouraged to perform self-exam monthly. Mammogram. Thin prep Pap smear. with cotesting Fasting labs at her convenience Flu vaccine Mammogram in 12/16 Change OCPs to Camrese

## 2014-11-26 LAB — CYTOLOGY - PAP

## 2014-12-03 ENCOUNTER — Other Ambulatory Visit: Payer: 59

## 2014-12-04 LAB — COMPREHENSIVE METABOLIC PANEL
ALK PHOS: 49 U/L (ref 33–115)
ALT: 11 U/L (ref 6–29)
AST: 14 U/L (ref 10–30)
Albumin: 3.9 g/dL (ref 3.6–5.1)
BILIRUBIN TOTAL: 0.4 mg/dL (ref 0.2–1.2)
BUN: 8 mg/dL (ref 7–25)
CO2: 27 mmol/L (ref 20–31)
Calcium: 8.9 mg/dL (ref 8.6–10.2)
Chloride: 105 mmol/L (ref 98–110)
Creat: 0.79 mg/dL (ref 0.50–1.10)
Glucose, Bld: 83 mg/dL (ref 65–99)
POTASSIUM: 4.4 mmol/L (ref 3.5–5.3)
Sodium: 138 mmol/L (ref 135–146)
TOTAL PROTEIN: 6.5 g/dL (ref 6.1–8.1)

## 2014-12-04 LAB — CBC
HEMATOCRIT: 41.4 % (ref 36.0–46.0)
Hemoglobin: 13.8 g/dL (ref 12.0–15.0)
MCH: 29.3 pg (ref 26.0–34.0)
MCHC: 33.3 g/dL (ref 30.0–36.0)
MCV: 87.9 fL (ref 78.0–100.0)
MPV: 9.6 fL (ref 8.6–12.4)
PLATELETS: 288 10*3/uL (ref 150–400)
RBC: 4.71 MIL/uL (ref 3.87–5.11)
RDW: 13.6 % (ref 11.5–15.5)
WBC: 9.3 10*3/uL (ref 4.0–10.5)

## 2014-12-04 LAB — LIPID PANEL
CHOLESTEROL: 182 mg/dL (ref 125–200)
HDL: 47 mg/dL (ref >=46)
LDL Cholesterol: 85 mg/dL (ref <130)
Total CHOL/HDL Ratio: 3.9 Ratio (ref <=5.0)
Triglycerides: 252 mg/dL — ABNORMAL HIGH (ref <150)
VLDL: 50 mg/dL — AB (ref <30)

## 2014-12-04 LAB — TSH: TSH: 1.959 u[IU]/mL (ref 0.350–4.500)

## 2014-12-07 ENCOUNTER — Telehealth: Payer: Self-pay

## 2014-12-07 ENCOUNTER — Telehealth: Payer: Self-pay | Admitting: *Deleted

## 2014-12-07 NOTE — Telephone Encounter (Signed)
Please see lipid panel.

## 2014-12-07 NOTE — Telephone Encounter (Signed)
Pt notified of abnormal triglycerides and pt informed to call Iran Planas NP to discuss management of triglycerides.

## 2014-12-07 NOTE — Telephone Encounter (Signed)
LDL looks great. TG still elevated. Needs to be on fish oil 4000mg  daily. Recheck in 6 months.

## 2014-12-08 NOTE — Telephone Encounter (Signed)
Left message advising of results and recommendations.  

## 2014-12-22 ENCOUNTER — Encounter: Payer: Self-pay | Admitting: Osteopathic Medicine

## 2014-12-22 ENCOUNTER — Telehealth: Payer: Self-pay | Admitting: Obstetrics & Gynecology

## 2014-12-22 ENCOUNTER — Telehealth: Payer: Self-pay | Admitting: *Deleted

## 2014-12-22 ENCOUNTER — Ambulatory Visit (INDEPENDENT_AMBULATORY_CARE_PROVIDER_SITE_OTHER): Payer: 59 | Admitting: Osteopathic Medicine

## 2014-12-22 VITALS — BP 146/91 | HR 73 | Wt 208.0 lb

## 2014-12-22 DIAGNOSIS — R21 Rash and other nonspecific skin eruption: Secondary | ICD-10-CM

## 2014-12-22 MED ORDER — CICLOPIROX OLAMINE 0.77 % EX CREA: TOPICAL_CREAM | Freq: Two times a day (BID) | CUTANEOUS | Status: DC

## 2014-12-22 NOTE — Patient Instructions (Signed)
Rash does not appear dangerous, but if it still present after few weeks of prescription treatment and if still bothering you, would recommend biopsy to confirm diagnosis.

## 2014-12-22 NOTE — Telephone Encounter (Signed)
-----   Message from Francia Greaves sent at 12/22/2014 10:35 AM EDT ----- Regarding: RX Question Contact: [phone] Patient called this morning with questions about a birth control pill she was just prescribed. She states she was suppose to take the pill at the beginning of her cycle. Her cycle just ended, her question is should she go ahead and start the pill or wait until her next cycle. Would like a call back

## 2014-12-22 NOTE — Telephone Encounter (Signed)
Called pt, no answer, left message for pt to call office.

## 2014-12-22 NOTE — Telephone Encounter (Signed)
-----   Message from Francia Greaves sent at 12/22/2014 10:35 AM EDT ----- Regarding: RX Question Contact: [phone] Patient called this morning with questions about a birth control pill she was just prescribed. She states she was suppose to take the pill at the beginning of her cycle. Her cycle just ended, her question is should she go ahead and start the pill or wait until her next cycle. Would like a call back

## 2014-12-22 NOTE — Progress Notes (Signed)
HPI: Elaine Murillo is a 45 y.o. female who presents to [facility]  today for chief complaint of:  Chief Complaint  Patient presents with  . Acute Visit    rash?? ring worm    . Location: L breast . Quality: itching, redness . Severity: mild . Duration: 2 months . Context: thinks might be ringworm, treated with OTC antifungals with some relief of redness but no resolution . Assoc signs/symptoms: no fever/chills, no breast/nipple changes or drainage.    Past medical, social and family history reviewed: Past Medical History  Diagnosis Date  . Uterine fibroids affecting pregnancy   . Anxiety    Past Surgical History  Procedure Laterality Date  . Tonsillectomy     Social History  Substance Use Topics  . Smoking status: Never Smoker   . Smokeless tobacco: Never Used  . Alcohol Use: No   Family History  Problem Relation Age of Onset  . Stroke Paternal Grandmother     Current Outpatient Prescriptions  Medication Sig Dispense Refill  . Levonorgestrel-Ethinyl Estradiol (AMETHIA,CAMRESE) 0.15-0.03 &0.01 MG tablet Take 1 tablet by mouth daily. 1 Package 6  . phentermine (ADIPEX-P) 37.5 MG tablet Take 1 tablet (37.5 mg total) by mouth daily before breakfast. Do not refill until 03/05/14. (Patient not taking: Reported on 11/25/2014) 30 tablet 0   No current facility-administered medications for this visit.   No Known Allergies    Review of Systems: CONSTITUTIONAL: Neg fever/chills, no unintentional weight changes CARDIAC: No chest pain/pressure/palpitations RESPIRATORY: No cough/shortness of breath SKIN: No rash/wounds/concerning lesions other than breast rash as per HPI HEM/ONC: No easy bruising/bleeding, no abnormal lymph node    Exam:  BP 146/91 mmHg  Pulse 73  Wt 208 lb (94.348 kg)  LMP 11/17/2014 Constitutional: VSS, see above. General Appearance: alert, well-developed, well-nourished, NAD Breasts: breasts appear normal, no  suspicious masses, no skin or nipple changes c/w cancer, no axillary nodes. $cm diameter mild erythema area on L breast at 7:00 position, nontender, no warmth, small hemangioma   No results found for this or any previous visit (from the past 66 hour(s)).    ASSESSMENT/PLAN:  Rash and nonspecific skin eruption - Plan: ciclopirox (LOPROX) 0.77 % cream   Possible slow to resolve tinea, not consistent with mastitis or candida. Possibly hemagioma/abnormal vascular pattern in this area but since irritating her will trial different medication however advised if no improvement in the next 2 weeks would recommend biopsy for definitive diagnosis but I am reassured no malignancy based on exam, reviewed mammo results and normal 02/2014, repeat mammo upcoming 02/2015

## 2014-12-22 NOTE — Telephone Encounter (Signed)
Spoke to pt, stated that she had already started her birth control pill pack and had taken the third one under the Tuesday slot.  Informed pt that it would have been fine to start at the beginning of the pill pack but just to continue taking where she was at this time.  Also informed pt to use back up protection for the first month.  Pt acknowledged instructions.

## 2015-02-08 ENCOUNTER — Encounter: Payer: Self-pay | Admitting: Physician Assistant

## 2015-02-08 ENCOUNTER — Ambulatory Visit (INDEPENDENT_AMBULATORY_CARE_PROVIDER_SITE_OTHER): Payer: 59 | Admitting: Physician Assistant

## 2015-02-08 VITALS — BP 132/84 | HR 75 | Ht 64.0 in | Wt 208.0 lb

## 2015-02-08 DIAGNOSIS — M7742 Metatarsalgia, left foot: Secondary | ICD-10-CM

## 2015-02-08 DIAGNOSIS — Z6836 Body mass index (BMI) 36.0-36.9, adult: Secondary | ICD-10-CM

## 2015-02-08 DIAGNOSIS — M2012 Hallux valgus (acquired), left foot: Secondary | ICD-10-CM | POA: Insufficient documentation

## 2015-02-08 DIAGNOSIS — E6609 Other obesity due to excess calories: Secondary | ICD-10-CM | POA: Insufficient documentation

## 2015-02-08 DIAGNOSIS — Z6838 Body mass index (BMI) 38.0-38.9, adult: Secondary | ICD-10-CM | POA: Insufficient documentation

## 2015-02-08 DIAGNOSIS — E781 Pure hyperglyceridemia: Secondary | ICD-10-CM | POA: Diagnosis not present

## 2015-02-08 DIAGNOSIS — E669 Obesity, unspecified: Secondary | ICD-10-CM

## 2015-02-08 DIAGNOSIS — R21 Rash and other nonspecific skin eruption: Secondary | ICD-10-CM

## 2015-02-08 DIAGNOSIS — Z0000 Encounter for general adult medical examination without abnormal findings: Secondary | ICD-10-CM | POA: Diagnosis not present

## 2015-02-08 MED ORDER — LORCASERIN HCL 10 MG PO TABS: ORAL_TABLET | ORAL | Status: DC

## 2015-02-08 NOTE — Patient Instructions (Signed)

## 2015-02-08 NOTE — Progress Notes (Signed)
Subjective:    Patient ID: Elaine Murillo, female    DOB: 18-Apr-1970, 44 y.o.   MRN: [id number]  HPI Dull itch absoulteing nothing. July lamasil used started to go away.   Review of Systems     Objective:   Physical Exam        Assessment & Plan:   Subjective:     Elaine Murillo is a 44 y.o. female and is here for a comprehensive physical exam. The patient reports problems - she has a rash on her left breast that has been present since this summer. she has used OTC lamisil and seen Dr. Sheppard Coil and given antiifungal. the is rash is still present and itchy at times. does not appear to have gotten worse. .  Social History   Social History  . Marital Status: Married    Spouse Name: N/A  . Number of Children: N/A  . Years of Education: N/A   Occupational History  . homemaker    Social History Main Topics  . Smoking status: Never Smoker   . Smokeless tobacco: Never Used  . Alcohol Use: No  . Drug Use: No  . Sexual Activity:    Partners: Male    Birth Control/ Protection: Pill   Other Topics Concern  . Not on file   Social History Narrative   Health Maintenance  Topic Date Due  . MAMMOGRAM  02/12/2015  . INFLUENZA VACCINE  10/05/2015  . PAP SMEAR  11/24/2017  . TETANUS/TDAP  04/10/2022  . HIV Screening  Completed    The following portions of the patient's history were reviewed and updated as appropriate: allergies, current medications, past family history, past medical history, past social history, past surgical history and problem list.  Review of Systems Pertinent items noted in HPI and remainder of comprehensive ROS otherwise negative.   Objective:    BP 132/84 mmHg  Pulse 75  Ht 5\' 4"  (1.626 m)  Wt 208 lb (94.348 kg)  BMI 35.69 kg/m2 General appearance: alert, cooperative, appears stated age and moderately obese Head: Normocephalic, without obvious abnormality, atraumatic Eyes: conjunctivae/corneas clear. PERRL, EOM's intact. Fundi benign. Ears:  normal TM's and external ear canals both ears Nose: Nares normal. Septum midline. Mucosa normal. No drainage or sinus tenderness. Throat: lips, mucosa, and tongue normal; teeth and gums normal Neck: no adenopathy, no carotid bruit, no JVD, supple, symmetrical, trachea midline and thyroid not enlarged, symmetric, no tenderness/mass/nodules Back: symmetric, no curvature. ROM normal. No CVA tenderness. Lungs: clear to auscultation bilaterally Breasts: normal appearance, no masses or tenderness very large bilateral breast. Left breast has a spot of macular erythema but no scales or lesions. Almost not visible.  Heart: regular rate and rhythm, S1, S2 normal, no murmur, click, rub or gallop Abdomen: soft, non-tender; bowel sounds normal; no masses,  no organomegaly Extremities: extremities normal, atraumatic, no cyanosis or edema pain to palpation under 2nd toe of left. Left hallux valgus.  Pulses: 2+ and symmetric Skin: Skin color, texture, turgor normal. No rashes or lesions Lymph nodes: Cervical, supraclavicular, and axillary nodes normal. Neurologic: Grossly normal    Assessment:    Healthy female exam.      Plan:    CPE- discussed labs with patient. She had GYN visit a few months back. Encouraged vitamin D 800units and calcium 1500mg . Encouraged weight loss.   Hypertriglyceridemia- discussed decrease in sugars and carbs. Add fish oil 4000mg  and continue to exercise. Recheck in 6 months.   Rash- not cleared by antifungals and  does not appear to be atopic dermatitis. Discussed she could try a steroid on this rash. If makes worse stop and could me this is a fungal infection that just has not gone away. Offer to biopsy this today. She wants to go to the dermatologist. Will make referral.  Hallux valgus and metatarsalgia of left foot-referred to sports medicine here in our office to manage. Encouraged patient to wear loose fitting shoes with support. I do think she will be a candidate for  orthotics. See After Visit Summary for Counseling Recommendations

## 2015-02-11 ENCOUNTER — Telehealth: Payer: Self-pay | Admitting: Physician Assistant

## 2015-02-11 NOTE — Telephone Encounter (Signed)
Received fax for prior authorization on [employer] sent through cover my meds waiting on authorization. - CF

## 2015-02-15 ENCOUNTER — Encounter: Payer: Self-pay | Admitting: Family Medicine

## 2015-02-15 ENCOUNTER — Ambulatory Visit (INDEPENDENT_AMBULATORY_CARE_PROVIDER_SITE_OTHER): Payer: 59 | Admitting: Family Medicine

## 2015-02-15 VITALS — BP 136/82 | HR 77 | Wt 210.0 lb

## 2015-02-15 DIAGNOSIS — M2012 Hallux valgus (acquired), left foot: Secondary | ICD-10-CM

## 2015-02-15 DIAGNOSIS — M7742 Metatarsalgia, left foot: Secondary | ICD-10-CM | POA: Diagnosis not present

## 2015-02-15 NOTE — Assessment & Plan Note (Addendum)
Exacerbated by pronation and weak hip abductors. Patient improved with scaphoid pads. Plan for hip abduction strength scaphoid pads and relative rest. Return in a month or so. Consider orthotics as needed.

## 2015-02-15 NOTE — Patient Instructions (Signed)
Thank you for coming in today.  Foot: 1) Use the medium scaphoid pads in your shoes.  2) Use the 2nd row of laces.  3) Dont be afraid to sup the shoe at the bunion.   Hip: Work on hip abduction strength.  1) Side leg raises slightly to the back. 3 sets of 30 reps both sides.  2) Pilates arch plank position. Work on Warden/ranger. Keep your pelvis level. Go slow and focus.   Return in 1 month.  Bring shoes and shorts.   Bunion (Hallux Valgus) A bony bump (protrusion) on the inside of the foot, at the base of the first toe, is called a bunion (hallux valgus). A bunion causes the first toe to angle toward the other toes. SYMPTOMS   A bony bump on the inside of the foot, causing an outward turning of the first toe. It may also overlap the second toe.  Thickening of the skin (callus) over the bony bump.  Fluid buildup under the callus. Fluid may become red, tender, and swollen (inflamed) with constant irritation or pressure.  Foot pain and stiffness. CAUSES  Many causes exist, including:  Inherited from your family (genetics).  Injury (trauma) forcing the first toe into a position in which it overlaps other toes.  Bunions are also associated with wearing shoes that have a narrow toe box (pointy shoes). RISK INCREASES WITH:  Family history of foot abnormalities, especially bunions.  Arthritis.  Narrow shoes, especially high heels. PREVENTION  Wear shoes with a wide toe box.  Avoid shoes with high heels.  Wear a small pad between the big toe and second toe.  Maintain proper conditioning:  Foot and ankle flexibility.  Muscle strength and endurance. PROGNOSIS  With proper treatment, bunions can typically be cured. Occasionally, surgery is required.  RELATED COMPLICATIONS   Infection of the bunion.  Arthritis of the first toe.  Risks of surgery, including infection, bleeding, injury to nerves (numb toe), recurrent bunion, overcorrection (toe points inward), arthritis of  the big toe, big toe pointing upward, and bone not healing. TREATMENT  Treatment first consists of stopping the activities that aggravate the pain, taking pain medicines, and icing to reduce inflammation and pain. Wear shoes with a wide toe box. Shoes can be modified by a shoe repair person to relieve pressure on the bunion, especially if you cannot find shoes with a wide enough toe box. You may also place a pad with the center cut out in your shoe, to reduce pressure on the bunion. Sometimes, an arch support (orthotic) may reduce pressure on the bunion and alleviate the symptoms. Stretching and strengthening exercises for the muscles of the foot may be useful. You may choose to wear a brace or pad at night to hold the big toe away from the second toe. If non-surgical treatments are not successful, surgery may be needed. Surgery involves removing the overgrown tissue and correcting the position of the first toe, by realigning the bones. Bunion surgery is typically performed on an outpatient basis, meaning you can go home the same day as surgery. The surgery may involve cutting the mid portion of the bone of the first toe, or just cutting and repairing (reconstructing) the ligaments and soft tissues around the first toe.  MEDICATION   If pain medicine is needed, nonsteroidal anti-inflammatory medicines, such as aspirin and ibuprofen, or other minor pain relievers, such as acetaminophen, are often recommended.  Do not take pain medicine for 7 days before surgery.  Prescription pain  relievers are usually only prescribed after surgery. Use only as directed and only as much as you need.  Ointments applied to the skin may be helpful. HEAT AND COLD  Cold treatment (icing) relieves pain and reduces inflammation. Cold treatment should be applied for 10 to 15 minutes every 2 to 3 hours for inflammation and pain and immediately after any activity that aggravates your symptoms. Use ice packs or an ice  massage.  Heat treatment may be used prior to performing the stretching and strengthening activities prescribed by your caregiver, physical therapist, or athletic trainer. Use a heat pack or a warm soak. SEEK MEDICAL CARE IF:   Symptoms get worse or do not improve in 2 weeks, despite treatment.  After surgery, you develop fever, increasing pain, redness, swelling, drainage of fluids, bleeding, or increasing warmth around the surgical area.  New, unexplained symptoms develop. (Drugs used in treatment may produce side effects.)   This information is not intended to replace advice given to you by your health care provider. Make sure you discuss any questions you have with your health care provider.   Document Released: 02/20/2005 Document Revised: 05/15/2011 Document Reviewed: 06/04/2008 Elsevier Interactive Patient Education [employer].

## 2015-02-15 NOTE — Progress Notes (Signed)
   Subjective:    I'm seeing this patient as a consultation for:  Iran Planas PA-C  CC: Bunion and foot pain  HPI: Foot pain and bunions. Patient has bunions bilaterally left worse than right. This is been ongoing for years. Her mother also had bunions and ultimately had some foot surgery for it. She notes the first MTP and the left foot is painful and does not fitted shoes easily. Her pain is worse when she does an aerobics class. She lives a class but it's hurting her foot. She's tried multiple different over-the-counter insoles shoes which have not helped much. She also has some pain near her second metatarsal. She denies any radiating pain weakness or numbness fevers or chills. She denies any significant ankle knee or hip pain. She had a consultation with a podiatrist a few years ago who recommended bunion surgery in the future if pain continued. She is exercising in an attempt to lose weight.  Past medical history, Surgical history, Family history not pertinant except as noted below, Social history, Allergies, and medications have been entered into the medical record, reviewed, and no changes needed.   Review of Systems: No headache, visual changes, nausea, vomiting, diarrhea, constipation, dizziness, abdominal pain, skin rash, fevers, chills, night sweats, weight loss, swollen lymph nodes, body aches, joint swelling, muscle aches, chest pain, shortness of breath, mood changes, visual or auditory hallucinations.   Objective:    Filed Vitals:   02/15/15 0938  BP: 136/82  Pulse: 77   General: Well Developed, well nourished, and in no acute distress.  Neuro/Psych: Alert and oriented x3, extra-ocular muscles intact, able to move all 4 extremities, sensation grossly intact. Skin: Warm and dry, no rashes noted.  Respiratory: Not using accessory muscles, speaking in full sentences, trachea midline.  Cardiovascular: Pulses palpable, no extremity edema. Abdomen: Does not appear  distended. MSK: Left foot has moderate bunion with mild bunionette. Gap between the first and second toes is present. Foot pronation with standing present. No significant ankle subluxation. The right foot also has a mild bunion with no bunionette. Minimal toe spacing. Moderate pronation as well. No ankle subluxation. Feet bilaterally have normal pulses motion and sensation. No leg length discrepancy Hip abduction strength is 4/5 bilaterally Patient walks with a gait significant for ankle pronation.  Gait, foot pronation and pain improved with scaphoid pads bilaterally.  No results found for this or any previous visit (from the past 24 hour(s)). No results found.  Impression and Recommendations:   This case required medical decision making of moderate complexity.

## 2015-02-15 NOTE — Assessment & Plan Note (Signed)
Not terrible at this time. Consider metatarsal pads as needed.

## 2015-02-17 ENCOUNTER — Ambulatory Visit (INDEPENDENT_AMBULATORY_CARE_PROVIDER_SITE_OTHER): Payer: 59

## 2015-02-17 DIAGNOSIS — Z0000 Encounter for general adult medical examination without abnormal findings: Secondary | ICD-10-CM

## 2015-02-17 DIAGNOSIS — Z1231 Encounter for screening mammogram for malignant neoplasm of breast: Secondary | ICD-10-CM | POA: Diagnosis not present

## 2015-02-17 NOTE — Telephone Encounter (Signed)
Received fax from [facility] and they cannot perform prior authorization due to medication is a plan exclusion. - CF

## 2015-03-19 ENCOUNTER — Encounter: Payer: Self-pay | Admitting: Family Medicine

## 2015-03-19 ENCOUNTER — Ambulatory Visit (INDEPENDENT_AMBULATORY_CARE_PROVIDER_SITE_OTHER): Payer: 59 | Admitting: Family Medicine

## 2015-03-19 VITALS — BP 145/88 | HR 74 | Wt 210.0 lb

## 2015-03-19 DIAGNOSIS — M2012 Hallux valgus (acquired), left foot: Secondary | ICD-10-CM

## 2015-03-19 DIAGNOSIS — M7742 Metatarsalgia, left foot: Secondary | ICD-10-CM

## 2015-03-19 MED ORDER — DICLOFENAC SODIUM 2 % TD SOLN: 2.0000 | Freq: Two times a day (BID) | TRANSDERMAL | Status: DC

## 2015-03-19 NOTE — Patient Instructions (Signed)
Thank you for coming in today. Use the pennaid solution for pain as needed.  Use the metatarsal and scaphoid pads.  Return as needed.

## 2015-03-19 NOTE — Progress Notes (Signed)
       Elaine Murillo is a 45 y.o. female who presents to [facility]: Primary Care today for follow-up foot pain. Patient was seen about a month ago was diagnosed with bunions hallux rigidus and metatarsalgia bilaterally. She was treated with scaphoid pads and hip abduction strengthening exercises and had said significant improvement. She is not interested in orthotics today. She does continue to note some metatarsal head pain bilaterally especially in the second and third metatarsal heads. She continues To exercise. Overall she feels well. She has episodes of pain intermittently.   Past Medical History  Diagnosis Date  . Uterine fibroids affecting pregnancy   . Anxiety    Past Surgical History  Procedure Laterality Date  . Tonsillectomy     Social History  Substance Use Topics  . Smoking status: Never Smoker   . Smokeless tobacco: Never Used  . Alcohol Use: No   family history includes Stroke in her paternal grandmother.  ROS as above Medications: Current Outpatient Prescriptions  Medication Sig Dispense Refill  . Levonorgestrel-Ethinyl Estradiol (AMETHIA,CAMRESE) 0.15-0.03 &0.01 MG tablet Take 1 tablet by mouth daily. 1 Package 6  . Diclofenac Sodium 2 % SOLN Place 2 sprays onto the skin 2 (two) times daily. 1 Bottle 11  . Lorcaserin HCl 10 MG TABS Take 1 tablet twice a day. (Patient not taking: Reported on 03/19/2015) 60 tablet 2   No current facility-administered medications for this visit.   Allergies  Allergen Reactions  . Phentermine     Felt overwhelmed and more anxiety.      Exam:  BP 145/88 mmHg  Pulse 74  Wt 210 lb (95.255 kg) Gen: Well NAD MSK: Left foot has moderate bunion with mild bunionette. Gap between the first and second toes is present. Foot pronation with standing present. No significant ankle subluxation. The right foot also has a mild bunion with no bunionette.  Minimal toe spacing. Moderate pronation as well. No ankle subluxation. Mildly tender to palpation bilateral second and third metatarsal heads plantar. Feet bilaterally have normal pulses motion and sensation. Metatarsal pain improved with metatarsal pads.  No results found for this or any previous visit (from the past 24 hour(s)). No results found.   Please see individual assessment and plan sections.

## 2015-03-19 NOTE — Assessment & Plan Note (Signed)
Improvement metatarsal pads. Continue metatarsal pad scaphoid pads and exercises.

## 2015-03-19 NOTE — Assessment & Plan Note (Signed)
Continue metatarsal and scaphoid pads. Recommend Pennsaid solution. Discussed injection options.

## 2015-08-16 ENCOUNTER — Ambulatory Visit (INDEPENDENT_AMBULATORY_CARE_PROVIDER_SITE_OTHER): Payer: 59 | Admitting: Physician Assistant

## 2015-08-16 ENCOUNTER — Encounter: Payer: Self-pay | Admitting: Physician Assistant

## 2015-08-16 VITALS — BP 133/60 | HR 79 | Ht 64.0 in | Wt 206.0 lb

## 2015-08-16 DIAGNOSIS — E669 Obesity, unspecified: Secondary | ICD-10-CM | POA: Diagnosis not present

## 2015-08-16 DIAGNOSIS — E781 Pure hyperglyceridemia: Secondary | ICD-10-CM

## 2015-08-16 LAB — LIPID PANEL
Cholesterol: 170 mg/dL (ref 125–200)
HDL: 51 mg/dL (ref >=46)
LDL CALC: 92 mg/dL (ref <130)
TRIGLYCERIDES: 135 mg/dL (ref <150)
Total CHOL/HDL Ratio: 3.3 Ratio (ref <=5.0)
VLDL: 27 mg/dL (ref <30)

## 2015-08-16 MED ORDER — LORCASERIN HCL 10 MG PO TABS: ORAL_TABLET | ORAL | Status: DC

## 2015-08-16 MED ORDER — PHENTERMINE HCL 37.5 MG PO TABS: 37.5000 mg | ORAL_TABLET | Freq: Every day | ORAL | Status: DC

## 2015-08-16 NOTE — Progress Notes (Signed)
   Subjective:    Patient ID: Elaine Murillo, female    DOB: [birth year], 45 y.o.   MRN: [id number]  HPI Patient is a 45 year old female who presents to the clinic to recheck her triglycerides. We originally check them about 6 months ago and her triglycerides were 252. She has started fish oil daily and watching her diet. She continues to exercise most days for at least 30 minutes. She would like recheck today.  She remains frustrated with her weight. She is down 4 pounds but with almost daily exercise. She continues to struggle with her appetite. She has lots of cravings. She continues to have a ravenous appetite after she is working out. She only took a week for 3 days because she read the side effect profile. She is concerned about being on medication. She was taking the full dose of phentermine when she felt like she was more angry and irritable. She is interested in perhaps trying one half tab and seeing how she does.   Review of Systems  All other systems reviewed and are negative.      Objective:   Physical Exam  Constitutional: She is oriented to person, place, and time. She appears well-developed and well-nourished.  Obese.   HENT:  Head: Normocephalic and atraumatic.  Cardiovascular: Normal rate, regular rhythm and normal heart sounds.   Pulmonary/Chest: Effort normal and breath sounds normal.  Neurological: She is alert and oriented to person, place, and time.  Psychiatric: She has a normal mood and affect. Her behavior is normal.          Assessment & Plan:  Elevated triglycerides- will recheck lipid panel today. Continue on fish oil.   Obesity- after discussion will start phentermine again 1/2 tablet daily. If continue to have side effects on half dose. Printed out belviq to try. Discussed side effects and MOA of all weight loss drug. Gave her topamax to read on. Follow up with me in one month. Continue diet and regular exercise.

## 2015-08-16 NOTE — Patient Instructions (Signed)
Topiramate tablets  What is this medicine?  TOPIRAMATE (toe PYRE a mate) is used to treat seizures in adults or children with epilepsy. It is also used for the prevention of migraine headaches.  This medicine may be used for other purposes; ask your health care provider or pharmacist if you have questions.  What should I tell my health care provider before I take this medicine?  They need to know if you have any of these conditions:  -bleeding disorders  -cirrhosis of the liver or liver disease  -diarrhea  -glaucoma  -kidney stones or kidney disease  -low blood counts, like low white cell, platelet, or red cell counts  -lung disease like asthma, obstructive pulmonary disease, emphysema  -metabolic acidosis  -on a ketogenic diet  -schedule for surgery or a procedure  -suicidal thoughts, plans, or attempt; a previous suicide attempt by you or a family member  -an unusual or allergic reaction to topiramate, other medicines, foods, dyes, or preservatives  -pregnant or trying to get pregnant  -breast-feeding  How should I use this medicine?  Take this medicine by mouth with a glass of water. Follow the directions on the prescription label. Do not crush or chew. You may take this medicine with meals. Take your medicine at regular intervals. Do not take it more often than directed.  Talk to your pediatrician regarding the use of this medicine in children. Special care may be needed. While this drug may be prescribed for children as young as 2 years of age for selected conditions, precautions do apply.  Overdosage: If you think you have taken too much of this medicine contact a poison control center or emergency room at once.  NOTE: This medicine is only for you. Do not share this medicine with others.  What if I miss a dose?  If you miss a dose, take it as soon as you can. If your next dose is to be taken in less than 6 hours, then do not take the missed dose. Take the next dose at your regular time. Do not take double or  extra doses.  What may interact with this medicine?  Do not take this medicine with any of the following medications:  -probenecid  This medicine may also interact with the following medications:  -acetazolamide  -alcohol  -amitriptyline  -aspirin and aspirin-like medicines  -birth control pills  -certain medicines for depression  -certain medicines for seizures  -certain medicines that treat or prevent blood clots like warfarin, enoxaparin, dalteparin, apixaban, dabigatran, and rivaroxaban  -digoxin  -hydrochlorothiazide  -lithium  -medicines for pain, sleep, or muscle relaxation  -metformin  -methazolamide  -NSAIDS, medicines for pain and inflammation, like ibuprofen or naproxen  -pioglitazone  -risperidone  This list may not describe all possible interactions. Give your health care provider a list of all the medicines, herbs, non-prescription drugs, or dietary supplements you use. Also tell them if you smoke, drink alcohol, or use illegal drugs. Some items may interact with your medicine.  What should I watch for while using this medicine?  Visit your doctor or health care professional for regular checks on your progress. Do not stop taking this medicine suddenly. This increases the risk of seizures if you are using this medicine to control epilepsy. Wear a medical identification bracelet or chain to say you have epilepsy or seizures, and carry a card that lists all your medicines.  This medicine can decrease sweating and increase your body temperature. Watch for signs of deceased sweating   or fever, especially in children. Avoid extreme heat, hot baths, and saunas. Be careful about exercising, especially in hot weather. Contact your health care provider right away if you notice a fever or decrease in sweating.  You should drink plenty of fluids while taking this medicine. If you have had kidney stones in the past, this will help to reduce your chances of forming kidney stones.  If you have stomach pain, with  nausea or vomiting and yellowing of your eyes or skin, call your doctor immediately.  You may get drowsy, dizzy, or have blurred vision. Do not drive, use machinery, or do anything that needs mental alertness until you know how this medicine affects you. To reduce dizziness, do not sit or stand up quickly, especially if you are an older patient. Alcohol can increase drowsiness and dizziness. Avoid alcoholic drinks.  If you notice blurred vision, eye pain, or other eye problems, seek medical attention at once for an eye exam.  The use of this medicine may increase the chance of suicidal thoughts or actions. Pay special attention to how you are responding while on this medicine. Any worsening of mood, or thoughts of suicide or dying should be reported to your health care professional right away.  This medicine may increase the chance of developing metabolic acidosis. If left untreated, this can cause kidney stones, bone disease, or slowed growth in children. Symptoms include breathing fast, fatigue, loss of appetite, irregular heartbeat, or loss of consciousness. Call your doctor immediately if you experience any of these side effects. Also, tell your doctor about any surgery you plan on having while taking this medicine since this may increase your risk for metabolic acidosis.  Birth control pills may not work properly while you are taking this medicine. Talk to your doctor about using an extra method of birth control.  Women who become pregnant while using this medicine may enroll in the [place] Antiepileptic Drug Pregnancy Registry by calling 1-888-233-2334. This registry collects information about the safety of antiepileptic drug use during pregnancy.  What side effects may I notice from receiving this medicine?  Side effects that you should report to your doctor or health care professional as soon as possible:  -allergic reactions like skin rash, itching or hives, swelling of the face, lips, or  tongue  -decreased sweating and/or rise in body temperature  -depression  -difficulty breathing, fast or irregular breathing patterns  -difficulty speaking  -difficulty walking or controlling muscle movements  -hearing impairment  -redness, blistering, peeling or loosening of the skin, including inside the mouth  -tingling, pain or numbness in the hands or feet  -unusual bleeding or bruising  -unusually weak or tired  -worsening of mood, thoughts or actions of suicide or dying  Side effects that usually do not require medical attention (report to your doctor or health care professional if they continue or are bothersome):  -altered taste  -back pain, joint or muscle aches and pains  -diarrhea, or constipation  -headache  -loss of appetite  -nausea  -stomach upset, indigestion  -tremors  This list may not describe all possible side effects. Call your doctor for medical advice about side effects. You may report side effects to FDA at 1-800-FDA-1088.  Where should I keep my medicine?  Keep out of the reach of children.  Store at room temperature between 15 and 30 degrees C (59 and 86 degrees F) in a tightly closed container. Protect from moisture. Throw away any unused medicine after the expiration

## 2015-09-08 ENCOUNTER — Ambulatory Visit (INDEPENDENT_AMBULATORY_CARE_PROVIDER_SITE_OTHER): Payer: 59 | Admitting: Physician Assistant

## 2015-09-08 ENCOUNTER — Encounter: Payer: Self-pay | Admitting: Physician Assistant

## 2015-09-08 VITALS — BP 133/70 | HR 79 | Ht 64.0 in | Wt 201.0 lb

## 2015-09-08 DIAGNOSIS — E669 Obesity, unspecified: Secondary | ICD-10-CM

## 2015-09-08 DIAGNOSIS — R635 Abnormal weight gain: Secondary | ICD-10-CM

## 2015-09-08 MED ORDER — PHENTERMINE HCL 37.5 MG PO TABS: 37.5000 mg | ORAL_TABLET | Freq: Every day | ORAL | Status: DC

## 2015-09-08 NOTE — Patient Instructions (Signed)
Wellbutrin/topamax/belviq

## 2015-09-08 NOTE — Progress Notes (Signed)
   Subjective:    Patient ID: Elaine Murillo, female    DOB: 1971-01-11, 45 y.o.   MRN: [id number]  HPI  Pt is a 45 yo female who is obese and comes in to follow up on phentermine for weight loss. She has lost 5lbs in one month. She continues to take 1/2 tablet of phentermine. When she went to a full tablet she was not able to sleep and began more anxious. She was not able to afford belviq at 80 dollars a month. She is exercising daily. Denies any PVC's. Overall she does feel a little more "moody".     Review of Systems  All other systems reviewed and are negative.      Objective:   Physical Exam  Constitutional: She appears well-developed and well-nourished.  Obesity.   Cardiovascular: Normal rate, regular rhythm and normal heart sounds.   Psychiatric: She has a normal mood and affect. Her behavior is normal.          Assessment & Plan:  Obesity/abnormal weight gain- refilled phentermine for 1/2 tablet daily. Discussed if side effects get too much to handle to stop and let us know. Continue exercising. Continue working on a healthy diet. Follow up in 2 months with nurse visit with weight and vital check. Gave options for long term weight loss medications. Will call drug rep on belviq and see if can get cheaper than 80 dollars.

## 2015-11-10 ENCOUNTER — Ambulatory Visit: Payer: 59 | Admitting: Physician Assistant

## 2015-11-16 ENCOUNTER — Encounter: Payer: Self-pay | Admitting: Physician Assistant

## 2015-11-16 ENCOUNTER — Ambulatory Visit (INDEPENDENT_AMBULATORY_CARE_PROVIDER_SITE_OTHER): Payer: 59 | Admitting: Physician Assistant

## 2015-11-16 VITALS — BP 128/71 | HR 73 | Ht 64.0 in | Wt 196.0 lb

## 2015-11-16 DIAGNOSIS — E669 Obesity, unspecified: Secondary | ICD-10-CM | POA: Diagnosis not present

## 2015-11-16 DIAGNOSIS — R635 Abnormal weight gain: Secondary | ICD-10-CM

## 2015-11-16 DIAGNOSIS — Z23 Encounter for immunization: Secondary | ICD-10-CM | POA: Diagnosis not present

## 2015-11-16 DIAGNOSIS — G43809 Other migraine, not intractable, without status migrainosus: Secondary | ICD-10-CM | POA: Insufficient documentation

## 2015-11-16 MED ORDER — FROVATRIPTAN SUCCINATE 2.5 MG PO TABS: 2.5000 mg | ORAL_TABLET | ORAL | 0 refills | Status: DC | PRN

## 2015-11-16 MED ORDER — PHENTERMINE HCL 37.5 MG PO TABS: 37.5000 mg | ORAL_TABLET | Freq: Every day | ORAL | 0 refills | Status: DC

## 2015-11-16 NOTE — Progress Notes (Addendum)
Subjective:     Patient ID: Elaine Murillo, female   DOB: 1970-11-08, 45 y.o.   MRN: [id number]  HPI Patient is a 45 y.o. Caucasian female presenting with complaints of abnormal weight gain. Patient has previously taken phentermine with some success. However, the patient states that she feels that a whole tablet of phentermine may be too much for her as she was having sleeping difficulties and that she is more comfortable with half a tablet. The patient notes that she stopped taking this medication in July and had only been taking half a tablet with a 5 lb weight loss. The patient states that she had one episode in which she took the phentermine and become overwhelmed with a "tight/buzzed feeling" and started crying over her living situation. The patient notes that her sister recently moved in with her two young children and this has cause an increased amount of stress in her life. The patient states that this has impeded on her ability to exercise although she does work out at [employer] 2-3 times weekly. The patient states that she is still having difficulty with her diet and still eats sweats. Additionally, the patient states that she has numerous abnormal symptoms around the time of her menstrual period. The patient notes that she feels a tightness in her chest, back, and neck with a pain that is located behind her right eye. The patient denies aura, headache, nausea, vomiting, or acute pain. Lastly, the patient states that she believes she needs glasses and does have an optometrist.  Review of Systems  Constitutional: Negative for activity change, appetite change, chills, diaphoresis, fatigue, fever and unexpected weight change.  HENT: Negative for congestion, ear discharge, ear pain, postnasal drip, rhinorrhea, sinus pressure, sneezing and sore throat.   Eyes: Negative for discharge and itching.  Respiratory: Positive for chest tightness. Negative for apnea, cough, shortness of breath, wheezing and  stridor.   Cardiovascular: Negative for chest pain, palpitations and leg swelling.  Gastrointestinal: Negative for abdominal distention, abdominal pain, blood in stool, constipation, diarrhea, nausea and vomiting.  Endocrine: Negative for cold intolerance, polydipsia and polyphagia.  Genitourinary: Positive for menstrual problem. Negative for decreased urine volume, difficulty urinating, dysuria, flank pain, frequency, hematuria, pelvic pain, urgency, vaginal bleeding, vaginal discharge and vaginal pain.  Musculoskeletal: Negative for arthralgias, neck pain and neck stiffness.  Neurological: Negative for dizziness, weakness, light-headedness, numbness and headaches.      Objective:   Physical Exam  Constitutional: She is oriented to person, place, and time. She appears well-developed and well-nourished. No distress.  HENT:  Head: Normocephalic and atraumatic.  Right Ear: External ear normal.  Left Ear: External ear normal.  Nose: Nose normal.  Mouth/Throat: Oropharynx is clear and moist. No oropharyngeal exudate.  Eyes: Conjunctivae and EOM are normal. Pupils are equal, round, and reactive to light. Right eye exhibits no discharge. Left eye exhibits no discharge. No scleral icterus.  Neck: Normal range of motion. Neck supple. No JVD present. No tracheal deviation present. No thyromegaly present.  Cardiovascular: Normal rate, regular rhythm, normal heart sounds and intact distal pulses.  Exam reveals no gallop and no friction rub.   No murmur heard. Pulmonary/Chest: Effort normal and breath sounds normal. No stridor. No respiratory distress. She has no wheezes. She has no rales. She exhibits no tenderness.  Lymphadenopathy:    She has no cervical adenopathy.  Neurological: She is alert and oriented to person, place, and time. No cranial nerve deficit. She exhibits normal muscle tone. Coordination normal.  Skin: Skin is warm and dry. No rash noted. She is not diaphoretic. No erythema. No  pallor.  Psychiatric: She has a normal mood and affect. Her behavior is normal. Judgment and thought content normal.  Nursing note and vitals reviewed.     Assessment:     Elaine Murillo was seen today for abnormal weight gain.  Diagnoses and all orders for this visit:  Obesity  Influenza vaccine needed -     Flu Vaccine QUAD 36+ mos PF IM (Fluarix & Fluzone Quad PF)  Abnormal weight gain  Migraine variant  Other orders -     phentermine (ADIPEX-P) 37.5 MG tablet; Take 1 tablet (37.5 mg total) by mouth daily before breakfast. -     frovatriptan (FROVA) 2.5 MG tablet; Take 1 tablet (2.5 mg total) by mouth as needed for migraine. If recurs, may repeat after 2 hours. Max of 3 tabs in 24 hours.      Plan:     1. Obesity/abnormal weight gain: Extensively (15 minutes) discussed with patient the need for daily dietary implementation with a focus on portion control and daily caloric deficits. Patient was re-prescribed phentermine 37.5 mg tablet with instructions to take 1 or 1/2 tablet by mouth daily before breakfast. Patient is aware of the potentials for side-effects and to discontinue this medication should they occur. Patient advised to track their daily caloric intake. Patient voices understanding of ideal weight. Patient to follow-up in 1 month for a weight check.  2.  Migraine variant - Discussed with patient that her pre-menstrual symptoms may be a migraine variant. Patient to take frovatripitan 2.40md tablet by mouth when she feels a migraine coming. Patient was instructed to not exceed 3 tablets in 24 hours. Patient was given a headache log to track the onset and symptoms of her migraines. Patient to return-to-clinic if symptoms do not improve or worsen.  Summary - Patient was given flu vaccine in clinic today.

## 2015-11-16 NOTE — Patient Instructions (Signed)

## 2015-11-26 ENCOUNTER — Other Ambulatory Visit: Payer: Self-pay | Admitting: Obstetrics & Gynecology

## 2015-12-29 ENCOUNTER — Ambulatory Visit (INDEPENDENT_AMBULATORY_CARE_PROVIDER_SITE_OTHER): Payer: 59 | Admitting: Obstetrics & Gynecology

## 2015-12-29 ENCOUNTER — Encounter: Payer: Self-pay | Admitting: Obstetrics & Gynecology

## 2015-12-29 VITALS — BP 136/83 | HR 99 | Resp 16 | Ht 64.0 in | Wt 196.0 lb

## 2015-12-29 DIAGNOSIS — Z124 Encounter for screening for malignant neoplasm of cervix: Secondary | ICD-10-CM | POA: Diagnosis not present

## 2015-12-29 DIAGNOSIS — N852 Hypertrophy of uterus: Secondary | ICD-10-CM

## 2015-12-29 DIAGNOSIS — Z01419 Encounter for gynecological examination (general) (routine) without abnormal findings: Secondary | ICD-10-CM

## 2015-12-29 DIAGNOSIS — Z1151 Encounter for screening for human papillomavirus (HPV): Secondary | ICD-10-CM | POA: Diagnosis not present

## 2015-12-29 MED ORDER — LEVONORGEST-ETH ESTRAD 91-DAY 0.15-0.03 &0.01 MG PO TABS: 1.0000 | ORAL_TABLET | Freq: Every day | ORAL | 6 refills | Status: DC

## 2015-12-29 NOTE — Progress Notes (Signed)
annual

## 2015-12-29 NOTE — Progress Notes (Signed)
Subjective:    Elaine Murillo is a 45 y.o. MW P2 (57 and 61 yo kids) female who presents for an annual exam. The patient has no complaints today. The patient is sexually active. GYN screening history: last pap: was normal. The patient wears seatbelts: yes. The patient participates in regular exercise: yes. Has the patient ever been transfused or tattooed?: no. The patient reports that there is not domestic violence in her life.   Menstrual History: OB History    Gravida Para Term Preterm AB Living   3 2 2     2    SAB TAB Ectopic Multiple Live Births           2      Menarche age: 45 Patient's last menstrual period was 10/09/2015.    The following portions of the patient's history were reviewed and updated as appropriate: allergies, current medications, past family history, past medical history, past social history, past surgical history and problem list.  Review of Systems Pertinent items are noted in HPI.   Married for 10 years, monogamous for 16 years Homemaker Flu vaccine UTD Mammogram due 1/18 FH- no breast/gyn/colon cancer   Objective:    BP 136/83   Pulse 99   Resp 16   Ht 5\' 4"  (1.626 m)   Wt 196 lb (88.9 kg)   LMP 10/09/2015   BMI 33.64 kg/m   General Appearance:    Alert, cooperative, no distress, appears stated age  Head:    Normocephalic, without obvious abnormality, atraumatic  Eyes:    PERRL, conjunctiva/corneas clear, EOM's intact, fundi    benign, both eyes  Ears:    Normal TM's and external ear canals, both ears  Nose:   Nares normal, septum midline, mucosa normal, no drainage    or sinus tenderness  Throat:   Lips, mucosa, and tongue normal; teeth and gums normal  Neck:   Supple, symmetrical, trachea midline, no adenopathy;    thyroid:  no enlargement/tenderness/nodules; no carotid   bruit or JVD  Back:     Symmetric, no curvature, ROM normal, no CVA tenderness  Lungs:     Clear to auscultation bilaterally, respirations unlabored  Chest Wall:    No  tenderness or deformity   Heart:    Regular rate and rhythm, S1 and S2 normal, no murmur, rub   or gallop  Breast Exam:    No tenderness, masses, or nipple abnormality  Abdomen:     Soft, non-tender, bowel sounds active all four quadrants,    no masses, no organomegaly  Genitalia:    Normal female without lesion, discharge or tenderness, 6-8 week size uterus, no palpable adnexal masses     Extremities:   Extremities normal, atraumatic, no cyanosis or edema  Pulses:   2+ and symmetric all extremities  Skin:   Skin color, texture, turgor normal, no rashes or lesions  Lymph nodes:   Cervical, supraclavicular, and axillary nodes normal  Neurologic:   CNII-XII intact, normal strength, sensation and reflexes    throughout   .    Assessment:    Healthy female exam.   Uterine enlargement   Plan:     Thin prep Pap smear.  with cotesting She may have refills of her OCPs for the next 3 years (until pap is due again) Check gyn u/s

## 2015-12-30 ENCOUNTER — Ambulatory Visit (INDEPENDENT_AMBULATORY_CARE_PROVIDER_SITE_OTHER): Payer: 59

## 2015-12-30 DIAGNOSIS — N852 Hypertrophy of uterus: Secondary | ICD-10-CM

## 2015-12-30 LAB — CYTOLOGY - PAP
Diagnosis: NEGATIVE
HPV: NOT DETECTED

## 2016-02-15 ENCOUNTER — Encounter: Payer: Self-pay | Admitting: Physician Assistant

## 2016-02-15 ENCOUNTER — Ambulatory Visit (INDEPENDENT_AMBULATORY_CARE_PROVIDER_SITE_OTHER): Payer: 59 | Admitting: Physician Assistant

## 2016-02-15 VITALS — BP 134/84 | HR 76 | Ht 64.0 in | Wt 193.0 lb

## 2016-02-15 DIAGNOSIS — E6609 Other obesity due to excess calories: Secondary | ICD-10-CM

## 2016-02-15 DIAGNOSIS — Z6833 Body mass index (BMI) 33.0-33.9, adult: Secondary | ICD-10-CM

## 2016-02-15 NOTE — Patient Instructions (Addendum)
wellbutrin belviq saxenda topamax  Consider obesity medicine counseling.

## 2016-02-15 NOTE — Progress Notes (Signed)
   Subjective:    Patient ID: Elaine Murillo, female    DOB: 12/09/1970, 45 y.o.   MRN: [id number]  HPI Pt is a 45 yo obese female who presents to the clinic to follow up on weight. She is taking 1/2 phentermine tablet for last 2 months and lost 3lbs. She takes 1/2 tablet due to anxiety and irritability with full tablet.  She is exercising 4 times a week. She is not having the results she desires.    Review of Systems  All other systems reviewed and are negative.      Objective:   Physical Exam  Constitutional: She is oriented to person, place, and time. She appears well-developed and well-nourished.  HENT:  Head: Normocephalic and atraumatic.  Cardiovascular: Normal rate, regular rhythm and normal heart sounds.   Pulmonary/Chest: Effort normal and breath sounds normal.  Neurological: She is alert and oriented to person, place, and time.  Psychiatric: She has a normal mood and affect. Her behavior is normal.          Assessment & Plan:  Marland KitchenMarland KitchenRue was seen today for abnormal weight gain.  Diagnoses and all orders for this visit:  Class 1 obesity due to excess calories without serious comorbidity with body mass index (BMI) of 33.0 to 33.9 in adult  stopped phentermine. No longer effective.  Discussed other options with patients. Pt not ready to try any other medications.  Belviq/saxenda/topamax.  Consider obesity medicine referral.   Spent 30 minutes with patient and greater than 50 percent of visit spent counseling patient regarding weight/healthy lifestyle.

## 2016-02-16 ENCOUNTER — Encounter: Payer: Self-pay | Admitting: Physician Assistant

## 2016-02-18 ENCOUNTER — Ambulatory Visit (INDEPENDENT_AMBULATORY_CARE_PROVIDER_SITE_OTHER): Payer: 59

## 2016-02-18 DIAGNOSIS — Z1231 Encounter for screening mammogram for malignant neoplasm of breast: Secondary | ICD-10-CM

## 2016-02-18 DIAGNOSIS — Z01419 Encounter for gynecological examination (general) (routine) without abnormal findings: Secondary | ICD-10-CM

## 2016-04-19 ENCOUNTER — Ambulatory Visit: Payer: 59 | Admitting: Obstetrics & Gynecology

## 2016-06-01 ENCOUNTER — Encounter: Payer: Self-pay | Admitting: Physician Assistant

## 2016-06-01 ENCOUNTER — Ambulatory Visit (INDEPENDENT_AMBULATORY_CARE_PROVIDER_SITE_OTHER): Payer: 59 | Admitting: Physician Assistant

## 2016-06-01 VITALS — BP 152/85 | HR 73 | Wt 201.0 lb

## 2016-06-01 DIAGNOSIS — R0982 Postnasal drip: Secondary | ICD-10-CM | POA: Diagnosis not present

## 2016-06-01 DIAGNOSIS — J309 Allergic rhinitis, unspecified: Secondary | ICD-10-CM

## 2016-06-01 DIAGNOSIS — R030 Elevated blood-pressure reading, without diagnosis of hypertension: Secondary | ICD-10-CM | POA: Diagnosis not present

## 2016-06-01 MED ORDER — FLUTICASONE PROPIONATE 50 MCG/ACT NA SUSP: 2.0000 | Freq: Every day | NASAL | 6 refills | Status: DC

## 2016-06-01 MED ORDER — CETIRIZINE HCL 10 MG PO TABS: 10.0000 mg | ORAL_TABLET | Freq: Every day | ORAL | 3 refills | Status: DC

## 2016-06-01 NOTE — Patient Instructions (Addendum)
- Take generic Zyrtec nightly - Start Flonase, 1-2 sprays in each nostril daily  To use your nasal spray: - block one nostril - aim away from your nasal septum - dispense and gently inhale (DO NOT SNIFF) - if the medicine drips back out, tilt your head back, dispense medicine again and keep your head tilted back for 5 seconds  Allergic Rhinitis Allergic rhinitis is when the mucous membranes in the nose respond to allergens. Allergens are particles in the air that cause your body to have an allergic reaction. This causes you to release allergic antibodies. Through a chain of events, these eventually cause you to release histamine into the blood stream. Although meant to protect the body, it is this release of histamine that causes your discomfort, such as frequent sneezing, congestion, and an itchy, runny nose. What are the causes? Seasonal allergic rhinitis (hay fever) is caused by pollen allergens that may come from grasses, trees, and weeds. Year-round allergic rhinitis (perennial allergic rhinitis) is caused by allergens such as house dust mites, pet dander, and mold spores. What are the signs or symptoms?  Nasal stuffiness (congestion).  Itchy, runny nose with sneezing and tearing of the eyes. How is this diagnosed? Your health care provider can help you determine the allergen or allergens that trigger your symptoms. If you and your health care provider are unable to determine the allergen, skin or blood testing may be used. Your health care provider will diagnose your condition after taking your health history and performing a physical exam. Your health care provider may assess you for other related conditions, such as asthma, pink eye, or an ear infection. How is this treated? Allergic rhinitis does not have a cure, but it can be controlled by:  Medicines that block allergy symptoms. These may include allergy shots, nasal sprays, and oral antihistamines.  Avoiding the allergen. Hay  fever may often be treated with antihistamines in pill or nasal spray forms. Antihistamines block the effects of histamine. There are over-the-counter medicines that may help with nasal congestion and swelling around the eyes. Check with your health care provider before taking or giving this medicine. If avoiding the allergen or the medicine prescribed do not work, there are many new medicines your health care provider can prescribe. Stronger medicine may be used if initial measures are ineffective. Desensitizing injections can be used if medicine and avoidance does not work. Desensitization is when a patient is given ongoing shots until the body becomes less sensitive to the allergen. Make sure you follow up with your health care provider if problems continue. Follow these instructions at home: It is not possible to completely avoid allergens, but you can reduce your symptoms by taking steps to limit your exposure to them. It helps to know exactly what you are allergic to so that you can avoid your specific triggers. Contact a health care provider if:  You have a fever.  You develop a cough that does not stop easily (persistent).  You have shortness of breath.  You start wheezing.  Symptoms interfere with normal daily activities. This information is not intended to replace advice given to you by your health care provider. Make sure you discuss any questions you have with your health care provider. Document Released: 11/15/2000 Document Revised: 10/22/2015 Document Reviewed: 10/28/2012 Elsevier Interactive Patient Education  2017 [employer].    How to Take Your Blood Pressure Blood pressure is a measurement of how strongly your blood is pressing against the walls of your  arteries. Arteries are blood vessels that carry blood from your heart throughout your body. Your health care provider takes your blood pressure at each office visit. You can also take your own blood pressure at home with a  blood pressure machine. You may need to take your own blood pressure:  To confirm a diagnosis of high blood pressure (hypertension).  To monitor your blood pressure over time.  To make sure your blood pressure medicine is working. Supplies needed: To take your blood pressure, you will need a blood pressure machine. You can buy a blood pressure machine, or blood pressure monitor, at most drugstores or online. There are several types of home blood pressure monitors. When choosing one, consider the following:  Choose a monitor that has an arm cuff.  Choose a monitor that wraps snugly around your upper arm. You should be able to fit only one finger between your arm and the cuff.  Do not choose a monitor that measures your blood pressure from your wrist or finger. Your health care provider can suggest a reliable monitor that will meet your needs. How to prepare To get the most accurate reading, avoid the following for 30 minutes before you check your blood pressure:  Drinking caffeine.  Drinking alcohol.  Eating.  Smoking.  Exercising. Five minutes before you check your blood pressure:  Empty your bladder.  Sit quietly without talking in a dining chair, rather than in a soft couch or armchair. How to take your blood pressure To check your blood pressure, follow the instructions in the manual that came with your blood pressure monitor. If you have a digital blood pressure monitor, the instructions may be as follows: 1. Sit up straight. 2. Place your feet on the floor. Do not cross your ankles or legs. 3. Rest your left arm at the level of your heart on a table or desk or on the arm of a chair. 4. Pull up your shirt sleeve. 5. Wrap the blood pressure cuff around the upper part of your left arm, 1 inch (2.5 cm) above your elbow. It is best to wrap the cuff around bare skin. 6. Fit the cuff snugly around your arm. You should be able to place only one finger between the cuff and your  arm. 7. Position the cord inside the groove of your elbow. 8. Press the power button. 9. Sit quietly while the cuff inflates and deflates. 10. Read the digital reading on the monitor screen and write it down (record it). 11. Wait 2-3 minutes, then repeat the steps, starting at step 1. What does my blood pressure reading mean? A blood pressure reading consists of a higher number over a lower number. Ideally, your blood pressure should be below 120/80. The first ("top") number is called the systolic pressure. It is a measure of the pressure in your arteries as your heart beats. The second ("bottom") number is called the diastolic pressure. It is a measure of the pressure in your arteries as the heart relaxes. Blood pressure is classified into four stages. The following are the stages for adults who do not have a short-term serious illness or a chronic condition. Systolic pressure and diastolic pressure are measured in a unit called mm Hg. Normal   Systolic pressure: below 778.  Diastolic pressure: below 80. Elevated   Systolic pressure: 242-353.  Diastolic pressure: below 80. Hypertension stage 1   Systolic pressure: 614-431.  Diastolic pressure: 54-00. Hypertension stage 2   Systolic pressure: 867 or above.  Diastolic pressure: 90 or above. You can have prehypertension or hypertension even if only the systolic or only the diastolic number in your reading is higher than normal. Follow these instructions at home:  Check your blood pressure as often as recommended by your health care provider.  Take your monitor to the next appointment with your health care provider to make sure:  That you are using it correctly.  That it provides accurate readings.  Be sure you understand what your goal blood pressure numbers are.  Tell your health care provider if you are having any side effects from blood pressure medicine. Contact a health care provider if:  Your blood pressure is  consistently high. Get help right away if:  Your systolic blood pressure is higher than 180.  Your diastolic blood pressure is higher than 110. This information is not intended to replace advice given to you by your health care provider. Make sure you discuss any questions you have with your health care provider. Document Released: 07/30/2015 Document Revised: 10/12/2015 Document Reviewed: 07/30/2015 Elsevier Interactive Patient Education  2017 [employer].

## 2016-06-01 NOTE — Progress Notes (Signed)
HPI:                                                                Elaine Murillo is a 46 y.o. female who presents to [facility] today for sore throat  Patient reports a sinus infection that began approximately 2 weeks ago with symptoms of nasal congestion, facial pressure, and yellow rhinorrhea. She states symptoms resolved after 6 days. Since that time she has had a "scratchy throat." Denies fever, chills, dysphagia, cough, dyspnea, or wheezing. Nonsmoker. No history of asthma or pulmonary disease.   Past Medical History:  Diagnosis Date  . Anxiety   . Uterine fibroids affecting pregnancy    Past Surgical History:  Procedure Laterality Date  . TONSILLECTOMY     Social History  Substance Use Topics  . Smoking status: Never Smoker  . Smokeless tobacco: Never Used  . Alcohol use No   family history includes Stroke in her paternal grandmother.  ROS: negative except as noted in the HPI  Medications: Current Outpatient Prescriptions  Medication Sig Dispense Refill  . cetirizine (ZYRTEC) 10 MG tablet Take 1 tablet (10 mg total) by mouth daily. 90 tablet 3  . fluticasone (FLONASE) 50 MCG/ACT nasal spray Place 2 sprays into both nostrils daily. 16 g 6  . Levonorgestrel-Ethinyl Estradiol (AMETHIA,CAMRESE) 0.15-0.03 &0.01 MG tablet Take 1 tablet by mouth daily. 1 Package 6  . Omega-3 Fatty Acids (FISH OIL) 1000 MG CAPS Take 4,000 mg by mouth daily.     No current facility-administered medications for this visit.    No Known Allergies     Objective:  BP (!) 152/85   Pulse 73   Wt 201 lb (91.2 kg)   Breastfeeding? Unknown   BMI 34.50 kg/m  Gen: well-groomed, cooperative, not ill-appearing, no distress HEENT: normal conjunctiva, TM's clear, nasal mucosa pink, oropharynx with mild erythema with thick, white post-nasal secretions, moist mucus membranes, no frontal or maxillary sinus tenderness Pulm: Normal work of breathing,  normal phonation, clear to auscultation bilaterally, no wheezes, rales or rhonchi CV: Normal rate, regular rhythm, s1 and s2 distinct, no murmurs, clicks or rubs  Neuro: alert and oriented x 3, EOM's intact Lymph: no cervical or tonsillar adenopathy Skin: warm and dry, no rashes or lesions on exposed skin, no cyanosis   No results found for this or any previous visit (from the past 72 hour(s)). No results found.    Assessment and Plan: 46 y.o. female with   1. Allergic rhinitis with postnasal drip - cetirizine (ZYRTEC) 10 MG tablet; Take 1 tablet (10 mg total) by mouth daily.  Dispense: 90 tablet; Refill: 3 - fluticasone (FLONASE) 50 MCG/ACT nasal spray; Place 2 sprays into both nostrils daily.  Dispense: 16 g; Refill: 6  2. Elevated blood pressure reading For your blood pressure: - Check blood pressure at home for the next 2 weeks - Check around the same time each day in a relaxed setting (rest for at least 3 minutes, legs uncrossed, arm above level or your heart) - Limit salt. Follow DASH eating plan - Follow-up in 2-3 weeks with PCP  Patient education and anticipatory guidance given Patient agrees with treatment plan Follow-up BP check with PCP in 2 weeks or sooner as needed  Darlyne Russian PA-C

## 2016-06-06 ENCOUNTER — Encounter: Payer: Self-pay | Admitting: Physician Assistant

## 2016-06-06 DIAGNOSIS — R030 Elevated blood-pressure reading, without diagnosis of hypertension: Secondary | ICD-10-CM | POA: Insufficient documentation

## 2016-12-27 ENCOUNTER — Encounter: Payer: Self-pay | Admitting: Physician Assistant

## 2016-12-27 ENCOUNTER — Ambulatory Visit (INDEPENDENT_AMBULATORY_CARE_PROVIDER_SITE_OTHER): Payer: 59 | Admitting: Physician Assistant

## 2016-12-27 VITALS — BP 136/80 | HR 79 | Ht 64.0 in | Wt 207.0 lb

## 2016-12-27 DIAGNOSIS — L853 Xerosis cutis: Secondary | ICD-10-CM

## 2016-12-27 DIAGNOSIS — Z131 Encounter for screening for diabetes mellitus: Secondary | ICD-10-CM

## 2016-12-27 DIAGNOSIS — Z13220 Encounter for screening for lipoid disorders: Secondary | ICD-10-CM | POA: Diagnosis not present

## 2016-12-27 DIAGNOSIS — Z6835 Body mass index (BMI) 35.0-35.9, adult: Secondary | ICD-10-CM

## 2016-12-27 DIAGNOSIS — L299 Pruritus, unspecified: Secondary | ICD-10-CM

## 2016-12-27 DIAGNOSIS — Z23 Encounter for immunization: Secondary | ICD-10-CM | POA: Diagnosis not present

## 2016-12-27 DIAGNOSIS — E669 Obesity, unspecified: Secondary | ICD-10-CM | POA: Diagnosis not present

## 2016-12-27 DIAGNOSIS — Z0000 Encounter for general adult medical examination without abnormal findings: Secondary | ICD-10-CM

## 2016-12-27 LAB — CBC WITH DIFFERENTIAL/PLATELET
BASOS ABS: 63 {cells}/uL (ref 0–200)
Basophils Relative: 0.7 %
EOS ABS: 171 {cells}/uL (ref 15–500)
Eosinophils Relative: 1.9 %
HEMATOCRIT: 42.5 % (ref 35.0–45.0)
Hemoglobin: 14.7 g/dL (ref 11.7–15.5)
LYMPHS ABS: 2997 {cells}/uL (ref 850–3900)
MCH: 30.2 pg (ref 27.0–33.0)
MCHC: 34.6 g/dL (ref 32.0–36.0)
MCV: 87.3 fL (ref 80.0–100.0)
MPV: 10 fL (ref 7.5–12.5)
Monocytes Relative: 5.2 %
NEUTROS PCT: 58.9 %
Neutro Abs: 5301 cells/uL (ref 1500–7800)
Platelets: 336 10*3/uL (ref 140–400)
RBC: 4.87 10*6/uL (ref 3.80–5.10)
RDW: 12.2 % (ref 11.0–15.0)
Total Lymphocyte: 33.3 %
WBC: 9 10*3/uL (ref 3.8–10.8)
WBCMIX: 468 {cells}/uL (ref 200–950)

## 2016-12-27 LAB — COMPLETE METABOLIC PANEL WITH GFR
AG RATIO: 1.5 (calc) (ref 1.0–2.5)
ALBUMIN MSPROF: 4.1 g/dL (ref 3.6–5.1)
ALT: 12 U/L (ref 6–29)
AST: 15 U/L (ref 10–35)
Alkaline phosphatase (APISO): 40 U/L (ref 33–115)
BUN: 11 mg/dL (ref 7–25)
CALCIUM: 9 mg/dL (ref 8.6–10.2)
CO2: 27 mmol/L (ref 20–32)
CREATININE: 1.06 mg/dL (ref 0.50–1.10)
Chloride: 104 mmol/L (ref 98–110)
GFR, EST NON AFRICAN AMERICAN: 63 mL/min/{1.73_m2} (ref >=60)
GFR, Est African American: 73 mL/min/{1.73_m2} (ref >=60)
GLOBULIN: 2.7 g/dL (ref 1.9–3.7)
Glucose, Bld: 88 mg/dL (ref 65–99)
Potassium: 4.7 mmol/L (ref 3.5–5.3)
SODIUM: 138 mmol/L (ref 135–146)
Total Bilirubin: 0.6 mg/dL (ref 0.2–1.2)
Total Protein: 6.8 g/dL (ref 6.1–8.1)

## 2016-12-27 LAB — LIPID PANEL W/REFLEX DIRECT LDL
CHOLESTEROL: 185 mg/dL (ref <200)
HDL: 57 mg/dL (ref >50)
LDL CHOLESTEROL (CALC): 102 mg/dL — AB
Non-HDL Cholesterol (Calc): 128 mg/dL (calc) (ref <130)
Total CHOL/HDL Ratio: 3.2 (calc) (ref <5.0)
Triglycerides: 165 mg/dL — ABNORMAL HIGH (ref <150)

## 2016-12-27 LAB — TSH: TSH: 1.34 mIU/L

## 2016-12-27 NOTE — Patient Instructions (Signed)
saxenda-most effective.  belviq contrave  Keeping You Healthy  Get These Tests 1. Blood Pressure- Have your blood pressure checked once a year by your health care provider.  Normal blood pressure is 120/80. 2. Weight- Have your body mass index (BMI) calculated to screen for obesity.  BMI is measure of body fat based on height and weight.  You can also calculate your own BMI at GravelBags.it. 3. Cholesterol- Have your cholesterol checked every 5 years starting at age 71 then yearly starting at age 55. 86. Chlamydia, HIV, and other sexually transmitted diseases- Get screened every year until age 66, then within three months of each new sexual provider. 5. Pap Test - Every 1-5 years; discuss with your health care provider. 6. Mammogram- Every 1-2 years starting at age 36--50  Take these medicines  Calcium with Vitamin D-Your body needs 1200 mg of Calcium each day and [phone] IU of Vitamin D daily.  Your body can only absorb 500 mg of Calcium at a time so Calcium must be taken in 2 or 3 divided doses throughout the day.  Multivitamin with folic acid- Once daily if it is possible for you to become pregnant.  Get these Immunizations  Gardasil-Series of three doses; prevents HPV related illness such as genital warts and cervical cancer.  Menactra-Single dose; prevents meningitis.  Tetanus shot- Every 10 years.  Flu shot-Every year.  Take these steps 1. Do not smoke-Your healthcare provider can help you quit.  For tips on how to quit go to www.smokefree.gov or call 1-800 QUITNOW. 2. Be physically active- Exercise 5 days a week for at least 30 minutes.  If you are not already physically active, start slow and gradually work up to 30 minutes of moderate physical activity.  Examples of moderate activity include walking briskly, dancing, swimming, bicycling, etc. 3. Breast Cancer- A self breast exam every month is important for early detection of breast cancer.  For more information  and instruction on self breast exams, ask your healthcare provider or https://www.patel.info/. 4. Eat a healthy diet- Eat a variety of healthy foods such as fruits, vegetables, whole grains, low fat milk, low fat cheeses, yogurt, lean meats, poultry and fish, beans, nuts, tofu, etc.  For more information go to www. Thenutritionsource.org 5. Drink alcohol in moderation- Limit alcohol intake to one drink or less per day. Never drink and drive. 6. Depression- Your emotional health is as important as your physical health.  If you're feeling down or losing interest in things you normally enjoy please talk to your healthcare provider about being screened for depression. 7. Dental visit- Brush and floss your teeth twice daily; visit your dentist twice a year. 8. Eye doctor- Get an eye exam at least every 2 years. 9. Helmet use- Always wear a helmet when riding a bicycle, motorcycle, rollerblading or skateboarding. 63. Safe sex- If you may be exposed to sexually transmitted infections, use a condom. 11. Seat belts- Seat belts can save your live; always wear one. 12. Smoke/Carbon Monoxide detectors- These detectors need to be installed on the appropriate level of your home. Replace batteries at least once a year. 13. Skin cancer- When out in the sun please cover up and use sunscreen 15 SPF or higher. 14. Violence- If anyone is threatening or hurting you, please tell your healthcare provider.

## 2016-12-27 NOTE — Progress Notes (Signed)
Subjective:     Elaine Murillo is a 46 y.o. female and is here for a comprehensive physical exam. The patient reports problems - pt is having some itchiness and dry skin over her upper back. due to location makes it hard to scratch. not noticed any lesions. .  Social History   Social History  . Marital status: Married    Spouse name: N/A  . Number of children: N/A  . Years of education: N/A   Occupational History  . homemaker    Social History Main Topics  . Smoking status: Never Smoker  . Smokeless tobacco: Never Used  . Alcohol use No  . Drug use: No  . Sexual activity: Yes    Partners: Male    Birth control/ protection: Pill   Other Topics Concern  . Not on file   Social History Narrative  . No narrative on file   Health Maintenance  Topic Date Due  . INFLUENZA VACCINE  10/04/2016  . MAMMOGRAM  02/17/2017  . PAP SMEAR  12/29/2018  . TETANUS/TDAP  04/10/2022  . HIV Screening  Completed    The following portions of the patient's history were reviewed and updated as appropriate: allergies, current medications, past family history, past medical history, past social history, past surgical history and problem list.  Review of Systems Pertinent items are noted in HPI.   Objective:    BP 136/80   Pulse 79   Ht 5\' 4"  (1.626 m)   Wt 207 lb (93.9 kg)   BMI 35.53 kg/m  General appearance: alert, cooperative and appears stated age Head: Normocephalic, without obvious abnormality, atraumatic Eyes: conjunctivae/corneas clear. PERRL, EOM's intact. Fundi benign. Ears: normal TM's and external ear canals both ears Nose: Nares normal. Septum midline. Mucosa normal. No drainage or sinus tenderness. Throat: lips, mucosa, and tongue normal; teeth and gums normal Neck: no adenopathy, no carotid bruit, no JVD, supple, symmetrical, trachea midline and thyroid not enlarged, symmetric, no tenderness/mass/nodules Back: symmetric, no curvature. ROM normal. No CVA tenderness. Lungs:  clear to auscultation bilaterally and normal percussion bilaterally Heart: regular rate and rhythm, S1, S2 normal, no murmur, click, rub or gallop Abdomen: soft, non-tender; bowel sounds normal; no masses,  no organomegaly Extremities: extremities normal, atraumatic, no cyanosis or edema Pulses: 2+ and symmetric Skin: Skin color, texture, turgor normal. No rashes or lesions Lymph nodes: Cervical, supraclavicular, and axillary nodes normal. Neurologic: Alert and oriented X 3, normal strength and tone. Normal symmetric reflexes. Normal coordination and gait    Assessment:    Healthy female exam.      Plan:    Marland KitchenMarland KitchenAlayssa was seen today for annual exam.  Diagnoses and all orders for this visit:  Routine physical examination -     Lipid Panel w/reflex Direct LDL -     COMPLETE METABOLIC PANEL WITH GFR -     TSH  Influenza vaccine needed -     Flu Vaccine QUAD 6+ mos PF IM (Fluarix Quad PF)  Screening for diabetes mellitus -     COMPLETE METABOLIC PANEL WITH GFR  Screening for lipid disorders -     Lipid Panel w/reflex Direct LDL  Dry skin -     CBC with Differential/Platelet  Itching -     CBC with Differential/Platelet  Class 2 obesity without serious comorbidity with body mass index (BMI) of 35.0 to 35.9 in adult, unspecified obesity type   .Marland Kitchen Depression screen PHQ 2/9 12/27/2016  Decreased Interest 0  Down, Depressed, Hopeless  0  PHQ - 2 Score 0   Fasting labs ordered.  .. Discussed 150 minutes of exercise a week.  Encouraged vitamin D 1000 units and Calcium 1300mg  or 4 servings of dairy a day.  Marland Kitchen.Discussed low carb diet with 1500 calories and 80g of protein.  Exercising at least 150 minutes a week.  [employer] could be a Microbiologist.  Discussed medications but patient is resisent to medications. Phentermine tried in the past and help her lose but she has gained most of it back.   Discussed contact dermatitis for itching however suspect it could be dry skin.  Discussed a good moisturizer. Reassured her I saw nothing on upper back. CBC ordereed to look at WBC and cmp to look at liver enymes.  See After Visit Summary for Counseling Recommendations

## 2016-12-29 DIAGNOSIS — L299 Pruritus, unspecified: Secondary | ICD-10-CM | POA: Insufficient documentation

## 2016-12-29 DIAGNOSIS — L853 Xerosis cutis: Secondary | ICD-10-CM | POA: Insufficient documentation

## 2017-02-02 ENCOUNTER — Other Ambulatory Visit (HOSPITAL_COMMUNITY): Payer: Self-pay | Admitting: Obstetrics & Gynecology

## 2017-02-02 DIAGNOSIS — Z1239 Encounter for other screening for malignant neoplasm of breast: Secondary | ICD-10-CM

## 2017-02-12 ENCOUNTER — Other Ambulatory Visit: Payer: Self-pay | Admitting: Obstetrics & Gynecology

## 2017-02-15 ENCOUNTER — Other Ambulatory Visit: Payer: Self-pay | Admitting: *Deleted

## 2017-02-15 MED ORDER — LEVONORGEST-ETH ESTRAD 91-DAY 0.15-0.03 &0.01 MG PO TABS: 1.0000 | ORAL_TABLET | Freq: Every day | ORAL | 3 refills | Status: DC

## 2017-02-21 ENCOUNTER — Ambulatory Visit: Payer: 59

## 2017-02-22 ENCOUNTER — Ambulatory Visit (INDEPENDENT_AMBULATORY_CARE_PROVIDER_SITE_OTHER): Payer: 59

## 2017-02-22 DIAGNOSIS — Z1231 Encounter for screening mammogram for malignant neoplasm of breast: Secondary | ICD-10-CM | POA: Diagnosis not present

## 2017-02-22 DIAGNOSIS — Z1239 Encounter for other screening for malignant neoplasm of breast: Secondary | ICD-10-CM

## 2017-12-17 ENCOUNTER — Ambulatory Visit (INDEPENDENT_AMBULATORY_CARE_PROVIDER_SITE_OTHER): Payer: 59 | Admitting: Family Medicine

## 2017-12-17 DIAGNOSIS — Z23 Encounter for immunization: Secondary | ICD-10-CM

## 2017-12-31 ENCOUNTER — Other Ambulatory Visit: Payer: Self-pay | Admitting: *Deleted

## 2017-12-31 MED ORDER — LEVONORGEST-ETH ESTRAD 91-DAY 0.15-0.03 &0.01 MG PO TABS: 1.0000 | ORAL_TABLET | Freq: Every day | ORAL | 3 refills | Status: DC

## 2018-01-15 ENCOUNTER — Ambulatory Visit (INDEPENDENT_AMBULATORY_CARE_PROVIDER_SITE_OTHER): Payer: 59 | Admitting: Physician Assistant

## 2018-01-15 ENCOUNTER — Encounter: Payer: Self-pay | Admitting: Physician Assistant

## 2018-01-15 VITALS — BP 132/82 | HR 85 | Ht 64.0 in | Wt 213.0 lb

## 2018-01-15 DIAGNOSIS — Z131 Encounter for screening for diabetes mellitus: Secondary | ICD-10-CM | POA: Diagnosis not present

## 2018-01-15 DIAGNOSIS — M7712 Lateral epicondylitis, left elbow: Secondary | ICD-10-CM

## 2018-01-15 DIAGNOSIS — Z13220 Encounter for screening for lipoid disorders: Secondary | ICD-10-CM

## 2018-01-15 DIAGNOSIS — E6609 Other obesity due to excess calories: Secondary | ICD-10-CM | POA: Diagnosis not present

## 2018-01-15 DIAGNOSIS — Z6836 Body mass index (BMI) 36.0-36.9, adult: Secondary | ICD-10-CM

## 2018-01-15 DIAGNOSIS — Z0000 Encounter for general adult medical examination without abnormal findings: Secondary | ICD-10-CM

## 2018-01-15 DIAGNOSIS — N62 Hypertrophy of breast: Secondary | ICD-10-CM

## 2018-01-15 MED ORDER — DICLOFENAC SODIUM 1 % TD GEL: 4.0000 g | Freq: Four times a day (QID) | TRANSDERMAL | 1 refills | Status: DC

## 2018-01-15 NOTE — Patient Instructions (Signed)

## 2018-01-15 NOTE — Progress Notes (Signed)
Subjective:     Elaine Murillo is a 47 y.o. female and is here for a comprehensive physical exam. The patient reports problems - still having an ongoing struggle with weight no matter how much she works out.    She also has some discomfort and achiness in the her left elbow. Not done anything to make better or worse. Seems to hurt worse after typing all day.   Social History   Socioeconomic History  . Marital status: Married    Spouse name: Not on file  . Number of children: Not on file  . Years of education: Not on file  . Highest education level: Not on file  Occupational History  . Occupation: homemaker  Social Needs  . Financial resource strain: Not on file  . Food insecurity:    Worry: Not on file    Inability: Not on file  . Transportation needs:    Medical: Not on file    Non-medical: Not on file  Tobacco Use  . Smoking status: Never Smoker  . Smokeless tobacco: Never Used  Substance and Sexual Activity  . Alcohol use: No  . Drug use: No  . Sexual activity: Yes    Partners: Male    Birth control/protection: Pill  Lifestyle  . Physical activity:    Days per week: Not on file    Minutes per session: Not on file  . Stress: Not on file  Relationships  . Social connections:    Talks on phone: Not on file    Gets together: Not on file    Attends religious service: Not on file    Active member of club or organization: Not on file    Attends meetings of clubs or organizations: Not on file    Relationship status: Not on file  . Intimate partner violence:    Fear of current or ex partner: Not on file    Emotionally abused: Not on file    Physically abused: Not on file    Forced sexual activity: Not on file  Other Topics Concern  . Not on file  Social History Narrative  . Not on file   Health Maintenance  Topic Date Due  . MAMMOGRAM  02/22/2018  . PAP SMEAR  12/29/2018  . TETANUS/TDAP  04/10/2022  . INFLUENZA VACCINE  Completed  . HIV Screening   Completed    The following portions of the patient's history were reviewed and updated as appropriate: allergies, current medications, past family history, past medical history, past social history, past surgical history and problem list.  Review of Systems Pertinent items noted in HPI and remainder of comprehensive ROS otherwise negative.   Objective:    BP 132/82   Pulse 85   Ht 5\' 4"  (1.626 m)   Wt 213 lb (96.6 kg)   BMI 36.56 kg/m  General appearance: alert, cooperative and appears stated age Head: Normocephalic, without obvious abnormality, atraumatic Eyes: conjunctivae/corneas clear. PERRL, EOM's intact. Fundi benign. Ears: normal TM's and external ear canals both ears Nose: Nares normal. Septum midline. Mucosa normal. No drainage or sinus tenderness. Throat: lips, mucosa, and tongue normal; teeth and gums normal Neck: no adenopathy, no carotid bruit, no JVD, supple, symmetrical, trachea midline and thyroid not enlarged, symmetric, no tenderness/mass/nodules Back: symmetric, no curvature. ROM normal. No CVA tenderness. Lungs: clear to auscultation bilaterally Heart: regular rate and rhythm, S1, S2 normal, no murmur, click, rub or gallop Abdomen: soft, non-tender; bowel sounds normal; no masses,  no organomegaly Extremities:  extremities normal, atraumatic, no cyanosis or edema tenderness over lateral epicondyle. No swelling or bruising. Bilateral shoulder notching.  Pulses: 2+ and symmetric Skin: Skin color, texture, turgor normal. No rashes or lesions Lymph nodes: Cervical, supraclavicular, and axillary nodes normal. Neurologic: Alert and oriented X 3, normal strength and tone. Normal symmetric reflexes. Normal coordination and gait    Assessment:    Healthy female exam.      Plan:    Marland KitchenMarland KitchenLorine was seen today for annual exam.  Diagnoses and all orders for this visit:  Routine physical examination -     Lipid Panel w/reflex Direct LDL -     TSH -     CBC with  Differential/Platelet -     COMPLETE METABOLIC PANEL WITH GFR  Screening for lipid disorders -     Lipid Panel w/reflex Direct LDL  Screening for diabetes mellitus -     COMPLETE METABOLIC PANEL WITH GFR  Class 2 obesity due to excess calories without serious comorbidity with body mass index (BMI) of 36.0 to 36.9 in adult -     TSH  Large breasts  Left tennis elbow -     diclofenac sodium (VOLTAREN) 1 % GEL; Apply 4 g topically 4 (four) times daily. To affected joint.  .. Depression screen [facility] 2/9 01/18/2018 12/27/2016  Decreased Interest 0 0  Down, Depressed, Hopeless 0 0  PHQ - 2 Score 0 0   .Marland Kitchen Discussed 150 minutes of exercise a week.  Encouraged vitamin D 1000 units and Calcium 1300mg  or 4 servings of dairy a day.  Fasting labs ordered.  Mammogram up to date.    Marland Kitchen.Discussed low carb diet with 1500 calories and 80g of protein.  Exercising at least 150 minutes a week.  My Fitness Pal could be a Microbiologist.  You are doing everything right. Keep up the good work.  Discussed medications. Pt declined for now.  Discussed large breast and how breast reduction could help her with exercise and weight loss.   Tenderness over lateral epicondyle. Sent diclofenac to rub over area. Encouraged to get OTC tennis strap. Ice and oral NsAIDs as needed as well. Follow up as needed or with sports medicine.   See After Visit Summary for Counseling Recommendations

## 2018-01-16 LAB — COMPLETE METABOLIC PANEL WITH GFR
AG Ratio: 1.4 (calc) (ref 1.0–2.5)
ALBUMIN MSPROF: 3.9 g/dL (ref 3.6–5.1)
ALT: 24 U/L (ref 6–29)
AST: 18 U/L (ref 10–35)
Alkaline phosphatase (APISO): 51 U/L (ref 33–115)
BILIRUBIN TOTAL: 0.5 mg/dL (ref 0.2–1.2)
BUN: 13 mg/dL (ref 7–25)
CALCIUM: 9.3 mg/dL (ref 8.6–10.2)
CO2: 28 mmol/L (ref 20–32)
CREATININE: 0.95 mg/dL (ref 0.50–1.10)
Chloride: 105 mmol/L (ref 98–110)
GFR, EST AFRICAN AMERICAN: 83 mL/min/{1.73_m2} (ref >=60)
GFR, EST NON AFRICAN AMERICAN: 71 mL/min/{1.73_m2} (ref >=60)
GLUCOSE: 99 mg/dL (ref 65–99)
Globulin: 2.7 g/dL (calc) (ref 1.9–3.7)
Potassium: 4.6 mmol/L (ref 3.5–5.3)
Sodium: 139 mmol/L (ref 135–146)
TOTAL PROTEIN: 6.6 g/dL (ref 6.1–8.1)

## 2018-01-16 LAB — CBC WITH DIFFERENTIAL/PLATELET
BASOS PCT: 0.8 %
Basophils Absolute: 58 cells/uL (ref 0–200)
Eosinophils Absolute: 202 cells/uL (ref 15–500)
Eosinophils Relative: 2.8 %
HCT: 43.6 % (ref 35.0–45.0)
HEMOGLOBIN: 14.8 g/dL (ref 11.7–15.5)
Lymphs Abs: 2578 cells/uL (ref 850–3900)
MCH: 29.7 pg (ref 27.0–33.0)
MCHC: 33.9 g/dL (ref 32.0–36.0)
MCV: 87.6 fL (ref 80.0–100.0)
MONOS PCT: 6 %
MPV: 9.9 fL (ref 7.5–12.5)
NEUTROS ABS: 3931 {cells}/uL (ref 1500–7800)
Neutrophils Relative %: 54.6 %
Platelets: 288 10*3/uL (ref 140–400)
RBC: 4.98 10*6/uL (ref 3.80–5.10)
RDW: 12.4 % (ref 11.0–15.0)
Total Lymphocyte: 35.8 %
WBC mixed population: 432 cells/uL (ref 200–950)
WBC: 7.2 10*3/uL (ref 3.8–10.8)

## 2018-01-16 LAB — LIPID PANEL W/REFLEX DIRECT LDL
CHOLESTEROL: 174 mg/dL (ref <200)
HDL: 57 mg/dL (ref >50)
LDL Cholesterol (Calc): 95 mg/dL (calc)
NON-HDL CHOLESTEROL (CALC): 117 mg/dL (ref <130)
Total CHOL/HDL Ratio: 3.1 (calc) (ref <5.0)
Triglycerides: 122 mg/dL (ref <150)

## 2018-01-16 LAB — TSH: TSH: 1.43 mIU/L

## 2018-01-18 ENCOUNTER — Other Ambulatory Visit (HOSPITAL_COMMUNITY): Payer: Self-pay | Admitting: Obstetrics & Gynecology

## 2018-01-18 DIAGNOSIS — N62 Hypertrophy of breast: Secondary | ICD-10-CM | POA: Insufficient documentation

## 2018-01-18 DIAGNOSIS — Z1239 Encounter for other screening for malignant neoplasm of breast: Secondary | ICD-10-CM

## 2018-01-18 NOTE — Progress Notes (Signed)
Call pt: cholesterol looks good. Thyroid looks great. Kidney, liver, glucose looks great.

## 2018-01-24 ENCOUNTER — Ambulatory Visit (INDEPENDENT_AMBULATORY_CARE_PROVIDER_SITE_OTHER): Payer: 59

## 2018-01-24 DIAGNOSIS — Z1231 Encounter for screening mammogram for malignant neoplasm of breast: Secondary | ICD-10-CM | POA: Diagnosis not present

## 2018-01-24 DIAGNOSIS — Z1239 Encounter for other screening for malignant neoplasm of breast: Secondary | ICD-10-CM

## 2018-02-22 ENCOUNTER — Ambulatory Visit: Payer: 59

## 2018-12-03 ENCOUNTER — Encounter: Payer: Self-pay | Admitting: Physician Assistant

## 2018-12-03 ENCOUNTER — Ambulatory Visit (INDEPENDENT_AMBULATORY_CARE_PROVIDER_SITE_OTHER): Payer: Self-pay | Admitting: Physician Assistant

## 2018-12-03 ENCOUNTER — Other Ambulatory Visit: Payer: Self-pay

## 2018-12-03 VITALS — BP 148/84 | HR 84 | Ht 64.0 in | Wt 213.0 lb

## 2018-12-03 DIAGNOSIS — Z13220 Encounter for screening for lipoid disorders: Secondary | ICD-10-CM

## 2018-12-03 DIAGNOSIS — R232 Flushing: Secondary | ICD-10-CM

## 2018-12-03 DIAGNOSIS — Z0000 Encounter for general adult medical examination without abnormal findings: Secondary | ICD-10-CM

## 2018-12-03 DIAGNOSIS — R718 Other abnormality of red blood cells: Secondary | ICD-10-CM

## 2018-12-03 DIAGNOSIS — Z131 Encounter for screening for diabetes mellitus: Secondary | ICD-10-CM

## 2018-12-03 DIAGNOSIS — H6981 Other specified disorders of Eustachian tube, right ear: Secondary | ICD-10-CM

## 2018-12-03 DIAGNOSIS — Z23 Encounter for immunization: Secondary | ICD-10-CM

## 2018-12-03 NOTE — Patient Instructions (Signed)
Health Maintenance, Female Adopting a healthy lifestyle and getting preventive care are important in promoting health and wellness. Ask your health care provider about:  The right schedule for you to have regular tests and exams.  Things you can do on your own to prevent diseases and keep yourself healthy. What should I know about diet, weight, and exercise? Eat a healthy diet   Eat a diet that includes plenty of vegetables, fruits, low-fat dairy products, and lean protein.  Do not eat a lot of foods that are high in solid fats, added sugars, or sodium. Maintain a healthy weight Body mass index (BMI) is used to identify weight problems. It estimates body fat based on height and weight. Your health care provider can help determine your BMI and help you achieve or maintain a healthy weight. Get regular exercise Get regular exercise. This is one of the most important things you can do for your health. Most adults should:  Exercise for at least 150 minutes each week. The exercise should increase your heart rate and make you sweat (moderate-intensity exercise).  Do strengthening exercises at least twice a week. This is in addition to the moderate-intensity exercise.  Spend less time sitting. Even light physical activity can be beneficial. Watch cholesterol and blood lipids Have your blood tested for lipids and cholesterol at 48 years of age, then have this test every 5 years. Have your cholesterol levels checked more often if:  Your lipid or cholesterol levels are high.  You are older than 48 years of age.  You are at high risk for heart disease. What should I know about cancer screening? Depending on your health history and family history, you may need to have cancer screening at various ages. This may include screening for:  Breast cancer.  Cervical cancer.  Colorectal cancer.  Skin cancer.  Lung cancer. What should I know about heart disease, diabetes, and high blood  pressure? Blood pressure and heart disease  High blood pressure causes heart disease and increases the risk of stroke. This is more likely to develop in people who have high blood pressure readings, are of African descent, or are overweight.  Have your blood pressure checked: ? Every 3-5 years if you are 18-39 years of age. ? Every year if you are 40 years old or older. Diabetes Have regular diabetes screenings. This checks your fasting blood sugar level. Have the screening done:  Once every three years after age 40 if you are at a normal weight and have a low risk for diabetes.  More often and at a younger age if you are overweight or have a high risk for diabetes. What should I know about preventing infection? Hepatitis B If you have a higher risk for hepatitis B, you should be screened for this virus. Talk with your health care provider to find out if you are at risk for hepatitis B infection. Hepatitis C Testing is recommended for:  Everyone born from 1945 through 1965.  Anyone with known risk factors for hepatitis C. Sexually transmitted infections (STIs)  Get screened for STIs, including gonorrhea and chlamydia, if: ? You are sexually active and are younger than 48 years of age. ? You are older than 48 years of age and your health care provider tells you that you are at risk for this type of infection. ? Your sexual activity has changed since you were last screened, and you are at increased risk for chlamydia or gonorrhea. Ask your health care provider if   you are at risk.  Ask your health care provider about whether you are at high risk for HIV. Your health care provider may recommend a prescription medicine to help prevent HIV infection. If you choose to take medicine to prevent HIV, you should first get tested for HIV. You should then be tested every 3 months for as long as you are taking the medicine. Pregnancy  If you are about to stop having your period (premenopausal) and  you may become pregnant, seek counseling before you get pregnant.  Take 400 to 800 micrograms (mcg) of folic acid every day if you become pregnant.  Ask for birth control (contraception) if you want to prevent pregnancy. Osteoporosis and menopause Osteoporosis is a disease in which the bones lose minerals and strength with aging. This can result in bone fractures. If you are 53 years old or older, or if you are at risk for osteoporosis and fractures, ask your health care provider if you should:  Be screened for bone loss.  Take a calcium or vitamin D supplement to lower your risk of fractures.  Be given hormone replacement therapy (HRT) to treat symptoms of menopause. Follow these instructions at home: Lifestyle  Do not use any products that contain nicotine or tobacco, such as cigarettes, e-cigarettes, and chewing tobacco. If you need help quitting, ask your health care provider.  Do not use street drugs.  Do not share needles.  Ask your health care provider for help if you need support or information about quitting drugs. Alcohol use  Do not drink alcohol if: ? Your health care provider tells you not to drink. ? You are pregnant, may be pregnant, or are planning to become pregnant.  If you drink alcohol: ? Limit how much you use to 0-1 drink a day. ? Limit intake if you are breastfeeding.  Be aware of how much alcohol is in your drink. In the [place] one drink equals one 12 oz bottle of beer (355 mL), one 5 oz glass of wine (148 mL), or one 1 oz glass of hard liquor (44 mL). General instructions  Schedule regular health, dental, and eye exams.  Stay current with your vaccines.  Tell your health care provider if: ? You often feel depressed. ? You have ever been abused or do not feel safe at home. Summary  Adopting a healthy lifestyle and getting preventive care are important in promoting health and wellness.  Follow your health care provider's instructions about healthy  diet, exercising, and getting tested or screened for diseases.  Follow your health care provider's instructions on monitoring your cholesterol and blood pressure. This information is not intended to replace advice given to you by your health care provider. Make sure you discuss any questions you have with your health care provider. Document Released: 09/05/2010 Document Revised: 02/13/2018 Document Reviewed: 02/13/2018 Elsevier Patient Education  2020 [employer]. Rosacea Rosacea is a long-term (chronic) condition that affects the skin of the face, including the cheeks, nose, forehead, and chin. This condition can also affect the eyes. Rosacea causes blood vessels near the surface of the skin to enlarge, which results in redness. What are the causes? The cause of this condition is not known. Certain triggers can make rosacea worse, including:  Hot baths.  Exercise.  Sunlight.  Very hot or cold temperatures.  Hot or spicy foods and drinks.  Drinking alcohol.  Stress.  Taking blood pressure medicine.  Long-term use of topical steroids on the face. What increases the risk?  You are more likely to develop this condition if you:  Are older than 48 years of age.  Are a woman.  Have light-colored skin (light complexion).  Have a family history of rosacea. What are the signs or symptoms? Symptoms of this condition include:  Redness of the face.  Red bumps or pimples on the face.  A red, enlarged nose.  Blushing easily.  Red lines on the skin.  Irritated, burning, or itchy feeling in the eyes.  Swollen eyelids.  Drainage from the eyes.  Feeling like there is something in your eye. How is this diagnosed? This condition is diagnosed with a medical history and physical exam. How is this treated? There is no cure for this condition, but treatment can help to control your symptoms. Your health care provider may recommend that you see a skin specialist (dermatologist).  Treatment may include:  Medicines that are applied to the skin or taken by mouth (orally). This can include antibiotic medicines.  Laser treatment to improve the appearance of the skin.  Surgery. This is rare. Your health care provider will also recommend the best way to take care of your skin. Even after your skin improves, you will likely need to continue treatment to prevent your rosacea from coming back. Follow these instructions at home: Skin care Take care of your skin as told by your health care provider. You may be told to do these things:  Wash your skin gently two or more times each day.  Use mild soap.  Use a sunscreen or sunblock with SPF 30 or greater.  Use gentle cosmetics that are meant for sensitive skin.  Shave with an electric shaver instead of a blade. Lifestyle  Try to keep track of what foods trigger this condition. Avoid any triggers. These may include: ? Spicy foods. ? Seafood. ? Cheese. ? Hot liquids. ? Nuts. ? Chocolate. ? Iodized salt.  Do not drink alcohol.  Avoid extremely cold or hot temperatures.  Try to reduce your stress. If you need help, talk with your health care provider.  When you exercise, do these things to stay cool: ? Limit sun exposure to your face. ? Use a fan. ? Do shorter and more frequent intervals of exercise. General instructions  Take and apply over-the-counter and prescription medicines only as told by your health care provider.  If you were prescribed an antibiotic medicine, apply it or take it as told by your health care provider. Do not stop using the antibiotic even if your condition improves.  If your eyelids are affected, apply warm compresses to them. Do this as told by your health care provider.  Keep all follow-up visits as told by your health care provider. This is important. Contact a health care provider if:  Your symptoms get worse.  Your symptoms do not improve after 2 months of treatment.  You  have new symptoms.  You have any changes in vision or you have problems with your eyes, such as redness or itching.  You feel depressed.  You lose your appetite.  You have trouble concentrating. Summary  Rosacea is a long-term (chronic) condition that affects the skin of the face, including the cheeks, nose, forehead, and chin.  Take care of your skin as told by your health care provider.  Take and apply over-the-counter and prescription medicines only as told by your health care provider.  Contact a health care provider if your symptoms get worse or if you have any changes in vision or  other problems with your eyes, such as redness or itching.  Keep all follow-up visits as told by your health care provider. This is important. This information is not intended to replace advice given to you by your health care provider. Make sure you discuss any questions you have with your health care provider. Document Released: 03/30/2004 Document Revised: 07/25/2017 Document Reviewed: 07/25/2017 Elsevier Patient Education  2020 [employer].

## 2018-12-03 NOTE — Progress Notes (Signed)
Subjective:     Elaine Murillo is a 48 y.o. female and is here for a comprehensive physical exam. The patient reports problems - see below. .  Pt continues to exercise 5 days a week and tries to eat healthy. She still struggles to lose weight.   She does have some right ear pain and at times hearing is muffled. Pt has hx of allergies.     Social History   Socioeconomic History  . Marital status: Married    Spouse name: Not on file  . Number of children: Not on file  . Years of education: Not on file  . Highest education level: Not on file  Occupational History  . Occupation: homemaker  Social Needs  . Financial resource strain: Not on file  . Food insecurity    Worry: Not on file    Inability: Not on file  . Transportation needs    Medical: Not on file    Non-medical: Not on file  Tobacco Use  . Smoking status: Never Smoker  . Smokeless tobacco: Never Used  Substance and Sexual Activity  . Alcohol use: No  . Drug use: No  . Sexual activity: Yes    Partners: Male    Birth control/protection: Pill  Lifestyle  . Physical activity    Days per week: Not on file    Minutes per session: Not on file  . Stress: Not on file  Relationships  . Social Herbalist on phone: Not on file    Gets together: Not on file    Attends religious service: Not on file    Active member of club or organization: Not on file    Attends meetings of clubs or organizations: Not on file    Relationship status: Not on file  . Intimate partner violence    Fear of current or ex partner: Not on file    Emotionally abused: Not on file    Physically abused: Not on file    Forced sexual activity: Not on file  Other Topics Concern  . Not on file  Social History Narrative  . Not on file   Health Maintenance  Topic Date Due  . PAP SMEAR-Modifier  12/29/2018  . MAMMOGRAM  01/25/2019  . TETANUS/TDAP  04/10/2022  . INFLUENZA VACCINE  Completed  . HIV Screening  Completed    The following  portions of the patient's history were reviewed and updated as appropriate: allergies, current medications, past family history, past medical history, past social history, past surgical history and problem list.  Review of Systems Pertinent items noted in HPI and remainder of comprehensive ROS otherwise negative.   Objective:    BP (!) 148/84   Pulse 84   Ht 5\' 4"  (1.626 m)   Wt 213 lb (96.6 kg)   SpO2 99%   BMI 36.56 kg/m  General appearance: alert, cooperative, appears stated age and mildly obese Head: Normocephalic, without obvious abnormality, atraumatic Eyes: conjunctivae/corneas clear. PERRL, EOM's intact. Fundi benign. Ears: normal TM's and external ear canals both ears Nose: Nares normal. Septum midline. Mucosa normal. No drainage or sinus tenderness. Throat: lips, mucosa, and tongue normal; teeth and gums normal Neck: no adenopathy, no carotid bruit, no JVD, supple, symmetrical, trachea midline and thyroid not enlarged, symmetric, no tenderness/mass/nodules Back: symmetric, no curvature. ROM normal. No CVA tenderness. Lungs: clear to auscultation bilaterally Heart: regular rate and rhythm, S1, S2 normal, no murmur, click, rub or gallop Abdomen: soft, non-tender; bowel sounds  normal; no masses,  no organomegaly Extremities: extremities normal, atraumatic, no cyanosis or edema Pulses: 2+ and symmetric Skin: Skin color, texture, turgor normal. No rashes or lesions flushed cheeks and nose.  Lymph nodes: Cervical, supraclavicular, and axillary nodes normal. Neurologic: Alert and oriented X 3, normal strength and tone. Normal symmetric reflexes. Normal coordination and gait    Assessment:    Healthy female exam.      Plan:     Marland KitchenMarland KitchenJoy was seen today for annual exam.  Diagnoses and all orders for this visit:  Routine physical examination -     Lipid Panel w/reflex Direct LDL -     CBC with Differential/Platelet -     COMPLETE METABOLIC PANEL WITH GFR -     TSH  Flu  vaccine need -     Flu Vaccine QUAD 36+ mos IM  Screening for diabetes mellitus -     COMPLETE METABOLIC PANEL WITH GFR  Screening for lipid disorders -     Lipid Panel w/reflex Direct LDL  ETD (Eustachian tube dysfunction), right  Facial flushing -     Ferritin -     Erythropoietin  Abnormal RBC -     Ferritin -     Erythropoietin   .Marland Kitchen Depression screen [facility] 2/9 12/03/2018 01/18/2018 12/27/2016  Decreased Interest 0 0 0  Down, Depressed, Hopeless 0 0 0  PHQ - 2 Score 0 0 0  Altered sleeping 0 - -  Tired, decreased energy 1 - -  Change in appetite 1 - -  Feeling bad or failure about yourself  0 - -  Trouble concentrating 0 - -  Moving slowly or fidgety/restless 0 - -  Suicidal thoughts 0 - -  PHQ-9 Score 2 - -  Difficult doing work/chores Not difficult at all - -   .Marland Kitchen Discussed 150 minutes of exercise a week.  Encouraged vitamin D 1000 units and Calcium 1300mg  or 4 servings of dairy a day.  Fasting labs ordered.  Mammogram UTD. Pap UTD.  Flu shot given.   ETD, right. Start flonase. Visible fluid behind right TM. Could benefit from prednisone if not improving. Consider some decongestants.   Discussed flushing. I suspect she has rosacea. HO given.    See After Visit Summary for Counseling Recommendations

## 2018-12-04 NOTE — Progress Notes (Signed)
Elaine Murillo,   LDL under 100 which is fantastic. HDL above 50. GREAT. TG a little elevated at 165. Make sure taking fish oil can help keep TG low.   Thyroid normal! Kidney, liver, glucose looks great.   Your RBC just a little high and blood just a hair thick. Please add ferritin, erythropoietin.  Do you give blood regularly? If not this could help keep levels lower. Some of your symptoms(itchy and flushing) are consistent with polycythemia vera. Which is making more red blood cells.

## 2018-12-06 LAB — CBC WITH DIFFERENTIAL/PLATELET
Absolute Monocytes: 423 cells/uL (ref 200–950)
Basophils Absolute: 50 cells/uL (ref 0–200)
Basophils Relative: 0.6 %
Eosinophils Absolute: 174 cells/uL (ref 15–500)
Eosinophils Relative: 2.1 %
HCT: 46 % — ABNORMAL HIGH (ref 35.0–45.0)
Hemoglobin: 15.2 g/dL (ref 11.7–15.5)
Lymphs Abs: 2606 cells/uL (ref 850–3900)
MCH: 29.7 pg (ref 27.0–33.0)
MCHC: 33 g/dL (ref 32.0–36.0)
MCV: 90 fL (ref 80.0–100.0)
MPV: 10.1 fL (ref 7.5–12.5)
Monocytes Relative: 5.1 %
Neutro Abs: 5046 cells/uL (ref 1500–7800)
Neutrophils Relative %: 60.8 %
Platelets: 324 10*3/uL (ref 140–400)
RBC: 5.11 10*6/uL — ABNORMAL HIGH (ref 3.80–5.10)
RDW: 12.5 % (ref 11.0–15.0)
Total Lymphocyte: 31.4 %
WBC: 8.3 10*3/uL (ref 3.8–10.8)

## 2018-12-06 LAB — COMPLETE METABOLIC PANEL WITH GFR
AG Ratio: 1.5 (calc) (ref 1.0–2.5)
ALT: 12 U/L (ref 6–29)
AST: 14 U/L (ref 10–35)
Albumin: 4 g/dL (ref 3.6–5.1)
Alkaline phosphatase (APISO): 41 U/L (ref 31–125)
BUN: 11 mg/dL (ref 7–25)
CO2: 25 mmol/L (ref 20–32)
Calcium: 9.4 mg/dL (ref 8.6–10.2)
Chloride: 105 mmol/L (ref 98–110)
Creat: 1.05 mg/dL (ref 0.50–1.10)
GFR, Est African American: 73 mL/min/{1.73_m2} (ref >=60)
GFR, Est Non African American: 63 mL/min/{1.73_m2} (ref >=60)
Globulin: 2.7 g/dL (calc) (ref 1.9–3.7)
Glucose, Bld: 93 mg/dL (ref 65–99)
Potassium: 4.9 mmol/L (ref 3.5–5.3)
Sodium: 138 mmol/L (ref 135–146)
Total Bilirubin: 0.4 mg/dL (ref 0.2–1.2)
Total Protein: 6.7 g/dL (ref 6.1–8.1)

## 2018-12-06 LAB — LIPID PANEL W/REFLEX DIRECT LDL
Cholesterol: 177 mg/dL (ref <200)
HDL: 55 mg/dL (ref >=50)
LDL Cholesterol (Calc): 96 mg/dL (calc)
Non-HDL Cholesterol (Calc): 122 mg/dL (calc) (ref <130)
Total CHOL/HDL Ratio: 3.2 (calc) (ref <5.0)
Triglycerides: 165 mg/dL — ABNORMAL HIGH (ref <150)

## 2018-12-06 LAB — FERRITIN: Ferritin: 60 ng/mL (ref 16–232)

## 2018-12-06 LAB — TSH: TSH: 1.49 mIU/L

## 2018-12-06 LAB — ERYTHROPOIETIN: Erythropoietin: 5.1 m[IU]/mL (ref 2.6–18.5)

## 2018-12-08 DIAGNOSIS — R718 Other abnormality of red blood cells: Secondary | ICD-10-CM | POA: Insufficient documentation

## 2018-12-08 DIAGNOSIS — R232 Flushing: Secondary | ICD-10-CM | POA: Insufficient documentation

## 2018-12-09 NOTE — Progress Notes (Signed)
Erythopoetin normal range but on low side. Ferritin normal. No signs that make polycytemia vera overtly positive but does not completely rule out. Either way no major concerns with this slight RBC elevation. Will continue to monitor. If RBC become much higher would consider more treatment. For now giving blood is a great option to keep RBC lower.

## 2019-02-13 ENCOUNTER — Other Ambulatory Visit: Payer: Self-pay | Admitting: Obstetrics & Gynecology

## 2019-02-13 DIAGNOSIS — Z1231 Encounter for screening mammogram for malignant neoplasm of breast: Secondary | ICD-10-CM

## 2019-02-27 ENCOUNTER — Ambulatory Visit (INDEPENDENT_AMBULATORY_CARE_PROVIDER_SITE_OTHER): Payer: BC Managed Care – PPO

## 2019-02-27 ENCOUNTER — Other Ambulatory Visit: Payer: Self-pay

## 2019-02-27 DIAGNOSIS — Z1231 Encounter for screening mammogram for malignant neoplasm of breast: Secondary | ICD-10-CM | POA: Diagnosis not present

## 2019-05-01 ENCOUNTER — Other Ambulatory Visit: Payer: Self-pay | Admitting: *Deleted

## 2019-05-01 MED ORDER — LEVONORGEST-ETH ESTRAD 91-DAY 0.15-0.03 &0.01 MG PO TABS: 1.0000 | ORAL_TABLET | Freq: Every day | ORAL | 0 refills | Status: DC

## 2019-05-19 DIAGNOSIS — J3089 Other allergic rhinitis: Secondary | ICD-10-CM | POA: Diagnosis not present

## 2019-05-19 DIAGNOSIS — J301 Allergic rhinitis due to pollen: Secondary | ICD-10-CM | POA: Diagnosis not present

## 2019-05-19 DIAGNOSIS — R232 Flushing: Secondary | ICD-10-CM | POA: Diagnosis not present

## 2019-05-19 DIAGNOSIS — J3081 Allergic rhinitis due to animal (cat) (dog) hair and dander: Secondary | ICD-10-CM | POA: Diagnosis not present

## 2019-05-19 DIAGNOSIS — L299 Pruritus, unspecified: Secondary | ICD-10-CM | POA: Diagnosis not present

## 2019-05-27 ENCOUNTER — Encounter: Payer: Self-pay | Admitting: Advanced Practice Midwife

## 2019-05-27 ENCOUNTER — Other Ambulatory Visit: Payer: Self-pay

## 2019-05-27 ENCOUNTER — Ambulatory Visit (INDEPENDENT_AMBULATORY_CARE_PROVIDER_SITE_OTHER): Payer: BC Managed Care – PPO | Admitting: Advanced Practice Midwife

## 2019-05-27 VITALS — BP 148/86 | HR 86 | Temp 98.7°F | Resp 16 | Ht 64.0 in | Wt 215.0 lb

## 2019-05-27 DIAGNOSIS — Z1151 Encounter for screening for human papillomavirus (HPV): Secondary | ICD-10-CM

## 2019-05-27 DIAGNOSIS — Z3009 Encounter for other general counseling and advice on contraception: Secondary | ICD-10-CM

## 2019-05-27 DIAGNOSIS — I10 Essential (primary) hypertension: Secondary | ICD-10-CM | POA: Diagnosis not present

## 2019-05-27 DIAGNOSIS — Z124 Encounter for screening for malignant neoplasm of cervix: Secondary | ICD-10-CM | POA: Diagnosis not present

## 2019-05-27 DIAGNOSIS — Z975 Presence of (intrauterine) contraceptive device: Secondary | ICD-10-CM

## 2019-05-27 DIAGNOSIS — Z3202 Encounter for pregnancy test, result negative: Secondary | ICD-10-CM

## 2019-05-27 DIAGNOSIS — Z30430 Encounter for insertion of intrauterine contraceptive device: Secondary | ICD-10-CM | POA: Diagnosis not present

## 2019-05-27 DIAGNOSIS — Z01419 Encounter for gynecological examination (general) (routine) without abnormal findings: Secondary | ICD-10-CM

## 2019-05-27 LAB — POCT URINE PREGNANCY: Preg Test, Ur: NEGATIVE

## 2019-05-27 MED ORDER — LEVONORGESTREL 19.5 MCG/DAY IU IUD: INTRAUTERINE_SYSTEM | Freq: Once | INTRAUTERINE | Status: AC

## 2019-05-27 NOTE — Progress Notes (Signed)
Subjective:     Elaine Murillo is a 49 y.o. female here at [facility] for a routine exam.  Personal health questionnaire reviewed: yes. Patient presents with concerns about her current estrogen/progestin OCP, she is currently on a 3 month cycle. Menstruation is light but she is seeking other options than the daily pill. She complains of vaginal discharge during menstruation. Patient stopped her OCP for a month but has continued the three month cycle starting in Feb 2021.  Do you have a primary care provider? Yes, Iran Planas, PA-C How many times per week do you [facility]    Gynecologic History Patient's last menstrual period was 04/16/2019. Contraception: OCP (estrogen/progesterone) Last Pap: 2017. Results were: normal Last mammogram: 2020. Results were: normal  Obstetric History OB History  Gravida Para Term Preterm AB Living  2 2 2     2   SAB TAB Ectopic Multiple Live Births          2    # Outcome Date GA Lbr Len/2nd Weight Sex Delivery Anes PTL Lv  2 Term 06/23/12 [redacted]w[redacted]d 01:48 / 00:04 4252 g M Vag-Spont None  LIV  1 Term 10/22/07 [redacted]w[redacted]d  3204 g F Vag-Spont EPI N LIV    Review of Systems Pertinent items noted in HPI and remainder of comprehensive ROS otherwise negative.    Objective:    BP (!) 148/86   Pulse 86   Temp 98.7 F (37.1 C)   Resp 16   Ht 5\' 4"  (1.626 m)   Wt 97.5 kg   LMP 04/16/2019   BMI 36.90 kg/m   VS reviewed, nursing note reviewed,  Constitutional: well developed, well nourished, no distress HEENT: normocephalic CV: normal rate Pulm/chest wall: normal effort Breast Exam:  right breast normal without mass, skin or nipple changes or axillary nodes, left breast normal without mass, skin or nipple changes or axillary nodes Abdomen: soft Neuro: alert and oriented x 3 Skin: warm, dry Psych: affect normal Pelvic exam: Cervix pink, visually closed, without lesion, scant white creamy discharge, vaginal walls and external genitalia  normal Bimanual exam: Cervix 0/long/high, firm, anterior, neg CMT, uterus nontender, nonenlarged, adnexa without tenderness, enlargement, or mass     Assessment/Plan:   1. Encounter for IUD insertion - POCT urine pregnancy  2. Well woman exam with routine gynecological exam  - Cytology - PAP( [place])     Follow up in: 4 week or as needed.   Irven Shelling, Student-PA 10:29 AM   Attestation of Supervision of Student:  I confirm that I have verified the information documented in the physician assistant student's note and that I have also personally performed the history, physical exam and all medical decision making activities.  I have verified that all services and findings are accurately documented in this student's note; and I agree with management and plan as outlined in the documentation. I have also made any necessary editorial changes.  See procedure note and assessment and plan below:  IUD Procedure Note Patient identified, informed consent performed.  Discussed risks of irregular bleeding, cramping, infection, malpositioning or misplacement of the IUD outside the uterus which may require further procedures. Time out was performed.  Urine pregnancy test negative.  Speculum placed in the vagina.  Cervix visualized.  Cleaned with Betadine x 2.  Grasped anteriorly with a single tooth tenaculum.  Uterus sounded to 7 cm.  Liletta IUD placed per manufacturer's recommendations.  Strings trimmed to 3 cm. Tenaculum was removed,  good hemostasis noted.  Patient tolerated procedure well.   Patient was given post-procedure instructions and the Liletta care card with expiration date.  Patient was also asked to check IUD strings periodically and follow up in 4-6 weeks for IUD check.   1. Encounter for IUD insertion --See procedure note above --Since pt on continuous OCPs, finish this pack (1.5 months) then stop OCPs for better bleeding pattern. May stop pills now but may have  AUB. - POCT urine pregnancy  2. Well woman exam with routine gynecological exam --Doing well,  Has used continuous OCPs for years, wants to stop taking pill daily and is concerned about estrogen risks due to her age. Now has intermittent HTN, so knows there is more risk with estrogen.    - Cytology - PAP( ) --Screening mammogram ordered  3. Hypertension, unspecified type --HTN in office today, record of HTN in 2018. Pt reports she takes BP at home, and at the [facility] often and it is usually 130s/80s so borderline.   --Continue to follow up with PCP --Change in contraception may lower BP  4. Encounter for counseling regarding contraception Discussed LARCs as most effective forms of birth control.  Discussed benefits/risks of other methods.  Pt desires Liletta IUD.      Fatima Blank, [place] Certified [employer] for [name], [place] [employer] 05/27/2019 12:04 PM

## 2019-05-29 LAB — CYTOLOGY - PAP
Comment: NEGATIVE
Diagnosis: NEGATIVE
High risk HPV: NEGATIVE

## 2019-06-18 DIAGNOSIS — L209 Atopic dermatitis, unspecified: Secondary | ICD-10-CM | POA: Diagnosis not present

## 2019-06-18 DIAGNOSIS — L719 Rosacea, unspecified: Secondary | ICD-10-CM | POA: Diagnosis not present

## 2019-06-24 ENCOUNTER — Ambulatory Visit (INDEPENDENT_AMBULATORY_CARE_PROVIDER_SITE_OTHER): Payer: BC Managed Care – PPO | Admitting: Advanced Practice Midwife

## 2019-06-24 ENCOUNTER — Other Ambulatory Visit: Payer: Self-pay

## 2019-06-24 ENCOUNTER — Encounter: Payer: Self-pay | Admitting: Advanced Practice Midwife

## 2019-06-24 VITALS — BP 155/95 | HR 78 | Temp 98.6°F | Ht 64.0 in | Wt 215.0 lb

## 2019-06-24 DIAGNOSIS — Z30431 Encounter for routine checking of intrauterine contraceptive device: Secondary | ICD-10-CM

## 2019-06-24 NOTE — Progress Notes (Signed)
   GYNECOLOGY CLINIC PROGRESS NOTE  History:  49 y.o. [id number] here at [facility] today for today for IUD string check; Liletta IUD was placed  05/27/19. No complaints about the IUD, no concerning side effects.  Pt is completing her OCP course in 1 week, then plans to stop OCPs.  BP has been elevated at recent visits, and when taking it at home.    The following portions of the patient's history were reviewed and updated as appropriate: allergies, current medications, past family history, past medical history, past social history, past surgical history and problem list. Last pap smear on 05/27/19 was normal, negative HRHPV.  Review of Systems:  Pertinent items are noted in HPI.   Objective:  Physical Exam Blood pressure (!) 155/95, pulse 78, temperature 98.6 F (37 C), height 5\' 4"  (1.626 m), weight 215 lb (97.5 kg). Gen: NAD Abd: Soft, nontender and nondistended Pelvic: Normal appearing external genitalia; normal appearing vaginal mucosa and cervix.  IUD strings visualized, about 3 cm in length outside cervix.   Assessment & Plan:  Normal IUD check. Patient to keep IUD in place for 6 years; can come in for removal if she desires pregnancy within the next 6 years. Routine preventative health maintenance measures emphasized.  Fatima Blank, CNM 10:09 AM

## 2019-06-24 NOTE — Patient Instructions (Signed)
DASH Eating Plan DASH stands for "Dietary Approaches to Stop Hypertension." The DASH eating plan is a healthy eating plan that has been shown to reduce high blood pressure (hypertension). It may also reduce your risk for type 2 diabetes, heart disease, and stroke. The DASH eating plan may also help with weight loss. What are tips for following this plan?  General guidelines  Avoid eating more than 2,300 mg (milligrams) of salt (sodium) a day. If you have hypertension, you may need to reduce your sodium intake to 1,500 mg a day.  Limit alcohol intake to no more than 1 drink a day for nonpregnant women and 2 drinks a day for men. One drink equals 12 oz of beer, 5 oz of wine, or 1 oz of hard liquor.  Work with your health care provider to maintain a healthy body weight or to lose weight. Ask what an ideal weight is for you.  Get at least 30 minutes of exercise that causes your heart to beat faster (aerobic exercise) most days of the week. Activities may include walking, swimming, or biking.  Work with your health care provider or diet and nutrition specialist (dietitian) to adjust your eating plan to your individual calorie needs. Reading food labels   Check food labels for the amount of sodium per serving. Choose foods with less than 5 percent of the Daily Value of sodium. Generally, foods with less than 300 mg of sodium per serving fit into this eating plan.  To find whole grains, look for the word "whole" as the first word in the ingredient list. Shopping  Buy products labeled as "low-sodium" or "no salt added."  Buy fresh foods. Avoid canned foods and premade or frozen meals. Cooking  Avoid adding salt when cooking. Use salt-free seasonings or herbs instead of table salt or sea salt. Check with your health care provider or pharmacist before using salt substitutes.  Do not fry foods. Cook foods using healthy methods such as baking, boiling, grilling, and broiling instead.  Cook with  heart-healthy oils, such as olive, canola, soybean, or sunflower oil. Meal planning  Eat a balanced diet that includes: ? 5 or more servings of fruits and vegetables each day. At each meal, try to fill half of your plate with fruits and vegetables. ? Up to 6-8 servings of whole grains each day. ? Less than 6 oz of lean meat, poultry, or fish each day. A 3-oz serving of meat is about the same size as a deck of cards. One egg equals 1 oz. ? 2 servings of low-fat dairy each day. ? A serving of nuts, seeds, or beans 5 times each week. ? Heart-healthy fats. Healthy fats called Omega-3 fatty acids are found in foods such as flaxseeds and coldwater fish, like sardines, salmon, and mackerel.  Limit how much you eat of the following: ? Canned or prepackaged foods. ? Food that is high in trans fat, such as fried foods. ? Food that is high in saturated fat, such as fatty meat. ? Sweets, desserts, sugary drinks, and other foods with added sugar. ? Full-fat dairy products.  Do not salt foods before eating.  Try to eat at least 2 vegetarian meals each week.  Eat more home-cooked food and less restaurant, buffet, and fast food.  When eating at a restaurant, ask that your food be prepared with less salt or no salt, if possible. What foods are recommended? The items listed may not be a complete list. Talk with your dietitian about   what dietary choices are best for you. Grains Whole-grain or whole-wheat bread. Whole-grain or whole-wheat pasta. Brown rice. Oatmeal. Quinoa. Bulgur. Whole-grain and low-sodium cereals. Pita bread. Low-fat, low-sodium crackers. Whole-wheat flour tortillas. Vegetables Fresh or frozen vegetables (raw, steamed, roasted, or grilled). Low-sodium or reduced-sodium tomato and vegetable juice. Low-sodium or reduced-sodium tomato sauce and tomato paste. Low-sodium or reduced-sodium canned vegetables. Fruits All fresh, dried, or frozen fruit. Canned fruit in natural juice (without  added sugar). Meat and other protein foods Skinless chicken or turkey. Ground chicken or turkey. Pork with fat trimmed off. Fish and seafood. Egg whites. Dried beans, peas, or lentils. Unsalted nuts, nut butters, and seeds. Unsalted canned beans. Lean cuts of beef with fat trimmed off. Low-sodium, lean deli meat. Dairy Low-fat (1%) or fat-free (skim) milk. Fat-free, low-fat, or reduced-fat cheeses. Nonfat, low-sodium ricotta or cottage cheese. Low-fat or nonfat yogurt. Low-fat, low-sodium cheese. Fats and oils Soft margarine without trans fats. Vegetable oil. Low-fat, reduced-fat, or light mayonnaise and salad dressings (reduced-sodium). Canola, safflower, olive, soybean, and sunflower oils. Avocado. Seasoning and other foods Herbs. Spices. Seasoning mixes without salt. Unsalted popcorn and pretzels. Fat-free sweets. What foods are not recommended? The items listed may not be a complete list. Talk with your dietitian about what dietary choices are best for you. Grains Baked goods made with fat, such as croissants, muffins, or some breads. Dry pasta or rice meal packs. Vegetables Creamed or fried vegetables. Vegetables in a cheese sauce. Regular canned vegetables (not low-sodium or reduced-sodium). Regular canned tomato sauce and paste (not low-sodium or reduced-sodium). Regular tomato and vegetable juice (not low-sodium or reduced-sodium). Pickles. Olives. Fruits Canned fruit in a light or heavy syrup. Fried fruit. Fruit in cream or butter sauce. Meat and other protein foods Fatty cuts of meat. Ribs. Fried meat. Bacon. Sausage. Bologna and other processed lunch meats. Salami. Fatback. Hotdogs. Bratwurst. Salted nuts and seeds. Canned beans with added salt. Canned or smoked fish. Whole eggs or egg yolks. Chicken or turkey with skin. Dairy Whole or 2% milk, cream, and half-and-half. Whole or full-fat cream cheese. Whole-fat or sweetened yogurt. Full-fat cheese. Nondairy creamers. Whipped toppings.  Processed cheese and cheese spreads. Fats and oils Butter. Stick margarine. Lard. Shortening. Ghee. Bacon fat. Tropical oils, such as coconut, palm kernel, or palm oil. Seasoning and other foods Salted popcorn and pretzels. Onion salt, garlic salt, seasoned salt, table salt, and sea salt. Worcestershire sauce. Tartar sauce. Barbecue sauce. Teriyaki sauce. Soy sauce, including reduced-sodium. Steak sauce. Canned and packaged gravies. Fish sauce. Oyster sauce. Cocktail sauce. Horseradish that you find on the shelf. Ketchup. Mustard. Meat flavorings and tenderizers. Bouillon cubes. Hot sauce and Tabasco sauce. Premade or packaged marinades. Premade or packaged taco seasonings. Relishes. Regular salad dressings. Where to find more information:  National Heart, Lung, and Blood Institute: www.nhlbi.nih.gov  American Heart Association: www.heart.org Summary  The DASH eating plan is a healthy eating plan that has been shown to reduce high blood pressure (hypertension). It may also reduce your risk for type 2 diabetes, heart disease, and stroke.  With the DASH eating plan, you should limit salt (sodium) intake to 2,300 mg a day. If you have hypertension, you may need to reduce your sodium intake to 1,500 mg a day.  When on the DASH eating plan, aim to eat more fresh fruits and vegetables, whole grains, lean proteins, low-fat dairy, and heart-healthy fats.  Work with your health care provider or diet and nutrition specialist (dietitian) to adjust your eating plan to your   individual calorie needs. This information is not intended to replace advice given to you by your health care provider. Make sure you discuss any questions you have with your health care provider. Document Revised: 02/02/2017 Document Reviewed: 02/14/2016 Elsevier Patient Education  2020 Elsevier Inc.  

## 2019-07-15 DIAGNOSIS — L720 Epidermal cyst: Secondary | ICD-10-CM | POA: Diagnosis not present

## 2019-07-15 DIAGNOSIS — L811 Chloasma: Secondary | ICD-10-CM | POA: Diagnosis not present

## 2019-07-15 DIAGNOSIS — L209 Atopic dermatitis, unspecified: Secondary | ICD-10-CM | POA: Diagnosis not present

## 2019-07-15 DIAGNOSIS — L719 Rosacea, unspecified: Secondary | ICD-10-CM | POA: Diagnosis not present

## 2019-09-04 ENCOUNTER — Other Ambulatory Visit: Payer: Self-pay

## 2019-09-04 ENCOUNTER — Encounter: Payer: Self-pay | Admitting: Family Medicine

## 2019-09-04 ENCOUNTER — Ambulatory Visit (INDEPENDENT_AMBULATORY_CARE_PROVIDER_SITE_OTHER): Payer: BC Managed Care – PPO

## 2019-09-04 ENCOUNTER — Ambulatory Visit (INDEPENDENT_AMBULATORY_CARE_PROVIDER_SITE_OTHER): Payer: BC Managed Care – PPO | Admitting: Family Medicine

## 2019-09-04 VITALS — BP 147/88 | HR 93 | Ht 64.17 in | Wt 215.5 lb

## 2019-09-04 DIAGNOSIS — M25551 Pain in right hip: Secondary | ICD-10-CM

## 2019-09-04 DIAGNOSIS — M1611 Unilateral primary osteoarthritis, right hip: Secondary | ICD-10-CM | POA: Insufficient documentation

## 2019-09-04 DIAGNOSIS — M79641 Pain in right hand: Secondary | ICD-10-CM | POA: Diagnosis not present

## 2019-09-04 DIAGNOSIS — D259 Leiomyoma of uterus, unspecified: Secondary | ICD-10-CM | POA: Diagnosis not present

## 2019-09-04 DIAGNOSIS — M79643 Pain in unspecified hand: Secondary | ICD-10-CM | POA: Insufficient documentation

## 2019-09-04 MED ORDER — DICLOFENAC SODIUM 75 MG PO TBEC
75.0000 mg | DELAYED_RELEASE_TABLET | Freq: Two times a day (BID) | ORAL | 0 refills | Status: DC | PRN
Start: 2019-09-04 — End: 2019-10-21

## 2019-09-04 NOTE — Progress Notes (Signed)
Tonita Bills - 49 y.o. female MRN [id number]  Date of birth: 1970-11-29  Subjective Chief Complaint  Patient presents with  . Hip Pain    HPI Annice Jolly is a 49 y.o. female here today with complaint of R hip pain.  She reports that pain started a couple of weeks ago.  Has been walking more at the gym and this tends to make this worse.  She does not really have pain if cycling but water exercises do cause pain.  Pain is located over lateral hip with some pain in groin as well.  She denies associated back pain and has not had any radiation of pain down the legs.  She has had some relief with aleve.  She has not had any locking sensation of the hip.    She also has had sensation of ring finger on R hand "catching" on the dorsum of the finger.  She denies pain with this.    ROS:  A comprehensive ROS was completed and negative except as noted per HPI  No Known Allergies  Past Medical History:  Diagnosis Date  . Anxiety   . Uterine fibroids affecting pregnancy     Past Surgical History:  Procedure Laterality Date  . TONSILLECTOMY      Social History   Socioeconomic History  . Marital status: Married    Spouse name: Not on file  . Number of children: Not on file  . Years of education: Not on file  . Highest education level: Not on file  Occupational History  . Occupation: homemaker  Tobacco Use  . Smoking status: Never Smoker  . Smokeless tobacco: Never Used  Vaping Use  . Vaping Use: Never used  Substance and Sexual Activity  . Alcohol use: No  . Drug use: No  . Sexual activity: Yes    Partners: Male    Birth control/protection: I.U.D.  Other Topics Concern  . Not on file  Social History Narrative  . Not on file   Social Determinants of Health   Financial Resource Strain:   . Difficulty of Paying Living Expenses:   Food Insecurity:   . Worried About Charity fundraiser in the Last Year:   . Arboriculturist in the Last Year:   Transportation Needs:   . Lexicographer (Medical):   Marland Kitchen Lack of Transportation (Non-Medical):   Physical Activity:   . Days of Exercise per Week:   . Minutes of Exercise per Session:   Stress:   . Feeling of Stress :   Social Connections:   . Frequency of Communication with Friends and Family:   . Frequency of Social Gatherings with Friends and Family:   . Attends Religious Services:   . Active Member of Clubs or Organizations:   . Attends Archivist Meetings:   Marland Kitchen Marital Status:     Family History  Problem Relation Age of Onset  . Stroke Paternal Grandmother     Health Maintenance  Topic Date Due  . Hepatitis C Screening  Never done  . INFLUENZA VACCINE  10/05/2019  . MAMMOGRAM  02/27/2020  . TETANUS/TDAP  04/10/2022  . PAP SMEAR-Modifier  05/27/2022  . COVID-19 Vaccine  Completed  . HIV Screening  Completed     ----------------------------------------------------------------------------------------------------------------------------------------------------------------------------------------------------------------- Physical Exam BP (!) 147/88 (BP Location: Left Arm, Patient Position: Sitting, Cuff Size: Normal)   Pulse 93   Ht 5' 4.17" (1.63 m)   Wt 215 lb 8 oz (97.8 kg)  SpO2 100%   BMI 36.79 kg/m   Physical Exam Constitutional:      Appearance: Normal appearance.  HENT:     Head: Normocephalic and atraumatic.  Musculoskeletal:     Comments: R hip normal to inspection and palpation.  No tenderness over greater trochanter. Mild pain with flexion.  Weakness of hip abductors on R.    Skin:    General: Skin is warm and dry.  Neurological:     Mental Status: She is alert.     ------------------------------------------------------------------------------------------------------------------------------------------------------------------------------------------------------------------- Assessment and Plan  Right hip pain Xrays of hip ordered.  Rx for diclofenac 75mg   BID Given handout for home exercises and theraband to strengthen hip abductors.  F/u in 4-6 weeks.    Hand pain Trial of voltaren gel to affected area.     Meds ordered this encounter  Medications  . diclofenac (VOLTAREN) 75 MG EC tablet    Sig: Take 1 tablet (75 mg total) by mouth 2 (two) times daily as needed.    Dispense:  30 tablet    Refill:  0    No follow-ups on file.    This visit occurred during the SARS-CoV-2 public health emergency.  Safety protocols were in place, including screening questions prior to the visit, additional usage of staff PPE, and extensive cleaning of exam room while observing appropriate contact time as indicated for disinfecting solutions.

## 2019-09-04 NOTE — Progress Notes (Signed)
GGGF RR R  hh

## 2019-09-04 NOTE — Patient Instructions (Signed)
Very nice to meet you today! Try strengthening exercises.  Try diclofenac as needed for pain Try voltaren gel to hand Call for follow up if not improving.

## 2019-09-04 NOTE — Assessment & Plan Note (Signed)
Trial of voltaren gel to affected area.

## 2019-09-04 NOTE — Assessment & Plan Note (Addendum)
Xrays of hip ordered.  Rx for diclofenac 75mg  BID Given handout for home exercises and theraband to strengthen hip abductors.  F/u in 4-6 weeks.

## 2019-09-04 NOTE — Telephone Encounter (Signed)
Elaine Murillo wanted to know if the diclofenac gel was OTC.  Advised her she could pick up the gel OTC.   Requested imaging results.  Advised negative. Will callback with official reading response from Dr. Zigmund Daniel.

## 2019-09-10 DIAGNOSIS — J301 Allergic rhinitis due to pollen: Secondary | ICD-10-CM | POA: Diagnosis not present

## 2019-10-07 DIAGNOSIS — J301 Allergic rhinitis due to pollen: Secondary | ICD-10-CM | POA: Diagnosis not present

## 2019-10-09 DIAGNOSIS — J301 Allergic rhinitis due to pollen: Secondary | ICD-10-CM | POA: Diagnosis not present

## 2019-10-13 DIAGNOSIS — J301 Allergic rhinitis due to pollen: Secondary | ICD-10-CM | POA: Diagnosis not present

## 2019-10-16 DIAGNOSIS — J301 Allergic rhinitis due to pollen: Secondary | ICD-10-CM | POA: Diagnosis not present

## 2019-10-20 DIAGNOSIS — J301 Allergic rhinitis due to pollen: Secondary | ICD-10-CM | POA: Diagnosis not present

## 2019-10-21 ENCOUNTER — Ambulatory Visit (INDEPENDENT_AMBULATORY_CARE_PROVIDER_SITE_OTHER): Payer: BC Managed Care – PPO | Admitting: Family Medicine

## 2019-10-21 ENCOUNTER — Encounter: Payer: Self-pay | Admitting: Family Medicine

## 2019-10-21 ENCOUNTER — Other Ambulatory Visit: Payer: Self-pay

## 2019-10-21 VITALS — BP 139/87 | HR 80 | Temp 98.6°F | Wt 215.1 lb

## 2019-10-21 DIAGNOSIS — Z111 Encounter for screening for respiratory tuberculosis: Secondary | ICD-10-CM

## 2019-10-21 DIAGNOSIS — M79641 Pain in right hand: Secondary | ICD-10-CM

## 2019-10-21 DIAGNOSIS — M25551 Pain in right hip: Secondary | ICD-10-CM | POA: Diagnosis not present

## 2019-10-21 MED ORDER — DICLOFENAC SODIUM 75 MG PO TBEC: 75.0000 mg | DELAYED_RELEASE_TABLET | Freq: Two times a day (BID) | ORAL | 1 refills | Status: DC | PRN

## 2019-10-21 NOTE — Assessment & Plan Note (Signed)
Improves with diclofenac.  Can consider auto-immune work up if this persists.

## 2019-10-21 NOTE — Assessment & Plan Note (Signed)
Improved some since last visit Continue diclofenac as needed, renewed.  Will add formal PT.  Recommend visit with Dr. Darene Lamer if this continues to not improve.

## 2019-10-21 NOTE — Patient Instructions (Signed)
You will be contacted to set up appt for physical therapy.  I would recommend seeing Dr. Darene Lamer as well.  Have labs completed for TB test.  You will be able to print this result from [facility] and attach to your form.

## 2019-10-21 NOTE — Progress Notes (Signed)
Elaine Murillo - 49 y.o. female MRN [id number]  Date of birth: 03-27-1970  Subjective Chief Complaint  Patient presents with  . Hip Pain    Right. Pain has increased since last visit.   . Hand Pain    no changes.  . Medication Refill    Diclofenac Sodium    HPI Elaine Murillo is a 49 y.o. female here today for follow up of R hip pain and R hand pain.  R hip pain has improved some since last visit.  Pain remains along posterior and later portion of hip.  It does still come and go and varies by day.  She has found diclofenac very helpful and is able to complete normal activities and exercise if taking this.  Home exercise help some but isn't sure if they have provided a whole lot of improvement.    Hand still with some stiffness.  Diclofenac does help with this as well.  No numbness/tingling.   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  She does need updated TB test for this.   ROS:  A comprehensive ROS was completed and negative except as noted per HPI    No Known Allergies  Past Medical History:  Diagnosis Date  . Anxiety   . Uterine fibroids affecting pregnancy     Past Surgical History:  Procedure Laterality Date  . TONSILLECTOMY      Social History   Socioeconomic History  . Marital status: Married    Spouse name: Not on file  . Number of children: Not on file  . Years of education: Not on file  . Highest education level: Not on file  Occupational History  . Occupation: homemaker  Tobacco Use  . Smoking status: Never Smoker  . Smokeless tobacco: Never Used  Vaping Use  . Vaping Use: Never used  Substance and Sexual Activity  . Alcohol use: No  . Drug use: No  . Sexual activity: Yes    Partners: Male    Birth control/protection: I.U.D.  Other Topics Concern  . Not on file  Social History Narrative  . Not on file   Social Determinants of Health   Financial Resource Strain:   . Difficulty of Paying Living Expenses:   Food Insecurity:    . Worried About Charity fundraiser in the Last Year:   . Arboriculturist in the Last Year:   Transportation Needs:   . Film/video editor (Medical):   Marland Kitchen Lack of Transportation (Non-Medical):   Physical Activity:   . Days of Exercise per Week:   . Minutes of Exercise per Session:   Stress:   . Feeling of Stress :   Social Connections:   . Frequency of Communication with Friends and Family:   . Frequency of Social Gatherings with Friends and Family:   . Attends Religious Services:   . Active Member of Clubs or Organizations:   . Attends Archivist Meetings:   Marland Kitchen Marital Status:     Family History  Problem Relation Age of Onset  . Stroke Paternal Grandmother     Health Maintenance  Topic Date Due  . Hepatitis C Screening  Never done  . INFLUENZA VACCINE  10/05/2019  . MAMMOGRAM  02/27/2020  . TETANUS/TDAP  04/10/2022  . PAP SMEAR-Modifier  05/27/2022  . COVID-19 Vaccine  Completed  . HIV Screening  Completed     ----------------------------------------------------------------------------------------------------------------------------------------------------------------------------------------------------------------- Physical Exam BP 139/87 (BP Location: Left Arm, Patient Position: Sitting, Cuff  Size: Large)   Pulse 80   Temp 98.6 F (37 C) (Oral)   Wt 215 lb 1.9 oz (97.6 kg)   BMI 36.73 kg/m   Physical Exam Constitutional:      Appearance: Normal appearance.  HENT:     Head: Normocephalic and atraumatic.  Musculoskeletal:     Cervical back: Neck supple.  Neurological:     General: No focal deficit present.     Mental Status: She is alert.  Psychiatric:        Mood and Affect: Mood normal.        Behavior: Behavior normal.      ------------------------------------------------------------------------------------------------------------------------------------------------------------------------------------------------------------------- Assessment and Plan  Right hip pain Improved some since last visit Continue diclofenac as needed, renewed.  Will add formal PT.  Recommend visit with Dr. Darene Lamer if this continues to not improve.   Hand pain Improves with diclofenac.  Can consider auto-immune work up if this persists.     Meds ordered this encounter  Medications  . diclofenac (VOLTAREN) 75 MG EC tablet    Sig: Take 1 tablet (75 mg total) by mouth 2 (two) times daily as needed.    Dispense:  60 tablet    Refill:  1    No follow-ups on file.    This visit occurred during the SARS-CoV-2 public health emergency.  Safety protocols were in place, including screening questions prior to the visit, additional usage of staff PPE, and extensive cleaning of exam room while observing appropriate contact time as indicated for disinfecting solutions.

## 2019-10-23 DIAGNOSIS — J301 Allergic rhinitis due to pollen: Secondary | ICD-10-CM | POA: Diagnosis not present

## 2019-10-24 LAB — QUANTIFERON-TB GOLD PLUS
Mitogen-NIL: 9.59 IU/mL
NIL: 0.03 IU/mL
QuantiFERON-TB Gold Plus: NEGATIVE
TB1-NIL: 0.01 IU/mL
TB2-NIL: 0 IU/mL

## 2019-10-27 DIAGNOSIS — J301 Allergic rhinitis due to pollen: Secondary | ICD-10-CM | POA: Diagnosis not present

## 2019-10-28 ENCOUNTER — Other Ambulatory Visit: Payer: Self-pay

## 2019-10-28 ENCOUNTER — Encounter: Payer: Self-pay | Admitting: Family Medicine

## 2019-10-28 ENCOUNTER — Encounter: Payer: Self-pay | Admitting: Rehabilitative and Restorative Service Providers"

## 2019-10-28 ENCOUNTER — Ambulatory Visit (INDEPENDENT_AMBULATORY_CARE_PROVIDER_SITE_OTHER): Payer: BC Managed Care – PPO | Admitting: Rehabilitative and Restorative Service Providers"

## 2019-10-28 ENCOUNTER — Other Ambulatory Visit: Payer: Self-pay | Admitting: Family Medicine

## 2019-10-28 DIAGNOSIS — M25551 Pain in right hip: Secondary | ICD-10-CM | POA: Diagnosis not present

## 2019-10-28 DIAGNOSIS — R2689 Other abnormalities of gait and mobility: Secondary | ICD-10-CM | POA: Diagnosis not present

## 2019-10-28 DIAGNOSIS — R293 Abnormal posture: Secondary | ICD-10-CM

## 2019-10-28 DIAGNOSIS — R29898 Other symptoms and signs involving the musculoskeletal system: Secondary | ICD-10-CM

## 2019-10-28 MED ORDER — CYCLOBENZAPRINE HCL 10 MG PO TABS
10.0000 mg | ORAL_TABLET | Freq: Three times a day (TID) | ORAL | 0 refills | Status: DC | PRN
Start: 2019-10-28 — End: 2019-12-03

## 2019-10-28 NOTE — Patient Instructions (Addendum)
  Access Code: [email or web address]: [email or web address]: 08/24/2021Prepared by: Desirai Traxler HoltExercises  Prone Press Up - 2 x daily - 7 x weekly - 1 sets - 10 reps - 2-3 sec hold Patient Education  TENS Unit  Trigger Point Dry Needling

## 2019-10-28 NOTE — Therapy (Signed)
[place], [place], [address] Phone: [phone]   Fax:  [phone]  Physical Therapy Evaluation  Patient Details  Name: Elaine Murillo MRN: [id number] Date of Birth: Dec 20, 1970 Referring Provider (PT): Dr Luetta Nutting   Encounter Date: 10/28/2019   PT End of Session - 10/28/19 1258    Visit Number 1    Number of Visits 12    Date for PT Re-Evaluation 12/09/19    PT Start Time 0848    PT Stop Time 0942    PT Time Calculation (min) 54 min    Activity Tolerance Patient tolerated treatment well           Past Medical History:  Diagnosis Date  . Anxiety   . Uterine fibroids affecting pregnancy     Past Surgical History:  Procedure Laterality Date  . TONSILLECTOMY      There were no vitals filed for this visit.    Subjective Assessment - 10/28/19 0855    Subjective Patient reports that she has been having pain in the Rt hip since May. She was working on an exercise program when she strained muscles in the hip. She has pain in the whole hip. She has been taking antiinflammatory medication with some improvement. [facility].    Pertinent History bunions; pronated feet bilat - followed by poditrist - 2 vaginal deliveries uncomplicated    Currently in Pain? Yes    Pain Score 8     Pain Location Hip    Pain Orientation Right    Pain Descriptors / Indicators Sharp;Burning    Pain Type Acute pain    Pain Radiating Towards lateral knee sometimes tightness in the quad    Pain Onset More than a month ago    Pain Frequency Intermittent    Aggravating Factors  walking; swimming - kicking; resistive biking; squatting; bending lying on Rt side    Pain Relieving Factors meds              [facility] PT Assessment - 10/28/19 0001      Assessment   Medical Diagnosis Rt posterior lateral hip pain     Referring Provider (PT) Dr Luetta Nutting    Onset  Date/Surgical Date 07/16/19    Hand Dominance Right    Next MD Visit Dr T - 11/03/19    Prior Therapy none       Balance Screen   Has the patient fallen in the past 6 months No    Has the patient had a decrease in activity level because of a fear of falling?  No    Is the patient reluctant to leave their home because of a fear of falling?  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     Leisure exercise at [employer] - cycling; yoga; water exercises; weights in the past       Observation/Other Assessments   Focus on Therapeutic Outcomes (FOTO)  67% limitation       Sensation   Additional Comments WFL's per pt report       Posture/Postural Control   Posture Comments head forward; shoulders rounded; flexed forward at hips; wt shifted to the RT       AROM   AROM Assessment Site --  tight hip extension bilat Rt > Lt    Right/Left Hip --   tight end ranges throughout Rt hip    Lumbar Flexion 80% tightness pain Rt hip     Lumbar Extension 30% decreased pain     Lumbar - Right Side Bend 70%     Lumbar - Left Side Bend 70%     Lumbar - Right Rotation 50%     Lumbar - Left Rotation 50%      Strength   Overall Strength Comments functional strength through bilat LE's not tested resistively due to pain and limited motion       Flexibility   Hamstrings Rt 65 deg; Lt 70 deg     Quadriceps tight bilat end range     ITB tight Rt > Lt     Piriformis tight Rt > Lt       Palpation   Spinal mobility WFL's lumbar CPA mobs     Palpation comment significant muscular tightness through Rt iliopsoas; hip adductors; TFL/ITB; posterior hip through the gluts and piriformis       Special Tests   Other special tests (-) SLR       Transfers   Comments difficulty with transfers and transitiional movements with Rt hip pain and limited movement       Ambulation/Gait   Gait Comments antalgic gait  pattern with decreased wt bearing Rt LE in Rt stance; trunk and hips flexed; Rt hip and knee flexed and externally rotated       Balance   Balance Assessed --   SLS~10 sec bilat Rt with 1 touck down for balance/difficulty                     Objective measurements completed on examination: See above findings.       [facility] Adult PT Treatment/Exercise - 10/28/19 0001      Therapeutic Activites    Therapeutic Activities --   myofacial ball release work anterior/posterior hip standing      Lumbar Exercises: Stretches   Press Ups 5 reps   3-5 sec hold    Piriformis Stretch Right;2 reps;20 seconds   seated      Cryotherapy   Number Minutes Cryotherapy 10 Minutes    Cryotherapy Location Hip   anterior hip    Type of Cryotherapy Ice pack      Electrical Stimulation   Electrical Stimulation Location Rt anterior hip to adductors; Rt posterior hip/gluts     Electrical Stimulation Action TENS    Electrical Stimulation Parameters to tolerance    Electrical Stimulation Goals Pain;Tone                  PT Education - 10/28/19 0929    Education Details HEP POC DN TENS    Person(s) Educated Patient    Methods Explanation;Demonstration;Tactile cues;Verbal cues;Handout    Comprehension Verbalized understanding;Returned demonstration;Verbal cues required;Tactile cues required               PT Long Term Goals - 10/28/19 1306      PT LONG TERM GOAL #1   Title Improve posture and alignment with patient to demonstrate upright posture with equal wt bearing with no pain    Time 6    Period Weeks    Status New    Target Date 12/09/19      PT LONG TERM GOAL #2   Title AROM through the trunk and bilat hips WFL's with no pain or  limitations    Time 6    Period Weeks    Status New    Target Date 12/09/19      PT LONG TERM GOAL #3   Title normal gait pattern for functional household distances with no pain    Time 6    Period Weeks    Status New    Target Date  12/09/19      PT LONG TERM GOAL #4   Title independent in HEP    Time 6    Period Weeks    Status New    Target Date 12/09/19      PT LONG TERM GOAL #5   Title improve FOTO To </= 37% limitation    Time 6    Period Weeks    Status New    Target Date 12/09/19                  Plan - 10/28/19 1258    Clinical Impression Statement Patient presents with 3-4 month history of Rt hip pain with no known injury although patient suspects irritation of Rt hip during workout class with leg lifts. She has had intermittent symptoms since that time with variable intensity and frequency. Symptoms have increased in the past few weeks. Patient has poor posture and alignment; antalgic transfers/transitional movements/gait; limited ROM trunk and Rt > Lt LE's; significant muscular tightness through the Rt lower quarter; limited functional activity tolerance due to pain and limitations. patient will benefit from PT to address problems identified.    Stability/Clinical Decision Making Evolving/Moderate complexity    Clinical Decision Making Low    Rehab Potential Good    PT Frequency 2x / week    PT Duration 6 weeks    PT Treatment/Interventions Patient/family education;ADLs/Self Care Home Management;Aquatic Therapy;Cryotherapy;Electrical Stimulation;Iontophoresis 4mg /ml Dexamethasone;Moist Heat;Ultrasound;Gait training;Stair training;Functional mobility training;Therapeutic activities;Therapeutic exercise;Balance training;Neuromuscular re-education;Manual techniques;Dry needling;Taping    PT Next Visit Plan review HEP; continue with back care education; trial of DN vs manual work Rt iliacus/adductors/posterior hip- glut med/piriformis; stretching and core stabilization as tolerated; check on TENS unit for home    PT Home Exercise Plan [facility]    Consulted and Agree with Plan of Care Patient           Patient will benefit from skilled therapeutic intervention in order to improve the following  deficits and impairments:  Abnormal gait, Difficulty walking, Increased fascial restricitons, Decreased endurance, Decreased activity tolerance, Pain, Impaired flexibility, Improper body mechanics, Decreased mobility, Decreased strength, Postural dysfunction  Visit Diagnosis: Pain in right hip - Plan: PT plan of care cert/re-cert  Other symptoms and signs involving the musculoskeletal system - Plan: PT plan of care cert/re-cert  Abnormal posture - Plan: PT plan of care cert/re-cert  Other abnormalities of gait and mobility - Plan: PT plan of care cert/re-cert     Problem List Patient Active Problem List   Diagnosis Date Noted  . Right hip pain 09/04/2019  . Hand pain 09/04/2019  . IUD (intrauterine device) in place 05/27/2019  . Facial flushing 12/08/2018  . Abnormal RBC 12/08/2018  . ETD (Eustachian tube dysfunction), right 12/03/2018  . Large breasts 01/18/2018  . Dry skin 12/29/2016  . Itching 12/29/2016  . Elevated blood pressure reading 06/06/2016  . Allergic rhinitis with postnasal drip 06/01/2016  . Migraine variant 11/16/2015  . Hallux valgus of left foot 02/08/2015  . Metatarsalgia of left foot 02/08/2015  . Class 2 obesity due to excess calories without serious comorbidity with body  mass index (BMI) of 36.0 to 36.9 in adult 02/08/2015  . Right-sided low back pain with right-sided sciatica 02/16/2014  . Vitamin D insufficiency 02/03/2014  . Need for prophylactic vaccination and inoculation against influenza 11/21/2012  . WEIGHT GAIN 08/20/2008    Elaine Murillo Nilda Simmer PT, MPH  10/28/2019, 1:11 PM  [facility] [place], [place], [address] Phone: [phone]   Fax:  [phone]  Name: Elaine Murillo MRN: [id number] Date of Birth: Jan 18, 1971

## 2019-10-30 ENCOUNTER — Other Ambulatory Visit: Payer: Self-pay

## 2019-10-30 ENCOUNTER — Encounter: Payer: Self-pay | Admitting: Rehabilitative and Restorative Service Providers"

## 2019-10-30 ENCOUNTER — Ambulatory Visit (INDEPENDENT_AMBULATORY_CARE_PROVIDER_SITE_OTHER): Payer: BC Managed Care – PPO | Admitting: Rehabilitative and Restorative Service Providers"

## 2019-10-30 DIAGNOSIS — M25551 Pain in right hip: Secondary | ICD-10-CM | POA: Diagnosis not present

## 2019-10-30 DIAGNOSIS — J301 Allergic rhinitis due to pollen: Secondary | ICD-10-CM | POA: Diagnosis not present

## 2019-10-30 DIAGNOSIS — R2689 Other abnormalities of gait and mobility: Secondary | ICD-10-CM | POA: Diagnosis not present

## 2019-10-30 DIAGNOSIS — R29898 Other symptoms and signs involving the musculoskeletal system: Secondary | ICD-10-CM

## 2019-10-30 DIAGNOSIS — R293 Abnormal posture: Secondary | ICD-10-CM

## 2019-10-30 NOTE — Therapy (Signed)
[place], [place], [address] Phone: [phone]   Fax:  [phone]  Physical Therapy Treatment  Patient Details  Name: Elaine Murillo MRN: [id number] Date of Birth: Nov 27, 1970 Referring Provider (PT): Dr Luetta Nutting   Encounter Date: 10/30/2019   PT End of Session - 10/30/19 1056    Visit Number 2    Number of Visits 12    Date for PT Re-Evaluation 12/09/19    PT Start Time 1055    PT Stop Time 1145    PT Time Calculation (min) 50 min    Activity Tolerance Patient tolerated treatment well           Past Medical History:  Diagnosis Date   Anxiety    Uterine fibroids affecting pregnancy     Past Surgical History:  Procedure Laterality Date   TONSILLECTOMY      There were no vitals filed for this visit.   Subjective Assessment - 10/30/19 1059    Subjective Patient reports that she is workiong on exercises at home and can see some improvement. Still hurts when she gets out of the car or gets up from sitting. Started the muscle relaxant with some improvement.    Currently in Pain? Yes    Pain Score 3     Pain Location Hip    Pain Orientation Right    Pain Descriptors / Indicators Sharp;Burning    Pain Type Acute pain    Pain Onset More than a month ago    Pain Frequency Intermittent                             [facility] Adult PT Treatment/Exercise - 10/30/19 0001      Lumbar Exercises: Stretches   Hip Flexor Stretch Right;Left;2 reps;30 seconds   seated    Standing Extension 3 reps;5 seconds    Press Ups 5 reps   3-5 sec hold - pause and exhale at end range    Quad Stretch Right;2 reps;30 seconds   prone with strap    Piriformis Stretch Limitations painful - hold     Other Lumbar Stretch Exercise hip extension in doorway 5 sec x 3 reps       Lumbar Exercises: Standing   Other Standing Lumbar Exercises hip extension at wall 10 reps each LE       Moist Heat  Therapy   Number Minutes Moist Heat 10 Minutes    Moist Heat Location Hip   ant/post Rt     Electrical Stimulation   Electrical Stimulation Location Rt anterior hip to adductors; Rt posterior hip/gluts     Electrical Stimulation Action IFC    Electrical Stimulation Parameters to tolerance    Electrical Stimulation Goals Pain;Tone      Manual Therapy   Manual therapy comments skilled palpation to assess tissue response to DN and manual work     Joint Mobilization PA mobs Rt hip     Soft tissue mobilization deep tissue work through the posterior and anterior Rt hip     Myofascial Release posterior Rt hip             Trigger Point Dry Needling - 10/30/19 0001    Consent Given? Yes    Education Handout Provided Yes    Dry Needling Comments Rt    Gluteus Maximus Response Palpable increased muscle length    Piriformis Response Palpable increased muscle length;Twitch response  elicited                PT Education - 10/30/19 1150    Education Details HEP    Person(s) Educated Patient    Methods Explanation;Demonstration;Tactile cues;Verbal cues;Handout    Comprehension Verbalized understanding;Returned demonstration;Verbal cues required;Tactile cues required               PT Long Term Goals - 10/28/19 1306      PT LONG TERM GOAL #1   Title Improve posture and alignment with patient to demonstrate upright posture with equal wt bearing with no pain    Time 6    Period Weeks    Status New    Target Date 12/09/19      PT LONG TERM GOAL #2   Title AROM through the trunk and bilat hips WFL's with no pain or limitations    Time 6    Period Weeks    Status New    Target Date 12/09/19      PT LONG TERM GOAL #3   Title normal gait pattern for functional household distances with no pain    Time 6    Period Weeks    Status New    Target Date 12/09/19      PT LONG TERM GOAL #4   Title independent in HEP    Time 6    Period Weeks    Status New    Target Date  12/09/19      PT LONG TERM GOAL #5   Title improve FOTO To </= 37% limitation    Time 6    Period Weeks    Status New    Target Date 12/09/19                 Plan - 10/30/19 1108    Clinical Impression Statement Positive response to initial treatment and medication. Continued muscular tightness Rt posterior hip and especially anteriorly at hip flexors. Added new exercises and trial of DN. Patient tolerated three needles in the posterior hip. She does demonstrate decresed tightness to palpation and improved mobility at end of treatment.    Rehab Potential Good    PT Frequency 2x / week    PT Duration 6 weeks    PT Treatment/Interventions Patient/family education;ADLs/Self Care Home Management;Aquatic Therapy;Cryotherapy;Electrical Stimulation;Iontophoresis 4mg /ml Dexamethasone;Moist Heat;Ultrasound;Gait training;Stair training;Functional mobility training;Therapeutic activities;Therapeutic exercise;Balance training;Neuromuscular re-education;Manual techniques;Dry needling;Taping    PT Next Visit Plan review HEP; continue with back care education; assess response to trial of DN vs manual work Rt iliacus/adductors/posterior hip- glut med/piriformis; stretching and core stabilization as tolerated; TENS unit ordered for home    PT Home Exercise Plan [facility]    Consulted and Agree with Plan of Care Patient           Patient will benefit from skilled therapeutic intervention in order to improve the following deficits and impairments:     Visit Diagnosis: Pain in right hip  Other symptoms and signs involving the musculoskeletal system  Abnormal posture  Other abnormalities of gait and mobility     Problem List Patient Active Problem List   Diagnosis Date Noted   Right hip pain 09/04/2019   Hand pain 09/04/2019   IUD (intrauterine device) in place 05/27/2019   Facial flushing 12/08/2018   Abnormal RBC 12/08/2018   ETD (Eustachian tube dysfunction), right 12/03/2018    Large breasts 01/18/2018   Dry skin 12/29/2016   Itching 12/29/2016   Elevated blood pressure reading 06/06/2016  Allergic rhinitis with postnasal drip 06/01/2016   Migraine variant 11/16/2015   Hallux valgus of left foot 02/08/2015   Metatarsalgia of left foot 02/08/2015   Class 2 obesity due to excess calories without serious comorbidity with body mass index (BMI) of 36.0 to 36.9 in adult 02/08/2015   Right-sided low back pain with right-sided sciatica 02/16/2014   Vitamin D insufficiency 02/03/2014   Need for prophylactic vaccination and inoculation against influenza 11/21/2012   WEIGHT GAIN 08/20/2008    Mahir Prabhakar Nilda Simmer PT, MPH  10/30/2019, 12:43 PM  [facility] [place], [place], [address] Phone: [phone]   Fax:  [phone]  Name: Elaine Murillo MRN: [id number] Date of Birth: 06/02/1970

## 2019-10-30 NOTE — Patient Instructions (Addendum)
Access Code: [email or web address]: [email or web address]: 08/26/2021Prepared by: Season Astacio HoltExercises  Prone Press Up - 2 x daily - 7 x weekly - 1 sets - 10 reps - 2-3 sec hold  Prone Quadriceps Stretch with Strap - 2 x daily - 7 x weekly - 1 sets - 3 reps - 30 sec hold  Standing Hip Extension with Counter Support - 2 x daily - 7 x weekly - 1-2 sets - 10 reps - 3-5 sec hold  Trigger Point Dry Needling   What is Trigger Point Dry Needling (DN)? o DN is a physical therapy technique used to treat muscle pain and dysfunction. Specifically, DN helps deactivate muscle trigger points (muscle knots).  o A thin filiform needle is used to penetrate the skin and stimulate the underlying trigger point. The goal is for a local twitch response (LTR) to occur and for the trigger point to relax. No medication of any kind is injected during the procedure.    What Does Trigger Point Dry Needling Feel Like?  o The procedure feels different for each individual patient. Some patients report that they do not actually feel the needle enter the skin and overall the process is not painful. Very mild bleeding may occur. However, many patients feel a deep cramping in the muscle in which the needle was inserted. This is the local twitch response.    How Will I feel after the treatment? o Soreness is normal, and the onset of soreness may not occur for a few hours. Typically this soreness does not last longer than two days.  o Bruising is uncommon, however; ice can be used to decrease any possible bruising.  o In rare cases feeling tired or nauseous after the treatment is normal. In addition, your symptoms may get worse before they get better, this period will typically not last longer than 24 hours.    What Can I do After My Treatment? o Increase your hydration by drinking more water for the next 24 hours. o You may place ice or heat on the areas treated that have become sore, however, do not use heat on  inflamed or bruised areas. Heat often brings more relief post needling. o You can continue your regular activities, but vigorous activity is not recommended initially after the treatment for 24 hours. o DN is best combined with other physical therapy such as strengthening, stretching, and other therapies.

## 2019-11-03 ENCOUNTER — Ambulatory Visit: Payer: BC Managed Care – PPO | Admitting: Sports Medicine

## 2019-11-03 ENCOUNTER — Encounter: Payer: Self-pay | Admitting: Rehabilitative and Restorative Service Providers"

## 2019-11-03 ENCOUNTER — Ambulatory Visit (INDEPENDENT_AMBULATORY_CARE_PROVIDER_SITE_OTHER): Payer: BC Managed Care – PPO | Admitting: Rehabilitative and Restorative Service Providers"

## 2019-11-03 ENCOUNTER — Other Ambulatory Visit: Payer: Self-pay

## 2019-11-03 DIAGNOSIS — J301 Allergic rhinitis due to pollen: Secondary | ICD-10-CM | POA: Diagnosis not present

## 2019-11-03 DIAGNOSIS — R29898 Other symptoms and signs involving the musculoskeletal system: Secondary | ICD-10-CM

## 2019-11-03 DIAGNOSIS — R2689 Other abnormalities of gait and mobility: Secondary | ICD-10-CM | POA: Diagnosis not present

## 2019-11-03 DIAGNOSIS — R293 Abnormal posture: Secondary | ICD-10-CM | POA: Diagnosis not present

## 2019-11-03 DIAGNOSIS — M25551 Pain in right hip: Secondary | ICD-10-CM

## 2019-11-03 NOTE — Patient Instructions (Signed)
Access Code: [email or web address]: [email or web address]: 08/30/2021Prepared by: Jakaleb Payer HoltExercises  Prone Press Up - 2 x daily - 7 x weekly - 1 sets - 10 reps - 2-3 sec hold  Prone Quadriceps Stretch with Strap - 2 x daily - 7 x weekly - 1 sets - 3 reps - 30 sec hold  Standing Hip Extension with Counter Support - 2 x daily - 7 x weekly - 1-2 sets - 10 reps - 3-5 sec hold  Standing Hip Abduction with Counter Support - 2 x daily - 7 x weekly - 2-3 sets - 10 reps - 2-3 sec hold Stretching arm overhead in hip flexed sitting position  Hip flexor stretch one LE on table other foot on floor

## 2019-11-03 NOTE — Therapy (Signed)
[place], [place], [address] Phone: [phone]   Fax:  [phone]  Physical Therapy Treatment  Patient Details  Name: Elaine Murillo MRN: [id number] Date of Birth: 1971/01/09 Referring Provider (PT): Dr Luetta Nutting   Encounter Date: 11/03/2019   PT End of Session - 11/03/19 1150    Visit Number 3    Number of Visits 12    Date for PT Re-Evaluation 12/09/19    PT Start Time 2202    PT Stop Time 1234    PT Time Calculation (min) 49 min    Activity Tolerance Patient tolerated treatment well           Past Medical History:  Diagnosis Date  . Anxiety   . Uterine fibroids affecting pregnancy     Past Surgical History:  Procedure Laterality Date  . TONSILLECTOMY      There were no vitals filed for this visit.   Subjective Assessment - 11/03/19 1150    Subjective Patient reports that she walked in the park Friday and noted increased pain and tightness. Did nothing over the weekend and was some better this am until she went to the grocery store. Got a cramp in the quad in the grocery store which was worse when she was driving. Thinks the DN helped but may need to try it in the front.    Currently in Pain? Yes    Pain Score 4     Pain Location Hip    Pain Orientation Right    Pain Descriptors / Indicators Sharp;Stabbing    Pain Type Acute pain    Pain Onset More than a month ago    Pain Frequency Intermittent                             [facility] Adult PT Treatment/Exercise - 11/03/19 0001      Lumbar Exercises: Stretches   Hip Flexor Stretch Right;Left;2 reps;30 seconds   seated reaching up overhead  with same side arm    Hip Flexor Stretch Limitations single leg hip flexor stretch Rt LE on table Lt foot on floor pressing trunk up 20-30 sec hold x 3 reps Rt; 2 Lt     Standing Extension 3 reps;5 seconds    Press Ups 5 reps   3-5 sec hold - pause and exhale at end range     Sports administrator Right;2 reps;30 seconds   prone with strap last reps with foam roll distal thigh    Other Lumbar Stretch Exercise hip extension in doorway 5 sec x 3 reps       Knee/Hip Exercises: Standing   Hip Flexion --    Hip Abduction AROM;Stengthening;Right;Left;10 reps;Knee straight   leading out with heel    Hip Extension AROM;Stengthening;Right;Left;2 sets;10 reps;Knee straight      Moist Heat Therapy   Number Minutes Moist Heat 10 Minutes    Moist Heat Location Hip   ant/post Rt     Manual Therapy   Manual therapy comments skilled palpation to assess tissue response to DN and manual work     Soft tissue mobilization deep tissue work through anterior Rt hip and quad     Myofascial Release Rt quad             Trigger Point Dry Needling - 11/03/19 0001    Consent Given? Yes    Education Handout Provided Previously provided  Dry Needling Comments Rt    Quadriceps Response Palpable increased muscle length    Rectus femoris Response Palpable increased muscle length;Twitch response elicited    Vastus medialis Response Palpable increased muscle length    Iliacus Response Palpable increased muscle length                PT Education - 11/03/19 1237    Education Details HEP    Person(s) Educated Patient    Methods Explanation;Demonstration;Tactile cues;Verbal cues;Handout    Comprehension Verbalized understanding;Returned demonstration;Verbal cues required;Tactile cues required               PT Long Term Goals - 10/28/19 1306      PT LONG TERM GOAL #1   Title Improve posture and alignment with patient to demonstrate upright posture with equal wt bearing with no pain    Time 6    Period Weeks    Status New    Target Date 12/09/19      PT LONG TERM GOAL #2   Title AROM through the trunk and bilat hips WFL's with no pain or limitations    Time 6    Period Weeks    Status New    Target Date 12/09/19      PT LONG TERM GOAL #3   Title normal gait pattern for  functional household distances with no pain    Time 6    Period Weeks    Status New    Target Date 12/09/19      PT LONG TERM GOAL #4   Title independent in HEP    Time 6    Period Weeks    Status New    Target Date 12/09/19      PT LONG TERM GOAL #5   Title improve FOTO To </= 37% limitation    Time 6    Period Weeks    Status New    Target Date 12/09/19                 Plan - 11/03/19 1155    Clinical Impression Statement Up and down depending on activity level and stretching. Positive response to DN and manual work with improved upright posture and alignment and improved gait pattern following treatment.    Rehab Potential Good    PT Frequency 2x / week    PT Duration 6 weeks    PT Treatment/Interventions Patient/family education;ADLs/Self Care Home Management;Aquatic Therapy;Cryotherapy;Electrical Stimulation;Iontophoresis 4mg /ml Dexamethasone;Moist Heat;Ultrasound;Gait training;Stair training;Functional mobility training;Therapeutic activities;Therapeutic exercise;Balance training;Neuromuscular re-education;Manual techniques;Dry needling;Taping    PT Next Visit Plan review HEP; continue with back care education; continue trial of DN vs manual work Rt iliacus/adductors/posterior hip- glut med/piriformis; stretching and core stabilization as tolerated; TENS unit helps some for home    PT Home Exercise Plan [facility]    Consulted and Agree with Plan of Care Patient           Patient will benefit from skilled therapeutic intervention in order to improve the following deficits and impairments:     Visit Diagnosis: Pain in right hip  Other symptoms and signs involving the musculoskeletal system  Abnormal posture  Other abnormalities of gait and mobility     Problem List Patient Active Problem List   Diagnosis Date Noted  . Right hip pain 09/04/2019  . Hand pain 09/04/2019  . IUD (intrauterine device) in place 05/27/2019  . Facial flushing 12/08/2018  .  Abnormal RBC 12/08/2018  . ETD (Eustachian tube dysfunction), right 12/03/2018  .  Large breasts 01/18/2018  . Dry skin 12/29/2016  . Itching 12/29/2016  . Elevated blood pressure reading 06/06/2016  . Allergic rhinitis with postnasal drip 06/01/2016  . Migraine variant 11/16/2015  . Hallux valgus of left foot 02/08/2015  . Metatarsalgia of left foot 02/08/2015  . Class 2 obesity due to excess calories without serious comorbidity with body mass index (BMI) of 36.0 to 36.9 in adult 02/08/2015  . Right-sided low back pain with right-sided sciatica 02/16/2014  . Vitamin D insufficiency 02/03/2014  . Need for prophylactic vaccination and inoculation against influenza 11/21/2012  . WEIGHT GAIN 08/20/2008    Tishawna Larouche Nilda Simmer PT, MPH  11/03/2019, 1:12 PM  [facility] [place], [place], [address] Phone: [phone]   Fax:  [phone]  Name: Saydee Zolman MRN: [id number] Date of Birth: 1970-11-25

## 2019-11-06 DIAGNOSIS — J301 Allergic rhinitis due to pollen: Secondary | ICD-10-CM | POA: Diagnosis not present

## 2019-11-07 ENCOUNTER — Ambulatory Visit (INDEPENDENT_AMBULATORY_CARE_PROVIDER_SITE_OTHER): Payer: BC Managed Care – PPO | Admitting: Rehabilitative and Restorative Service Providers"

## 2019-11-07 ENCOUNTER — Other Ambulatory Visit: Payer: Self-pay

## 2019-11-07 ENCOUNTER — Encounter: Payer: Self-pay | Admitting: Rehabilitative and Restorative Service Providers"

## 2019-11-07 DIAGNOSIS — R293 Abnormal posture: Secondary | ICD-10-CM | POA: Diagnosis not present

## 2019-11-07 DIAGNOSIS — R2689 Other abnormalities of gait and mobility: Secondary | ICD-10-CM

## 2019-11-07 DIAGNOSIS — M25551 Pain in right hip: Secondary | ICD-10-CM | POA: Diagnosis not present

## 2019-11-07 DIAGNOSIS — R29898 Other symptoms and signs involving the musculoskeletal system: Secondary | ICD-10-CM

## 2019-11-07 NOTE — Therapy (Signed)
[place], [place], [address] Phone: [phone]   Fax:  [phone]  Physical Therapy Treatment  Patient Details  Name: Elaine Murillo MRN: [id number] Date of Birth: Jan 16, 1971 Referring Provider (PT): Dr Luetta Nutting   Encounter Date: 11/07/2019   PT End of Session - 11/07/19 0851    Visit Number 4    Number of Visits 12    Date for PT Re-Evaluation 12/09/19    PT Start Time 0845    PT Stop Time 0936    PT Time Calculation (min) 51 min    Activity Tolerance Patient tolerated treatment well           Past Medical History:  Diagnosis Date  . Anxiety   . Uterine fibroids affecting pregnancy     Past Surgical History:  Procedure Laterality Date  . TONSILLECTOMY      There were no vitals filed for this visit.   Subjective Assessment - 11/07/19 0852    Subjective Taking medication typically twice a day but then tries to decrease or stop the medication and then flares up. Did well after last treatment but [facility] and had [place] pain and tightness.    Currently in Pain? Yes    Pain Score 2     Pain Location Hip    Pain Orientation Right    Pain Descriptors / Indicators Tightness;Sharp    Pain Type Acute pain              [facility] PT Assessment - 11/07/19 0001      Assessment   Medical Diagnosis Rt posterior lateral hip pain     Referring Provider (PT) Dr Luetta Nutting    Onset Date/Surgical Date 07/16/19    Hand Dominance Right    Next MD Visit Dr T - 11/03/19    Prior Therapy none       Flexibility   Hamstrings Rt 70 deg; Lt 70 deg     Quadriceps tight bilat end range ROM increasing      ITB tight Rt > Lt     Piriformis tight Rt > Lt       Palpation   Palpation comment muscular tightness through Rt iliopsoas; hip adductors; TFL/ITB; posterior hip through the gluts and piriformis       Ambulation/Gait   Gait Comments improving gait pattern - first few steps  continue to be antalgic with limp on Rt LE with wt bearing Rt then gait pattern improves                          [facility] Adult PT Treatment/Exercise - 11/07/19 0001      Therapeutic Activites    Therapeutic Activities --   myofacial ball release anterior hip pt prone      Lumbar Exercises: Stretches   Hip Flexor Stretch Right;Left;2 reps;30 seconds   seated reaching up overhead  with same side arm    Hip Flexor Stretch Limitations single leg hip flexor stretch Rt LE on table Lt foot on floor pressing trunk up 20-30 sec hold x 3 reps Rt; 2 Lt     Standing Extension 3 reps;5 seconds    Press Ups 5 reps   3-5 sec hold - pause and exhale at end range    Quad Stretch Right;2 reps;30 seconds   prone with strap last reps with foam roll distal thigh    Other Lumbar  Stretch Exercise standing w/ strap around posterior hips leaning back to mobilize hip 20-30 sec x 4-5 reps     Other Lumbar Stretch Exercise hip extension in doorway 5 sec x 3 reps       Lumbar Exercises: Aerobic   Tread Mill 1 mph x 7 min       Knee/Hip Exercises: Standing   Hip Abduction AROM;Stengthening;Right;Left;10 reps;Knee straight   leading out with heel    Hip Extension AROM;Stengthening;Right;Left;2 sets;10 reps;Knee straight      Moist Heat Therapy   Number Minutes Moist Heat 10 Minutes    Moist Heat Location Hip   ant/post Rt     Manual Therapy   Joint Mobilization PA mobs Rt hip pt prone     Soft tissue mobilization deep tissue work through posterior Rt hip                   PT Education - 11/07/19 0940    Education Details HEP discussed medication use    Person(s) Educated Patient    Methods Explanation;Demonstration;Tactile cues;Verbal cues;Handout    Comprehension Verbalized understanding;Returned demonstration;Verbal cues required;Tactile cues required               PT Long Term Goals - 10/28/19 1306      PT LONG TERM GOAL #1   Title Improve posture and alignment with  patient to demonstrate upright posture with equal wt bearing with no pain    Time 6    Period Weeks    Status New    Target Date 12/09/19      PT LONG TERM GOAL #2   Title AROM through the trunk and bilat hips WFL's with no pain or limitations    Time 6    Period Weeks    Status New    Target Date 12/09/19      PT LONG TERM GOAL #3   Title normal gait pattern for functional household distances with no pain    Time 6    Period Weeks    Status New    Target Date 12/09/19      PT LONG TERM GOAL #4   Title independent in HEP    Time 6    Period Weeks    Status New    Target Date 12/09/19      PT LONG TERM GOAL #5   Title improve FOTO To </= 37% limitation    Time 6    Period Weeks    Status New    Target Date 12/09/19                 Plan - 11/07/19 0859    Clinical Impression Statement Patient is trying to avoid as much prolonged sitting. She is working on her exercises at home. Discussed consistently taking medication for the next 1-2 weeks before decreasing or stopping the medication. Encouraged patient to continue with exercise and ball release work. Positive response to stretching and manual work focus on PA mobs to Rt hip pt prone and deep tissue work posterior Rt hip.    Rehab Potential Good    PT Frequency 2x / week    PT Duration 6 weeks    PT Treatment/Interventions Patient/family education;ADLs/Self Care Home Management;Aquatic Therapy;Cryotherapy;Electrical Stimulation;Iontophoresis 4mg /ml Dexamethasone;Moist Heat;Ultrasound;Gait training;Stair training;Functional mobility training;Therapeutic activities;Therapeutic exercise;Balance training;Neuromuscular re-education;Manual techniques;Dry needling;Taping    PT Next Visit Plan review HEP; continue with back care education; continue trial of DN vs manual work Rt iliacus/adductors/posterior hip- glut  med/piriformis; stretching and core stabilization as tolerated; trial of bridging and wall squat? strengthening  gluts   TENS unit helps home    PT Home Exercise Plan [email or web address]    Consulted and Agree with Plan of Care Patient           Patient will benefit from skilled therapeutic intervention in order to improve the following deficits and impairments:     Visit Diagnosis: Pain in right hip  Other symptoms and signs involving the musculoskeletal system  Abnormal posture  Other abnormalities of gait and mobility     Problem List Patient Active Problem List   Diagnosis Date Noted  . Right hip pain 09/04/2019  . Hand pain 09/04/2019  . IUD (intrauterine device) in place 05/27/2019  . Facial flushing 12/08/2018  . Abnormal RBC 12/08/2018  . ETD (Eustachian tube dysfunction), right 12/03/2018  . Large breasts 01/18/2018  . Dry skin 12/29/2016  . Itching 12/29/2016  . Elevated blood pressure reading 06/06/2016  . Allergic rhinitis with postnasal drip 06/01/2016  . Migraine variant 11/16/2015  . Hallux valgus of left foot 02/08/2015  . Metatarsalgia of left foot 02/08/2015  . Class 2 obesity due to excess calories without serious comorbidity with body mass index (BMI) of 36.0 to 36.9 in adult 02/08/2015  . Right-sided low back pain with right-sided sciatica 02/16/2014  . Vitamin D insufficiency 02/03/2014  . Need for prophylactic vaccination and inoculation against influenza 11/21/2012  . WEIGHT GAIN 08/20/2008    Jesseka Drinkard Nilda Simmer PT, MPH  11/07/2019, 9:41 AM  [facility] [place], [place], [address] Phone: [phone]   Fax:  [phone]  Name: Elaine Murillo MRN: [id number] Date of Birth: 05-21-1970

## 2019-11-07 NOTE — Patient Instructions (Signed)
Take medication consistently for the next two weeks  Ball release work for the front of the Rt hip lying on stomach  Stretch front of hip before bed and first thing when you get up in the morning   Add hip stretch with strap standing at door  20-30 sec x 4-5 reps

## 2019-11-11 ENCOUNTER — Encounter: Payer: Self-pay | Admitting: Rehabilitative and Restorative Service Providers"

## 2019-11-11 ENCOUNTER — Other Ambulatory Visit: Payer: Self-pay

## 2019-11-11 ENCOUNTER — Ambulatory Visit (INDEPENDENT_AMBULATORY_CARE_PROVIDER_SITE_OTHER): Payer: BC Managed Care – PPO | Admitting: Rehabilitative and Restorative Service Providers"

## 2019-11-11 DIAGNOSIS — R293 Abnormal posture: Secondary | ICD-10-CM | POA: Diagnosis not present

## 2019-11-11 DIAGNOSIS — J301 Allergic rhinitis due to pollen: Secondary | ICD-10-CM | POA: Diagnosis not present

## 2019-11-11 DIAGNOSIS — R2689 Other abnormalities of gait and mobility: Secondary | ICD-10-CM

## 2019-11-11 DIAGNOSIS — M25551 Pain in right hip: Secondary | ICD-10-CM | POA: Diagnosis not present

## 2019-11-11 DIAGNOSIS — R29898 Other symptoms and signs involving the musculoskeletal system: Secondary | ICD-10-CM

## 2019-11-11 NOTE — Patient Instructions (Signed)
Access Code: [email or web address]: [email or web address]: 09/07/2021Prepared by: Keigo Whalley HoltExercises  Prone Press Up - 2 x daily - 7 x weekly - 1 sets - 10 reps - 2-3 sec hold  Prone Quadriceps Stretch with Strap - 2 x daily - 7 x weekly - 1 sets - 3 reps - 30 sec hold  Standing Hip Extension with Counter Support - 2 x daily - 7 x weekly - 1-2 sets - 10 reps - 3-5 sec hold  Standing Hip Abduction with Counter Support - 2 x daily - 7 x weekly - 2-3 sets - 10 reps - 2-3 sec hold  Supine Transversus Abdominis Bracing with Pelvic Floor Contraction - 2 x daily - 7 x weekly - 1 sets - 10 reps - 10sec hold  Supine Scapular Protraction in Flexion with Dumbbells - 2 x daily - 7 x weekly - 1 sets - 10 reps - 2-3 sec hold

## 2019-11-11 NOTE — Therapy (Signed)
[place], [place], [address] Phone: [phone]   Fax:  [phone]  Physical Therapy Treatment  Patient Details  Name: Elaine Murillo MRN: [id number] Date of Birth: 08-05-1970 Referring Provider (PT): Dr Luetta Nutting   Encounter Date: 11/11/2019   PT End of Session - 11/11/19 1017    Visit Number 5    Number of Visits 12    Date for PT Re-Evaluation 12/09/19    PT Start Time 0930    PT Stop Time 1023    PT Time Calculation (min) 53 min    Activity Tolerance Patient tolerated treatment well           Past Medical History:  Diagnosis Date  . Anxiety   . Uterine fibroids affecting pregnancy     Past Surgical History:  Procedure Laterality Date  . TONSILLECTOMY      There were no vitals filed for this visit.   Subjective Assessment - 11/11/19 0940    Subjective Taking medication regularily. Stretching and trying to avoid as much sitting. Some better. Still having increase in pain at the end of the day up to 7/10 at times.    Currently in Pain? Yes    Pain Score 2     Pain Location Hip    Pain Orientation Right    Pain Descriptors / Indicators Tightness;Sharp    Pain Type Acute pain                             [facility] Adult PT Treatment/Exercise - 11/11/19 0001      Lumbar Exercises: Stretches   Hip Flexor Stretch Right;Left;2 reps;30 seconds   seated reaching up overhead  with same side arm    Hip Flexor Stretch Limitations thomas stretch with PT assist - with contract relax hip extension     Standing Extension 3 reps;5 seconds    Press Ups 5 reps   3-5 sec hold - pause and exhale at end range    Sports administrator Right;2 reps;30 seconds   prone with strap last reps with foam roll distal thigh    Other Lumbar Stretch Exercise standing w/ strap around posterior hips leaning back to mobilize hip 20-30 sec x 4-5 reps     Other Lumbar Stretch Exercise hip extension in doorway  5 sec x 3 reps       Lumbar Exercises: Aerobic   Tread Mill 1 to 1.8 mph x 5 min       Lumbar Exercises: Supine   AB Set Limitations core contraction pelvic floor and transverse abdominals 10 sec x 10 reps     Other Supine Lumbar Exercises shoulder flexion 90 to 145 deg with core tight       Knee/Hip Exercises: Standing   Hip Abduction AROM;Stengthening;Right;Left;10 reps;Knee straight   leading out with heel    Hip Extension AROM;Stengthening;Right;Left;2 sets;10 reps;Knee straight      Moist Heat Therapy   Number Minutes Moist Heat 10 Minutes    Moist Heat Location Hip   ant/post Rt     Manual Therapy   Manual therapy comments skilled palpation to assess tissue response to DN and manual work     Soft tissue mobilization deep tissue work through the anterior hip  to [employer] Needling - 11/11/19 0001    Consent Given? Yes  Education Handout Provided Previously provided    Dry Needling Comments Rt    Quadriceps Response Palpable increased muscle length    Rectus femoris Response Palpable increased muscle length;Twitch response elicited    Iliacus Response Palpable increased muscle length                PT Education - 11/11/19 1003    Education Details HEP cont education re core stability    Person(s) Educated Patient    Methods Explanation;Demonstration;Tactile cues;Verbal cues;Handout    Comprehension Verbalized understanding;Returned demonstration;Verbal cues required;Tactile cues required               PT Long Term Goals - 10/28/19 1306      PT LONG TERM GOAL #1   Title Improve posture and alignment with patient to demonstrate upright posture with equal wt bearing with no pain    Time 6    Period Weeks    Status New    Target Date 12/09/19      PT LONG TERM GOAL #2   Title AROM through the trunk and bilat hips WFL's with no pain or limitations    Time 6    Period Weeks    Status New    Target Date 12/09/19      PT  LONG TERM GOAL #3   Title normal gait pattern for functional household distances with no pain    Time 6    Period Weeks    Status New    Target Date 12/09/19      PT LONG TERM GOAL #4   Title independent in HEP    Time 6    Period Weeks    Status New    Target Date 12/09/19      PT LONG TERM GOAL #5   Title improve FOTO To </= 37% limitation    Time 6    Period Weeks    Status New    Target Date 12/09/19                 Plan - 11/11/19 0958    Clinical Impression Statement Some improvement in pain during the day with consistent medication and streching and less sitting. Still has increased pain at night. Added core stabilization.    Rehab Potential Good    PT Frequency 2x / week    PT Duration 6 weeks    PT Treatment/Interventions Patient/family education;ADLs/Self Care Home Management;Aquatic Therapy;Cryotherapy;Electrical Stimulation;Iontophoresis 4mg /ml Dexamethasone;Moist Heat;Ultrasound;Gait training;Stair training;Functional mobility training;Therapeutic activities;Therapeutic exercise;Balance training;Neuromuscular re-education;Manual techniques;Dry needling;Taping    PT Next Visit Plan review HEP; continue with back care education; continue trial of DN vs manual work Rt iliacus/adductors/posterior hip- glut med/piriformis; stretching and core stabilization as tolerated; trial of bridging and wall squat? strengthening gluts   TENS unit helps home    PT Home Exercise Plan [email or web address]    Consulted and Agree with Plan of Care Patient           Patient will benefit from skilled therapeutic intervention in order to improve the following deficits and impairments:     Visit Diagnosis: Pain in right hip  Other symptoms and signs involving the musculoskeletal system  Abnormal posture  Other abnormalities of gait and mobility     Problem List Patient Active Problem List   Diagnosis Date Noted  . Right hip pain 09/04/2019  . Hand pain 09/04/2019  . IUD  (intrauterine device) in place 05/27/2019  . Facial flushing 12/08/2018  . Abnormal RBC 12/08/2018  .  ETD (Eustachian tube dysfunction), right 12/03/2018  . Large breasts 01/18/2018  . Dry skin 12/29/2016  . Itching 12/29/2016  . Elevated blood pressure reading 06/06/2016  . Allergic rhinitis with postnasal drip 06/01/2016  . Migraine variant 11/16/2015  . Hallux valgus of left foot 02/08/2015  . Metatarsalgia of left foot 02/08/2015  . Class 2 obesity due to excess calories without serious comorbidity with body mass index (BMI) of 36.0 to 36.9 in adult 02/08/2015  . Right-sided low back pain with right-sided sciatica 02/16/2014  . Vitamin D insufficiency 02/03/2014  . Need for prophylactic vaccination and inoculation against influenza 11/21/2012  . WEIGHT GAIN 08/20/2008    Bertrum Helmstetter Nilda Simmer PT, MPH  11/11/2019, 10:18 AM  [facility] [place], [place], [address] Phone: [phone]   Fax:  [phone]  Name: Liahna Brickner MRN: [id number] Date of Birth: 07/22/1970

## 2019-11-13 ENCOUNTER — Encounter: Payer: Self-pay | Admitting: Rehabilitative and Restorative Service Providers"

## 2019-11-13 ENCOUNTER — Ambulatory Visit (INDEPENDENT_AMBULATORY_CARE_PROVIDER_SITE_OTHER): Payer: BC Managed Care – PPO | Admitting: Rehabilitative and Restorative Service Providers"

## 2019-11-13 ENCOUNTER — Other Ambulatory Visit: Payer: Self-pay

## 2019-11-13 DIAGNOSIS — R293 Abnormal posture: Secondary | ICD-10-CM | POA: Diagnosis not present

## 2019-11-13 DIAGNOSIS — R29898 Other symptoms and signs involving the musculoskeletal system: Secondary | ICD-10-CM | POA: Diagnosis not present

## 2019-11-13 DIAGNOSIS — R2689 Other abnormalities of gait and mobility: Secondary | ICD-10-CM

## 2019-11-13 DIAGNOSIS — J301 Allergic rhinitis due to pollen: Secondary | ICD-10-CM | POA: Diagnosis not present

## 2019-11-13 DIAGNOSIS — M25551 Pain in right hip: Secondary | ICD-10-CM

## 2019-11-13 NOTE — Patient Instructions (Signed)
Access Code: [email or web address]: [email or web address]: 09/09/2021Prepared by: Gwyneth Fernandez HoltExercises  Prone Press Up - 2 x daily - 7 x weekly - 1 sets - 10 reps - 2-3 sec hold  Prone Quadriceps Stretch with Strap - 2 x daily - 7 x weekly - 1 sets - 3 reps - 30 sec hold  Standing Hip Extension with Counter Support - 2 x daily - 7 x weekly - 1-2 sets - 10 reps - 3-5 sec hold  Standing Hip Abduction with Counter Support - 2 x daily - 7 x weekly - 2-3 sets - 10 reps - 2-3 sec hold  Supine Transversus Abdominis Bracing with Pelvic Floor Contraction - 2 x daily - 7 x weekly - 1 sets - 10 reps - 10sec hold  Supine Scapular Protraction in Flexion with Dumbbells - 2 x daily - 7 x weekly - 1 sets - 10 reps - 2-3 sec hold  Warrior I - 2 x daily - 7 x weekly - 1 sets - 10 reps - 3 sec hold  Bridge - 2 x daily - 7 x weekly - 1-2 sets - 10 reps - 5 sec hold  Wall Quarter Squat - 2 x daily - 7 x weekly - 1-2 sets - 10 reps - 5-10 sec hold  Prone Hip Extension - 2 x daily - 7 x weekly - 1 sets - 10 reps - 3 sec hold

## 2019-11-13 NOTE — Therapy (Signed)
[place], [place], [address] Phone: [phone]   Fax:  [phone]  Physical Therapy Treatment  Patient Details  Name: Elaine Murillo MRN: [id number] Date of Birth: 19-Sep-1970 Referring Provider (PT): Dr Luetta Nutting   Encounter Date: 11/13/2019   PT End of Session - 11/13/19 0842    Visit Number 6    Number of Visits 12    Date for PT Re-Evaluation 12/09/19    PT Start Time 0842    PT Stop Time 0930    PT Time Calculation (min) 48 min    Activity Tolerance Patient tolerated treatment well           Past Medical History:  Diagnosis Date   Anxiety    Uterine fibroids affecting pregnancy     Past Surgical History:  Procedure Laterality Date   TONSILLECTOMY      There were no vitals filed for this visit.   Subjective Assessment - 11/13/19 0843    Subjective Walked some yesterday maybe about a mile. Worn out with continued pain and tightness in the hip. Still hurts after she sits for any length of time. did ok with the DN at last visit. Doing better with stopping and stretching during the day.    Currently in Pain? Yes    Pain Score 4     Pain Location Hip    Pain Orientation Right    Pain Descriptors / Indicators Tightness    Pain Type Acute pain              [facility] PT Assessment - 11/13/19 0001      Assessment   Medical Diagnosis Rt posterior lateral hip pain     Referring Provider (PT) Dr Luetta Nutting    Onset Date/Surgical Date 07/16/19    Hand Dominance Right    Next MD Visit Dr T - 11/03/19    Prior Therapy none       Palpation   SI assessment  anterior Rt ASIS/hip; Lt sacral torsion     Palpation comment muscular tightness through Rt iliopsoas; hip adductors; TFL/ITB; posterior hip through the gluts and piriformis                          [facility] Adult PT Treatment/Exercise - 11/13/19 0001      Lumbar Exercises: Stretches   Hip Flexor Stretch  Right;Left;2 reps;30 seconds   seated reaching up overhead  with same side arm    Hip Flexor Stretch Limitations thomas stretch with PT assist - with contract relax hip extension     Standing Extension 3 reps;5 seconds    Press Ups 5 reps   3-5 sec hold - pause and exhale at end range    Sports administrator Right;2 reps;30 seconds   prone with strap last reps with foam roll distal thigh      Lumbar Exercises: Aerobic   Tread Mill 1 - 1.2 mph x 3 min increased hip tightness and pain today       Lumbar Exercises: Standing   Other Standing Lumbar Exercises warrior pose with Rt arm overhead; heel lifts x 10       Manual Therapy   Manual Therapy Muscle Energy Technique    Soft tissue mobilization deep tissue work through the anterior hip/psoas    Muscle Energy Technique MET to correct Lt sacral torsion in prone, attempted 2 variations of MET to correct ant rotated Rt  inominate - limted tolerance.                    PT Education - 11/13/19 0926    Education Details HEP    Person(s) Educated Patient    Methods Explanation;Demonstration;Tactile cues;Verbal cues;Handout    Comprehension Verbalized understanding;Returned demonstration;Verbal cues required;Tactile cues required               PT Long Term Goals - 10/28/19 1306      PT LONG TERM GOAL #1   Title Improve posture and alignment with patient to demonstrate upright posture with equal wt bearing with no pain    Time 6    Period Weeks    Status New    Target Date 12/09/19      PT LONG TERM GOAL #2   Title AROM through the trunk and bilat hips WFL's with no pain or limitations    Time 6    Period Weeks    Status New    Target Date 12/09/19      PT LONG TERM GOAL #3   Title normal gait pattern for functional household distances with no pain    Time 6    Period Weeks    Status New    Target Date 12/09/19      PT LONG TERM GOAL #4   Title independent in HEP    Time 6    Period Weeks    Status New    Target Date  12/09/19      PT LONG TERM GOAL #5   Title improve FOTO To </= 37% limitation    Time 6    Period Weeks    Status New    Target Date 12/09/19                 Plan - 11/13/19 0850    Clinical Impression Statement Persistent pain in the Rt hip - anteriorly and posteriorly. Pain is increased with sitting. Continues to exhibit abnormal gait pattern with ambulation at times. Trial of MET and deep tissue work through psoas. Added stretching anterior hip; activation of gluts.    Rehab Potential Good    PT Frequency 2x / week    PT Duration 6 weeks    PT Treatment/Interventions Patient/family education;ADLs/Self Care Home Management;Aquatic Therapy;Cryotherapy;Electrical Stimulation;Iontophoresis 4mg /ml Dexamethasone;Moist Heat;Ultrasound;Gait training;Stair training;Functional mobility training;Therapeutic activities;Therapeutic exercise;Balance training;Neuromuscular re-education;Manual techniques;Dry needling;Taping    PT Next Visit Plan review HEP; continue with back care education; continue trial of DN vs manual work Rt iliacus/adductors/posterior hip- glut med/piriformis; stretching and core stabilization as tolerated; trial of bridging and wall squat? strengthening gluts   TENS unit helps home    PT Home Exercise Plan [facility]    Consulted and Agree with Plan of Care Patient           Patient will benefit from skilled therapeutic intervention in order to improve the following deficits and impairments:     Visit Diagnosis: Pain in right hip  Other symptoms and signs involving the musculoskeletal system  Abnormal posture  Other abnormalities of gait and mobility     Problem List Patient Active Problem List   Diagnosis Date Noted   Right hip pain 09/04/2019   Hand pain 09/04/2019   IUD (intrauterine device) in place 05/27/2019   Facial flushing 12/08/2018   Abnormal RBC 12/08/2018   ETD (Eustachian tube dysfunction), right 12/03/2018   Large breasts  01/18/2018   Dry skin 12/29/2016   Itching 12/29/2016  Elevated blood pressure reading 06/06/2016   Allergic rhinitis with postnasal drip 06/01/2016   Migraine variant 11/16/2015   Hallux valgus of left foot 02/08/2015   Metatarsalgia of left foot 02/08/2015   Class 2 obesity due to excess calories without serious comorbidity with body mass index (BMI) of 36.0 to 36.9 in adult 02/08/2015   Right-sided low back pain with right-sided sciatica 02/16/2014   Vitamin D insufficiency 02/03/2014   Need for prophylactic vaccination and inoculation against influenza 11/21/2012   WEIGHT GAIN 08/20/2008    Elaine Murillo Nilda Simmer PT, MPH  11/13/2019, 9:34 AM  [facility] [place], [place], [address] Phone: [phone]   Fax:  [phone]  Name: Elaine Murillo MRN: [id number] Date of Birth: February 24, 1971

## 2019-11-17 ENCOUNTER — Other Ambulatory Visit: Payer: Self-pay

## 2019-11-17 ENCOUNTER — Ambulatory Visit (INDEPENDENT_AMBULATORY_CARE_PROVIDER_SITE_OTHER): Payer: BC Managed Care – PPO | Admitting: Rehabilitative and Restorative Service Providers"

## 2019-11-17 ENCOUNTER — Encounter: Payer: Self-pay | Admitting: Rehabilitative and Restorative Service Providers"

## 2019-11-17 DIAGNOSIS — M25551 Pain in right hip: Secondary | ICD-10-CM

## 2019-11-17 DIAGNOSIS — R29898 Other symptoms and signs involving the musculoskeletal system: Secondary | ICD-10-CM

## 2019-11-17 DIAGNOSIS — R293 Abnormal posture: Secondary | ICD-10-CM

## 2019-11-17 DIAGNOSIS — R2689 Other abnormalities of gait and mobility: Secondary | ICD-10-CM | POA: Diagnosis not present

## 2019-11-17 NOTE — Patient Instructions (Signed)
Access Code: [email or web address]: [email or web address]: 09/13/2021Prepared by: Kerington Hildebrant HoltExercises  Prone Press Up - 2 x daily - 7 x weekly - 1 sets - 10 reps - 2-3 sec hold  Prone Quadriceps Stretch with Strap - 2 x daily - 7 x weekly - 1 sets - 3 reps - 30 sec hold  Standing Hip Extension with Counter Support - 2 x daily - 7 x weekly - 1-2 sets - 10 reps - 3-5 sec hold  Standing Hip Abduction with Counter Support - 2 x daily - 7 x weekly - 2-3 sets - 10 reps - 2-3 sec hold  Supine Transversus Abdominis Bracing with Pelvic Floor Contraction - 2 x daily - 7 x weekly - 1 sets - 10 reps - 10sec hold  Supine Scapular Protraction in Flexion with Dumbbells - 2 x daily - 7 x weekly - 1 sets - 10 reps - 2-3 sec hold  Warrior I - 2 x daily - 7 x weekly - 1 sets - 10 reps - 3 sec hold  Bridge - 2 x daily - 7 x weekly - 1-2 sets - 10 reps - 5 sec hold  Wall Quarter Squat - 2 x daily - 7 x weekly - 1-2 sets - 10 reps - 5-10 sec hold  Prone Hip Extension - 2 x daily - 7 x weekly - 1 sets - 10 reps - 3 sec hold  Plank on Counter - 2 x daily - 7 x weekly - 1 sets - 3 reps - 30-60 sec hold  Plank with Hip Extension on Counter - 2 x daily - 7 x weekly - 1 sets - 5-10 reps

## 2019-11-17 NOTE — Therapy (Signed)
[place], [place], [address] Phone: [phone]   Fax:  [phone]  Physical Therapy Treatment  Patient Details  Name: Elaine Murillo MRN: [id number] Date of Birth: 04/08/1970 Referring Provider (PT): Dr Luetta Nutting   Encounter Date: 11/17/2019   PT End of Session - 11/17/19 0848    Visit Number 7    Number of Visits 12    Date for PT Re-Evaluation 12/09/19    PT Start Time 0846    PT Stop Time 0935   MH end of treatment   PT Time Calculation (min) 49 min    Activity Tolerance Patient tolerated treatment well           Past Medical History:  Diagnosis Date   Anxiety    Uterine fibroids affecting pregnancy     Past Surgical History:  Procedure Laterality Date   TONSILLECTOMY      There were no vitals filed for this visit.   Subjective Assessment - 11/17/19 0852    Subjective Patient reports that she has had a great response to the standing stretch reaching up and to the Lt to stretch the Rt. Had a few good days. Irritated Saturday with standing and walking/shopping for > 1 hour then up moving through as well as sitting for a concert that evening - ended up lying down for the concert. Symptoms were worse Saturday night and Sunday morning. Took muscle relaxant with some improvement. Had  "big cramp" this morning. Some better now after stretching.    Pain Score 3    4/10 this morning earlier; 3/10 coming into therapy; now 1-2/10 with walking   Pain Location Hip    Pain Orientation Right    Pain Descriptors / Indicators Tightness    Pain Type Acute pain    Pain Onset More than a month ago    Pain Frequency Intermittent                             [facility] Adult PT Treatment/Exercise - 11/17/19 0001      Lumbar Exercises: Stretches   Hip Flexor Stretch Right;Left;2 reps;30 seconds   seated reaching up overhead  with same side arm    Hip Flexor Stretch Limitations  thomas stretch with PT assist - with contract relax hip extension     Standing Extension 3 reps;5 seconds    Press Ups 5 reps   3-5 sec hold - pause and exhale at end range    Sports administrator Right;2 reps;30 seconds   prone with strap last reps with foam roll distal thigh      Lumbar Exercises: Standing   Other Standing Lumbar Exercises warrior pose with Rt arm overhead; heel lifts x 10 - 3 sets       Moist Heat Therapy   Number Minutes Moist Heat 10 Minutes    Moist Heat Location Hip;Lumbar Spine   Rt anterior      Manual Therapy   Soft tissue mobilization deep tissue work through the anterior hip/psoas                       PT Long Term Goals - 10/28/19 1306      PT LONG TERM GOAL #1   Title Improve posture and alignment with patient to demonstrate upright posture with equal wt bearing with no pain    Time 6    Period Weeks  Status New    Target Date 12/09/19      PT LONG TERM GOAL #2   Title AROM through the trunk and bilat hips WFL's with no pain or limitations    Time 6    Period Weeks    Status New    Target Date 12/09/19      PT LONG TERM GOAL #3   Title normal gait pattern for functional household distances with no pain    Time 6    Period Weeks    Status New    Target Date 12/09/19      PT LONG TERM GOAL #4   Title independent in HEP    Time 6    Period Weeks    Status New    Target Date 12/09/19      PT LONG TERM GOAL #5   Title improve FOTO To </= 37% limitation    Time 6    Period Weeks    Status New    Target Date 12/09/19                 Plan - 11/17/19 0904    Clinical Impression Statement Good response to stretching and manual work at last visit. Patient experienced good days with minimal pain in the Rt hip. Patient then had increased pain on Saturday after a full day with varied activities. Good response to stretching and strengthening with gradual porgress. Patient encouraged to add 5-10 min for stretchig in the morning  before she starts mom duties. Progressing well.    Rehab Potential Good    PT Frequency 2x / week    PT Duration 6 weeks    PT Treatment/Interventions Patient/family education;ADLs/Self Care Home Management;Aquatic Therapy;Cryotherapy;Electrical Stimulation;Iontophoresis 4mg /ml Dexamethasone;Moist Heat;Ultrasound;Gait training;Stair training;Functional mobility training;Therapeutic activities;Therapeutic exercise;Balance training;Neuromuscular re-education;Manual techniques;Dry needling;Taping    PT Next Visit Plan review HEP; continue with back care education; continue trial of DN vs manual work Rt iliacus/adductors/posterior hip- glut med/piriformis; stretching and core stabilization as tolerated; strengthening gluts   TENS unit helps home    PT Home Exercise Plan [facility]    Consulted and Agree with Plan of Care Patient           Patient will benefit from skilled therapeutic intervention in order to improve the following deficits and impairments:     Visit Diagnosis: Pain in right hip  Other symptoms and signs involving the musculoskeletal system  Abnormal posture  Other abnormalities of gait and mobility     Problem List Patient Active Problem List   Diagnosis Date Noted   Right hip pain 09/04/2019   Hand pain 09/04/2019   IUD (intrauterine device) in place 05/27/2019   Facial flushing 12/08/2018   Abnormal RBC 12/08/2018   ETD (Eustachian tube dysfunction), right 12/03/2018   Large breasts 01/18/2018   Dry skin 12/29/2016   Itching 12/29/2016   Elevated blood pressure reading 06/06/2016   Allergic rhinitis with postnasal drip 06/01/2016   Migraine variant 11/16/2015   Hallux valgus of left foot 02/08/2015   Metatarsalgia of left foot 02/08/2015   Class 2 obesity due to excess calories without serious comorbidity with body mass index (BMI) of 36.0 to 36.9 in adult 02/08/2015   Right-sided low back pain with right-sided sciatica 02/16/2014   Vitamin D  insufficiency 02/03/2014   Need for prophylactic vaccination and inoculation against influenza 11/21/2012   WEIGHT GAIN 08/20/2008    Pesach Frisch Nilda Simmer PT, MPH  11/17/2019, 9:33 AM  [place]  [facility] [address] [place], [place], [address] Phone: [phone]   Fax:  [phone]  Name: Elaine Murillo MRN: [id number] Date of Birth: [birth year]

## 2019-11-18 DIAGNOSIS — J301 Allergic rhinitis due to pollen: Secondary | ICD-10-CM | POA: Diagnosis not present

## 2019-11-20 ENCOUNTER — Encounter: Payer: Self-pay | Admitting: Rehabilitative and Restorative Service Providers"

## 2019-11-20 ENCOUNTER — Other Ambulatory Visit: Payer: Self-pay

## 2019-11-20 ENCOUNTER — Ambulatory Visit (INDEPENDENT_AMBULATORY_CARE_PROVIDER_SITE_OTHER): Payer: BC Managed Care – PPO | Admitting: Rehabilitative and Restorative Service Providers"

## 2019-11-20 DIAGNOSIS — M25551 Pain in right hip: Secondary | ICD-10-CM | POA: Diagnosis not present

## 2019-11-20 DIAGNOSIS — R2689 Other abnormalities of gait and mobility: Secondary | ICD-10-CM | POA: Diagnosis not present

## 2019-11-20 DIAGNOSIS — R29898 Other symptoms and signs involving the musculoskeletal system: Secondary | ICD-10-CM | POA: Diagnosis not present

## 2019-11-20 DIAGNOSIS — R293 Abnormal posture: Secondary | ICD-10-CM

## 2019-11-20 DIAGNOSIS — J301 Allergic rhinitis due to pollen: Secondary | ICD-10-CM | POA: Diagnosis not present

## 2019-11-20 NOTE — Patient Instructions (Signed)
Access Code: [email or web address]: [email or web address]: 09/16/2021Prepared by: Mycah Mcdougall HoltExercises  Prone Press Up - 2 x daily - 7 x weekly - 1 sets - 10 reps - 2-3 sec hold  Prone Quadriceps Stretch with Strap - 2 x daily - 7 x weekly - 1 sets - 3 reps - 30 sec hold  Standing Hip Extension with Counter Support - 2 x daily - 7 x weekly - 1-2 sets - 10 reps - 3-5 sec hold  Standing Hip Abduction with Counter Support - 2 x daily - 7 x weekly - 2-3 sets - 10 reps - 2-3 sec hold  Supine Transversus Abdominis Bracing with Pelvic Floor Contraction - 2 x daily - 7 x weekly - 1 sets - 10 reps - 10sec hold  Supine Scapular Protraction in Flexion with Dumbbells - 2 x daily - 7 x weekly - 1 sets - 10 reps - 2-3 sec hold  Warrior I - 2 x daily - 7 x weekly - 1 sets - 10 reps - 3 sec hold  Bridge - 2 x daily - 7 x weekly - 1-2 sets - 10 reps - 5 sec hold  Wall Quarter Squat - 2 x daily - 7 x weekly - 1-2 sets - 10 reps - 5-10 sec hold  Prone Hip Extension - 2 x daily - 7 x weekly - 1 sets - 10 reps - 3 sec hold  Plank on Counter - 2 x daily - 7 x weekly - 1 sets - 3 reps - 30-60 sec hold  Plank with Hip Extension on Counter - 2 x daily - 7 x weekly - 1 sets - 5-10 reps  Prone Gluteal Sets - 2 x daily - 7 x weekly - 1 sets - 10 reps - 10 sec hold  Prone Hip Extension - 2 x daily - 7 x weekly - 1 sets - 10 reps - 3 sec hold

## 2019-11-20 NOTE — Therapy (Signed)
[place], [place], [address] Phone: [phone]   Fax:  [phone]  Physical Therapy Treatment  Patient Details  Name: Elaine Murillo MRN: [id number] Date of Birth: 1970-12-09 Referring Provider (PT): Dr Luetta Nutting   Encounter Date: 11/20/2019   PT End of Session - 11/20/19 1018    Visit Number 8    Number of Visits 12    Date for PT Re-Evaluation 12/09/19    PT Start Time 1013    PT Stop Time 1104    PT Time Calculation (min) 51 min    Activity Tolerance Patient tolerated treatment well           Past Medical History:  Diagnosis Date  . Anxiety   . Uterine fibroids affecting pregnancy     Past Surgical History:  Procedure Laterality Date  . TONSILLECTOMY      There were no vitals filed for this visit.   Subjective Assessment - 11/20/19 1015    Subjective Patient reports that she will feel tight in the Rt hip when standing and walking a "longer time" and turning in bed irritates symptoms. Seems related to softer mattress. Wonders about "dead butt syndorme". Wonders about cortisone injection. Remembers that she has had pain in the Rt hip since a child - would come and go. She would experience a sharp pain in the Rt hip.    Currently in Pain? Yes    Pain Score 2     Pain Location Hip    Pain Orientation Right    Pain Descriptors / Indicators Tightness    Pain Type Acute pain    Pain Onset More than a month ago    Pain Frequency Intermittent                             [facility] Adult PT Treatment/Exercise - 11/20/19 0001      Lumbar Exercises: Stretches   Hip Flexor Stretch Right;Left;2 reps;30 seconds   seated reaching up overhead  with same side arm    Hip Flexor Stretch Limitations thomas stretch with PT assist - with contract relax hip extension     Standing Extension 3 reps;5 seconds      Lumbar Exercises: Standing   Other Standing Lumbar Exercises wall  plank x 30 sec; hip extension at wall 10 reps 3 sec hold each LE     Other Standing Lumbar Exercises warrior pose with Rt arm overhead; heel lifts x 10 - 3 sets       Lumbar Exercises: Supine   Bridge 10 reps;5 seconds      Lumbar Exercises: Prone   Other Prone Lumbar Exercises glut set 10-15 sec hold x 8      Knee/Hip Exercises: Standing   Hip Extension AROM;Stengthening;Right;Left;2 sets;10 reps;Knee straight      Moist Heat Therapy   Number Minutes Moist Heat 10 Minutes    Moist Heat Location Hip;Lumbar Spine   Rt anterior      Manual Therapy   Manual therapy comments skilled palpation to assess tissue response to DN and manual work     Joint Mobilization PA mobs Rt hip pt prone     Soft tissue mobilization deep tissue work through the anterior hip/psoas    Myofascial Release Rt quad             Trigger Point Dry Needling - 11/20/19 0001    Consent Given? Yes  Education Handout Provided Previously provided    Dry Needling Comments Rt    Electrical Stimulation Performed with Dry Needling Yes   anterior hip/quad    Quadriceps Response Palpable increased muscle length    Rectus femoris Response Palpable increased muscle length;Twitch response elicited    Iliacus Response Palpable increased muscle length                PT Education - 11/20/19 1102    Education Details HEP    Person(s) Educated Patient    Methods Explanation;Demonstration;Tactile cues;Verbal cues;Handout    Comprehension Verbalized understanding;Returned demonstration;Verbal cues required;Tactile cues required               PT Long Term Goals - 10/28/19 1306      PT LONG TERM GOAL #1   Title Improve posture and alignment with patient to demonstrate upright posture with equal wt bearing with no pain    Time 6    Period Weeks    Status New    Target Date 12/09/19      PT LONG TERM GOAL #2   Title AROM through the trunk and bilat hips WFL's with no pain or limitations    Time 6     Period Weeks    Status New    Target Date 12/09/19      PT LONG TERM GOAL #3   Title normal gait pattern for functional household distances with no pain    Time 6    Period Weeks    Status New    Target Date 12/09/19      PT LONG TERM GOAL #4   Title independent in HEP    Time 6    Period Weeks    Status New    Target Date 12/09/19      PT LONG TERM GOAL #5   Title improve FOTO To </= 37% limitation    Time 6    Period Weeks    Status New    Target Date 12/09/19                 Plan - 11/20/19 1054    Clinical Impression Statement some gradual improvement. Better with stretching and avoiding sitting. Pt notices increased pain with sleeping on softer mattress. Persistent muscular tightness noted through the anterior hip through the psoas; iliacus; quads. Positive response with decreased tightness with DN/manual work. Added glut strengthening in prone; continued with bridging and hip extension in standing.    Rehab Potential Good    PT Frequency 2x / week    PT Duration 6 weeks    PT Treatment/Interventions Patient/family education;ADLs/Self Care Home Management;Aquatic Therapy;Cryotherapy;Electrical Stimulation;Iontophoresis 4mg /ml Dexamethasone;Moist Heat;Ultrasound;Gait training;Stair training;Functional mobility training;Therapeutic activities;Therapeutic exercise;Balance training;Neuromuscular re-education;Manual techniques;Dry needling;Taping    PT Next Visit Plan review HEP; continue with back care education; continue trial of DN vs manual work Rt iliacus/adductors/posterior hip- glut med/piriformis; stretching and posterior hip and core strengthening and stabilization as tolerated; strengthening gluts   TENS unit helps home    PT Home Exercise Plan [facility]    Consulted and Agree with Plan of Care Patient           Patient will benefit from skilled therapeutic intervention in order to improve the following deficits and impairments:     Visit Diagnosis: Pain in  right hip  Other symptoms and signs involving the musculoskeletal system  Abnormal posture  Other abnormalities of gait and mobility     Problem List Patient Active Problem  List   Diagnosis Date Noted  . Right hip pain 09/04/2019  . Hand pain 09/04/2019  . IUD (intrauterine device) in place 05/27/2019  . Facial flushing 12/08/2018  . Abnormal RBC 12/08/2018  . ETD (Eustachian tube dysfunction), right 12/03/2018  . Large breasts 01/18/2018  . Dry skin 12/29/2016  . Itching 12/29/2016  . Elevated blood pressure reading 06/06/2016  . Allergic rhinitis with postnasal drip 06/01/2016  . Migraine variant 11/16/2015  . Hallux valgus of left foot 02/08/2015  . Metatarsalgia of left foot 02/08/2015  . Class 2 obesity due to excess calories without serious comorbidity with body mass index (BMI) of 36.0 to 36.9 in adult 02/08/2015  . Right-sided low back pain with right-sided sciatica 02/16/2014  . Vitamin D insufficiency 02/03/2014  . Need for prophylactic vaccination and inoculation against influenza 11/21/2012  . WEIGHT GAIN 08/20/2008    Elaine Murillo 11/20/2019, 11:09 AM  [facility] [place], [place], [address] Phone: [phone]   Fax:  [phone]  Name: Elaine Murillo MRN: [id number] Date of Birth: Jul 09, 1970

## 2019-11-24 ENCOUNTER — Encounter: Payer: Self-pay | Admitting: Rehabilitative and Restorative Service Providers"

## 2019-11-24 ENCOUNTER — Ambulatory Visit (INDEPENDENT_AMBULATORY_CARE_PROVIDER_SITE_OTHER): Payer: BC Managed Care – PPO | Admitting: Rehabilitative and Restorative Service Providers"

## 2019-11-24 ENCOUNTER — Other Ambulatory Visit: Payer: Self-pay

## 2019-11-24 DIAGNOSIS — R29898 Other symptoms and signs involving the musculoskeletal system: Secondary | ICD-10-CM

## 2019-11-24 DIAGNOSIS — J301 Allergic rhinitis due to pollen: Secondary | ICD-10-CM | POA: Diagnosis not present

## 2019-11-24 DIAGNOSIS — R2689 Other abnormalities of gait and mobility: Secondary | ICD-10-CM | POA: Diagnosis not present

## 2019-11-24 DIAGNOSIS — M25551 Pain in right hip: Secondary | ICD-10-CM

## 2019-11-24 DIAGNOSIS — R293 Abnormal posture: Secondary | ICD-10-CM

## 2019-11-24 NOTE — Therapy (Signed)
[place], [place], [address] Phone: [phone]   Fax:  [phone]  Physical Therapy Treatment  Patient Details  Name: Elaine Murillo MRN: [id number] Date of Birth: March 02, 1971 Referring Provider (PT): Dr Luetta Nutting   Encounter Date: 11/24/2019   PT End of Session - 11/24/19 0851    Visit Number 9    Number of Visits 12    Date for PT Re-Evaluation 12/09/19    PT Start Time 6160    PT Stop Time 0940   moist heat end of treatment   PT Time Calculation (min) 53 min    Activity Tolerance Patient tolerated treatment well           Past Medical History:  Diagnosis Date  . Anxiety   . Uterine fibroids affecting pregnancy     Past Surgical History:  Procedure Laterality Date  . TONSILLECTOMY      There were no vitals filed for this visit.   Subjective Assessment - 11/24/19 0853    Subjective Sometimes a bit better. Still having pain more at the end of the day. Sleeping on the floor which is better that in bed. Had a "big cramp" in the Rt anterior hip last night sleeping with a big pillow btn knees    Currently in Pain? Yes    Pain Score 2     Pain Location Hip    Pain Orientation Right    Pain Descriptors / Indicators Tightness    Pain Type Acute pain              [facility] PT Assessment - 11/24/19 0001      Assessment   Medical Diagnosis Rt posterior lateral hip pain     Referring Provider (PT) Dr Luetta Nutting    Onset Date/Surgical Date 07/16/19    Hand Dominance Right    Next MD Visit Dr T - 11/03/19    Prior Therapy none       AROM   Lumbar Flexion 80% tightness pain Rt hip     Lumbar Extension 30% decreased pain     Lumbar - Right Side Bend 75%    Lumbar - Left Side Bend 75%    Lumbar - Right Rotation 55%    Lumbar - Left Rotation 55%      Flexibility   Hamstrings Rt 70 deg; Lt 70 deg     Quadriceps tight bilat end range ROM increasing      ITB tight Rt > Lt      Piriformis tight Rt > Lt       Palpation   Palpation comment muscular tightness through Rt iliopsoas; hip adductors; TFL/ITB; posterior hip through the gluts and piriformis                          [facility] Adult PT Treatment/Exercise - 11/24/19 0001      Therapeutic Activites    Therapeutic Activities --   myofacial ball release work standing and prone      Lumbar Exercises: Stretches   Hip Flexor Stretch Right;Left;2 reps;30 seconds   seated reaching up overhead  with same side arm    Hip Flexor Stretch Limitations half kneeling arm over head 30-45 sec     Standing Extension 3 reps;5 seconds      Lumbar Exercises: Aerobic   Other Aerobic Exercise walking forward and backward to work on gait patern, weight shift and hip extension  Lumbar Exercises: Standing   Other Standing Lumbar Exercises wall plank x 30 sec; hip extension at wall 10 reps 3 sec hold each LE     Other Standing Lumbar Exercises warrior pose with Rt arm overhead; heel lifts x 10 - 3 sets       Lumbar Exercises: Supine   Bridge 10 reps;5 seconds      Lumbar Exercises: Prone   Other Prone Lumbar Exercises glut set 10-15 sec hold x 8    Other Prone Lumbar Exercises prone over ball for hip extension 3-5 sec hold x 8 with PT assist for full hip extension       Knee/Hip Exercises: Standing   Hip Extension AROM;Stengthening;Right;Left;2 sets;10 reps;Knee straight      Moist Heat Therapy   Number Minutes Moist Heat 15 Minutes    Moist Heat Location Hip;Lumbar Spine   Rt anterior      Manual Therapy   Soft tissue mobilization deep tissue work through the anterior hip/psoas/hip flexors                  PT Education - 11/24/19 0950    Education Details aquatic therapy    Person(s) Educated Patient    Methods Explanation;Handout    Comprehension Verbalized understanding               PT Long Term Goals - 10/28/19 1306      PT LONG TERM GOAL #1   Title Improve posture and alignment  with patient to demonstrate upright posture with equal wt bearing with no pain    Time 6    Period Weeks    Status New    Target Date 12/09/19      PT LONG TERM GOAL #2   Title AROM through the trunk and bilat hips WFL's with no pain or limitations    Time 6    Period Weeks    Status New    Target Date 12/09/19      PT LONG TERM GOAL #3   Title normal gait pattern for functional household distances with no pain    Time 6    Period Weeks    Status New    Target Date 12/09/19      PT LONG TERM GOAL #4   Title independent in HEP    Time 6    Period Weeks    Status New    Target Date 12/09/19      PT LONG TERM GOAL #5   Title improve FOTO To </= 37% limitation    Time 6    Period Weeks    Status New    Target Date 12/09/19                 Plan - 11/24/19 0857    Clinical Impression Statement Up and down with continued increase in tightness in the anterior Rt hip through the illiopsoas and rectus femoris consistent with original injury and continued irritation of tissue with sitting, driving, biking, etc... Patient reports some decease in pain as well as improved function at times - but will have flare ups of symptoms related to incresae in activity. Flare ups result in incresaed pain and muscular tightness as well as decresaed muscle length and abnormal gait pattern. We have tried DN, manual work, MET techniques; strengthening and stretching. Focus has been on strengthening the gluts and stretching iliopsoas and rectus femoris. She is scheduled for aquatic therapy on Friday to see if she can  progress with strengthening iin the water better than on land.    Rehab Potential Good    PT Frequency 2x / week    PT Duration 6 weeks    PT Treatment/Interventions Patient/family education;ADLs/Self Care Home Management;Aquatic Therapy;Cryotherapy;Electrical Stimulation;Iontophoresis 4mg /ml Dexamethasone;Moist Heat;Ultrasound;Gait training;Stair training;Functional mobility  training;Therapeutic activities;Therapeutic exercise;Balance training;Neuromuscular re-education;Manual techniques;Dry needling;Taping    PT Next Visit Plan review HEP; back care education; continue trial of DN vs manual work Rt iliacus/adductors/posterior hip-glut med/piriformis; stretching; strengthening posterior hip and core stabilization as tolerated.  TENS unit helps home    PT Home Exercise Plan [facility]    Consulted and Agree with Plan of Care Patient           Patient will benefit from skilled therapeutic intervention in order to improve the following deficits and impairments:     Visit Diagnosis: Pain in right hip  Other symptoms and signs involving the musculoskeletal system  Abnormal posture  Other abnormalities of gait and mobility     Problem List Patient Active Problem List   Diagnosis Date Noted  . Right hip pain 09/04/2019  . Hand pain 09/04/2019  . IUD (intrauterine device) in place 05/27/2019  . Facial flushing 12/08/2018  . Abnormal RBC 12/08/2018  . ETD (Eustachian tube dysfunction), right 12/03/2018  . Large breasts 01/18/2018  . Dry skin 12/29/2016  . Itching 12/29/2016  . Elevated blood pressure reading 06/06/2016  . Allergic rhinitis with postnasal drip 06/01/2016  . Migraine variant 11/16/2015  . Hallux valgus of left foot 02/08/2015  . Metatarsalgia of left foot 02/08/2015  . Class 2 obesity due to excess calories without serious comorbidity with body mass index (BMI) of 36.0 to 36.9 in adult 02/08/2015  . Right-sided low back pain with right-sided sciatica 02/16/2014  . Vitamin D insufficiency 02/03/2014  . Need for prophylactic vaccination and inoculation against influenza 11/21/2012  . WEIGHT GAIN 08/20/2008    Rydan Gulyas Nilda Simmer PT, MPH  11/24/2019, 12:42 PM  [facility] [place], [place], [address] Phone: [phone]   Fax:  [phone]  Name: Elaine Murillo MRN:  [id number] Date of Birth: 07/17/1970

## 2019-11-24 NOTE — Patient Instructions (Signed)
   Aquatic Therapy: What to Expect!  Where:  [place], [place]  [address] [phone]    NOTE: Dennis Bast will receive an automated phone message  reminding you of your appointment and it will say the   appointment is at [facility] in [place]. We are  working to fix this - just know that you will meet [place] at pool!       How to Prepare: . Please make sure you drink 8 ounces of water about one hour prior to your pool session. . Sign in at the front desk upon your arrival.  Once on the pool deck, your therapist will ask you to sign the Patient  Consent and Assignment of Benefits form. . If you have a caregiver, they must attend the entire session with you (unless your primary therapists feels this is not necessary). The caregiver will be responsible for assisting with dressing as well as any toileting needs.  . Please arrive IN YOUR SUIT and a few minutes prior to your appointment - this helps to avoid delays in starting your session. . Please make sure to attend to any toileting needs prior to entering the pool. . You can bring your pool bag with you and place it on bench near our work space.   . Your therapist may take your blood pressure prior to, during, and after your session if indicated. . We usually try and create a home exercise program based on activities we do in the pool.  Please be thinking about who might be able to assist you in the pool should you want to participate in an aquatic home exercise program at the time of discharge.  Some patients do not want to or do not have the ability to participate in an aquatic home program - this is not a barrier in any way to you participating in aquatic therapy as part of your current therapy plan! About the pool: 1. Entering the pool: Your therapist will assist you; there are multiple ways to enter including stairs with railings, a walk-in ramp, a roll-in chair and a mechanical lift. Your  therapist will determine the most appropriate way for you. 2. Water temperature is usually between 86-87 degrees. 3. There may be other swimmers in the pool at the same time. 4. All sessions are 45 minutes.  Contact Info:  [place]  [address], [place], [place] [address]     Please call our [facility] office if you need to cancel or reschedule.   [phone]

## 2019-11-25 ENCOUNTER — Ambulatory Visit (INDEPENDENT_AMBULATORY_CARE_PROVIDER_SITE_OTHER): Payer: BC Managed Care – PPO | Admitting: Sports Medicine

## 2019-11-25 ENCOUNTER — Ambulatory Visit (INDEPENDENT_AMBULATORY_CARE_PROVIDER_SITE_OTHER): Payer: BC Managed Care – PPO

## 2019-11-25 DIAGNOSIS — M1611 Unilateral primary osteoarthritis, right hip: Secondary | ICD-10-CM | POA: Diagnosis not present

## 2019-11-25 DIAGNOSIS — D259 Leiomyoma of uterus, unspecified: Secondary | ICD-10-CM | POA: Diagnosis not present

## 2019-11-25 DIAGNOSIS — M25851 Other specified joint disorders, right hip: Secondary | ICD-10-CM

## 2019-11-25 DIAGNOSIS — M533 Sacrococcygeal disorders, not elsewhere classified: Secondary | ICD-10-CM | POA: Diagnosis not present

## 2019-11-25 DIAGNOSIS — M25451 Effusion, right hip: Secondary | ICD-10-CM | POA: Diagnosis not present

## 2019-11-25 MED ORDER — GADOBUTROL 1 MMOL/ML IV SOLN
1.0000 mL | Freq: Once | INTRAVENOUS | Status: AC | PRN
  Administered 2019-11-25: 1 mL via INTRAVENOUS

## 2019-11-25 NOTE — Progress Notes (Signed)
    Procedures performed today:    None.  Independent interpretation of notes and tests performed by another provider:   None.  Brief History, Exam, Impression, and Recommendations:    Elaine Murillo is a pleasant 49yo female who presents today with right hip pain that began in May. She noticed the pain after increasing her exercise routine including more cycling. She was managed by her PCP with diclofenac and PT. These interventions have helped but have not fully improved her pain. She is still having pain mostly at the end of the day or with increased movement of the hip. The pain is mostly localized deep in the hip. There is no pain upon palpation. She has pain on FABER maneuver indicating intraarticular process. I personally reviewed her Xrays which did show a possible CAM lesion demonstrating some hip impingement. It also demonstrated decreased joint space on the right consistent with OA. We are going to get a hip arthrogram today to evaluate for any intraarticular process including labral tears. With the arthrogram she will also receive a steroid injeciton which may help relieve her symptoms. She will follow up in 4-6 weeks for reevaluation and further intervention as needed.   Marcelino Duster, MS3   ___________________________________________ Gwen Her. Dianah Field, M.D., ABFM., CAQSM. Primary Care and [place] Instructor of [employer] [facility] of Medicine

## 2019-11-25 NOTE — Assessment & Plan Note (Addendum)
This is a very pleasant 49 year old female with a long history of right hip pain, localized groin, laterally, worse when sleeping. She has significant pain with flexion, adduction, internal rotation, external rotation. She has had formal physical therapy/greater than 6 weeks of conservative measures. She has significant lack of internal rotation on the right side compared to the left. I did personally review her x-rays which do show evidence of femoral acetabular impingement with both cam and pincer lesions as well as loss of joint space, subchondral sclerosis, all consistent with osteoarthritis as well. Due to the symptoms and the potential for surgical intervention we are going to proceed with MR arthrogram. She will return to see me at 10:15 today for arthrogram injection and we will plan her MRI for 10:45 today.

## 2019-11-27 ENCOUNTER — Encounter: Payer: BC Managed Care – PPO | Admitting: Rehabilitative and Restorative Service Providers"

## 2019-11-27 DIAGNOSIS — J301 Allergic rhinitis due to pollen: Secondary | ICD-10-CM | POA: Diagnosis not present

## 2019-11-28 ENCOUNTER — Other Ambulatory Visit: Payer: Self-pay

## 2019-11-28 ENCOUNTER — Ambulatory Visit (INDEPENDENT_AMBULATORY_CARE_PROVIDER_SITE_OTHER): Payer: BC Managed Care – PPO | Admitting: Physical Therapy

## 2019-11-28 ENCOUNTER — Encounter: Payer: Self-pay | Admitting: Physical Therapy

## 2019-11-28 DIAGNOSIS — R29898 Other symptoms and signs involving the musculoskeletal system: Secondary | ICD-10-CM

## 2019-11-28 DIAGNOSIS — R293 Abnormal posture: Secondary | ICD-10-CM | POA: Diagnosis not present

## 2019-11-28 DIAGNOSIS — M25551 Pain in right hip: Secondary | ICD-10-CM

## 2019-11-28 NOTE — Therapy (Signed)
[place], [place], [address] Phone: [phone]   Fax:  [phone]  Physical Therapy Treatment  Patient Details  Name: Elaine Murillo MRN: [id number] Date of Birth: May 11, 1970 Referring Provider (PT): Dr Luetta Nutting   Encounter Date: 11/28/2019   PT End of Session - 11/28/19 1616    Visit Number 10    Number of Visits 12    Date for PT Re-Evaluation 12/09/19    PT Start Time 1230    PT Stop Time 1315    PT Time Calculation (min) 45 min    Activity Tolerance Patient tolerated treatment well    Behavior During Therapy [facility] for tasks assessed/performed           Past Medical History:  Diagnosis Date  . Anxiety   . Uterine fibroids affecting pregnancy     Past Surgical History:  Procedure Laterality Date  . TONSILLECTOMY      There were no vitals filed for this visit.   Subjective Assessment - 11/28/19 1621    Subjective Pt reports she is moving  better sincer receiving injection.  She has occasional twinges in hip, but overall it has improved.    Currently in Pain? Yes    Pain Score 1     Pain Location Hip    Pain Orientation Right    Pain Descriptors / Indicators Tightness;Aching    Aggravating Factors  prolonged walking, hip ER, prolonged sitting    Pain Relieving Factors hip flexor stretch, medication           Pt seen for aquatic therapy today.  Treatment took place in water 3-4 ft in depth at [facility]. Temp of water was 87 deg.  Pt entered/exited the pool via stairs with Lt hand rail independently.  Treatment:   Retro gait holding noodle, focusing on hip ext x 60 meters x 2 reps (one at beginning, one at end)  Side stepping 60 meters both Rt and Lt.  Hip IR/ ER (with hip flexed ~60 deg) attempts with RLE x 10, limited and painful. Heel raises, hip abdct, hip ext x 10 each leg with UE support on edge of pool.  Mini squats with hands on side of pool x 10-  some increase in ant Rt hip. Squats to submerge to shoulder depth, pushing floatation dumbbell below water with legs wide and allowing heels to come up x 5 - pt reported "crackling" in Rt ant hip; x 5 reps with knees straight ahead with improved tolerance.   Rt hip flexor stretch in lunge position with Rt arm over head x 20 sec x 4 reps throughout session. Sitting on noodle- between LE, body submerged in water to shoulders: forward bouncing 20 meters, crouch walk forward/backward while sitting on noodle x 15 meters.  Floating in prone, noodle under armpits: flutter kick x 15 meters. Pt attempted breast stroke kick 3 reps and stopped due to increased pain; advised pt to avoid this motion for now.   Pt requires buoyancy for support and to offload joints with strengthening exercises. Viscosity of the water is needed for resistance of strengthening; water current perturbations provides challenge to standing activities while unsupported, requiring increased core activation.      PT Long Term Goals - 10/28/19 1306      PT LONG TERM GOAL #1   Title Improve posture and alignment with patient to demonstrate upright posture with equal wt bearing with no pain    Time  6    Period Weeks    Status New    Target Date 12/09/19      PT LONG TERM GOAL #2   Title AROM through the trunk and bilat hips WFL's with no pain or limitations    Time 6    Period Weeks    Status New    Target Date 12/09/19      PT LONG TERM GOAL #3   Title normal gait pattern for functional household distances with no pain    Time 6    Period Weeks    Status New    Target Date 12/09/19      PT LONG TERM GOAL #4   Title independent in HEP    Time 6    Period Weeks    Status New    Target Date 12/09/19      PT LONG TERM GOAL #5   Title improve FOTO To </= 37% limitation    Time 6    Period Weeks    Status New    Target Date 12/09/19                 Plan - 11/28/19 1617    Clinical Impression Statement Pt  had a few brief episodes of sharp "stabbing" pain in Rt ant hip with movements of RLE into ER or Abdct with ER.  Pain reduced with Rt hip ext stretch.  Pt tolerated all other exercises without increas ein discomfort.  Encouraged pt to consider rethinking workouts to those that maker her body feel better with less pain (ie: swimming, yoga).  Pt voiced interest at end of session in strengthening her RLE to be able to ascend stairs in reciprocal pattern.    Rehab Potential Good    PT Frequency 2x / week    PT Duration 6 weeks    PT Treatment/Interventions Patient/family education;ADLs/Self Care Home Management;Aquatic Therapy;Cryotherapy;Electrical Stimulation;Iontophoresis 4mg /ml Dexamethasone;Moist Heat;Ultrasound;Gait training;Stair training;Functional mobility training;Therapeutic activities;Therapeutic exercise;Balance training;Neuromuscular re-education;Manual techniques;Dry needling;Taping    PT Next Visit Plan continue DN vs manual work Rt iliacus/adductors/posterior hip-glut med/piriformis; stretching; trial of 2" forward step ups.    PT Home Exercise Plan [email or web address]    Consulted and Agree with Plan of Care Patient           Patient will benefit from skilled therapeutic intervention in order to improve the following deficits and impairments:  Abnormal gait, Difficulty walking, Increased fascial restricitons, Decreased endurance, Decreased activity tolerance, Pain, Impaired flexibility, Improper body mechanics, Decreased mobility, Decreased strength, Postural dysfunction  Visit Diagnosis: Pain in right hip  Other symptoms and signs involving the musculoskeletal system  Abnormal posture     Problem List Patient Active Problem List   Diagnosis Date Noted  . Hip impingement syndrome, right 11/25/2019  . Right hip pain 09/04/2019  . Hand pain 09/04/2019  . IUD (intrauterine device) in place 05/27/2019  . Facial flushing 12/08/2018  . Abnormal RBC 12/08/2018  . ETD (Eustachian tube  dysfunction), right 12/03/2018  . Large breasts 01/18/2018  . Dry skin 12/29/2016  . Itching 12/29/2016  . Elevated blood pressure reading 06/06/2016  . Allergic rhinitis with postnasal drip 06/01/2016  . Migraine variant 11/16/2015  . Hallux valgus of left foot 02/08/2015  . Metatarsalgia of left foot 02/08/2015  . Class 2 obesity due to excess calories without serious comorbidity with body mass index (BMI) of 36.0 to 36.9 in adult 02/08/2015  . Right-sided low back pain with right-sided sciatica 02/16/2014  . Vitamin  D insufficiency 02/03/2014  . Need for prophylactic vaccination and inoculation against influenza 11/21/2012  . WEIGHT GAIN 08/20/2008   Kerin Perna, PTA 11/28/19 4:34 PM  [place], [place], [address] Phone: [phone]   Fax:  [phone]  Name: Elaine Murillo MRN: [id number] Date of Birth: March 16, 1970

## 2019-12-01 ENCOUNTER — Other Ambulatory Visit: Payer: Self-pay

## 2019-12-01 ENCOUNTER — Encounter: Payer: Self-pay | Admitting: Rehabilitative and Restorative Service Providers"

## 2019-12-01 ENCOUNTER — Ambulatory Visit (INDEPENDENT_AMBULATORY_CARE_PROVIDER_SITE_OTHER): Payer: BC Managed Care – PPO | Admitting: Rehabilitative and Restorative Service Providers"

## 2019-12-01 DIAGNOSIS — R293 Abnormal posture: Secondary | ICD-10-CM

## 2019-12-01 DIAGNOSIS — M25551 Pain in right hip: Secondary | ICD-10-CM

## 2019-12-01 DIAGNOSIS — R29898 Other symptoms and signs involving the musculoskeletal system: Secondary | ICD-10-CM | POA: Diagnosis not present

## 2019-12-01 DIAGNOSIS — R2689 Other abnormalities of gait and mobility: Secondary | ICD-10-CM | POA: Diagnosis not present

## 2019-12-01 NOTE — Patient Instructions (Signed)
Access Code: [email or web address]: [email or web address]: 09/27/2021Prepared by: Dell Hurtubise HoltExercises  Prone Press Up - 2 x daily - 7 x weekly - 1 sets - 10 reps - 2-3 sec hold  Prone Quadriceps Stretch with Strap - 2 x daily - 7 x weekly - 1 sets - 3 reps - 30 sec hold  Standing Hip Extension with Counter Support - 2 x daily - 7 x weekly - 1-2 sets - 10 reps - 3-5 sec hold  Standing Hip Abduction with Counter Support - 2 x daily - 7 x weekly - 2-3 sets - 10 reps - 2-3 sec hold  Supine Transversus Abdominis Bracing with Pelvic Floor Contraction - 2 x daily - 7 x weekly - 1 sets - 10 reps - 10sec hold  Supine Scapular Protraction in Flexion with Dumbbells - 2 x daily - 7 x weekly - 1 sets - 10 reps - 2-3 sec hold  Warrior I - 2 x daily - 7 x weekly - 1 sets - 10 reps - 3 sec hold  Bridge - 2 x daily - 7 x weekly - 1-2 sets - 10 reps - 5 sec hold  Wall Quarter Squat - 2 x daily - 7 x weekly - 1-2 sets - 10 reps - 5-10 sec hold  Prone Hip Extension - 2 x daily - 7 x weekly - 1 sets - 10 reps - 3 sec hold  Plank on Counter - 2 x daily - 7 x weekly - 1 sets - 3 reps - 30-60 sec hold  Plank with Hip Extension on Counter - 2 x daily - 7 x weekly - 1 sets - 5-10 reps  Prone Gluteal Sets - 2 x daily - 7 x weekly - 1 sets - 10 reps - 10 sec hold  Prone Hip Extension - 2 x daily - 7 x weekly - 1 sets - 10 reps - 3 sec hold  Supine Bridge with Knee Extension and Pelvic Floor Contraction - 2 x daily - 7 x weekly - 1-2 sets - 10 reps - 5 sec hold  Prone Hip Extension on Swiss Ball - 2 x daily - 7 x weekly - 1 sets - 10 reps - 5-10sec hold  Single Leg Quarter Squat with Swiss Ball at [employer] - 2 x daily - 7 x weekly - 1 sets - 10 reps - 5-10 sec hold

## 2019-12-01 NOTE — Therapy (Signed)
[facility] [address] [place], [place], [address] Phone: [phone]   Fax:  [phone]  Physical Therapy Treatment  Patient Details  Name: Elaine Murillo MRN: [id number] Date of Birth: [birth year] Referring Provider (PT): Dr Luetta Nutting   Encounter Date: 12/01/2019   PT End of Session - 12/01/19 0837    Visit Number 11    Number of Visits 12    Date for PT Re-Evaluation 12/09/19    PT Start Time 0837    PT Stop Time 0928    PT Time Calculation (min) 51 min    Activity Tolerance Patient tolerated treatment well           Past Medical History:  Diagnosis Date  . Anxiety   . Uterine fibroids affecting pregnancy     Past Surgical History:  Procedure Laterality Date  . TONSILLECTOMY      There were no vitals filed for this visit.   Subjective Assessment - 12/01/19 0838    Subjective Patient reports that she had an injection last week and that has helped "a lot". She had to walk more and at a faster pace over the weekend and that was difficult. She has some pain and was very tired at the end of the day.    Currently in Pain? Yes    Pain Score 1     Pain Location Hip    Pain Orientation Right    Pain Descriptors / Indicators Aching;Tightness    Pain Type Acute pain              [facility] PT Assessment - 12/01/19 0001      Assessment   Medical Diagnosis Rt posterior lateral hip pain     Referring Provider (PT) Dr Luetta Nutting    Onset Date/Surgical Date 07/16/19    Hand Dominance Right    Next MD Visit Dr T - 11/03/19    Prior Therapy none       AROM   Lumbar Flexion 85% minimal pain     Lumbar Extension 50%       Palpation   Palpation comment sinificant decrease in muscular tightness through Rt iliopsoas; hip adductors; TFL/ITB; posterior hip through the gluts and piriformis                          [facility] Adult PT Treatment/Exercise - 12/01/19 0001      Lumbar Exercises:  Stretches   Hip Flexor Stretch Right;Left;2 reps;30 seconds   seated & standing reaching up overhead w/same side arm    Hip Flexor Stretch Limitations half kneeling arm over head 30-45 sec       Lumbar Exercises: Standing   Wall Slides 15 reps;5 seconds   w/swiss ball x5; Rt LE back x 5; lifting Lt LE x 5; 5-10sec     Lumbar Exercises: Supine   Bridge 10 reps;5 seconds    Bridge Limitations bridging with knee ext 5 sec hold x 10       Lumbar Exercises: Prone   Other Prone Lumbar Exercises glut set 10-15 sec hold x 8    Other Prone Lumbar Exercises prone over ball for hip extension 5-10 sec hold x 8 with PT assist for full hip extension                   PT Education - 12/01/19 0925    Education Details HEP    Person(s) Educated Patient  Methods Explanation;Demonstration;Tactile cues;Verbal cues;Handout    Comprehension Verbalized understanding;Returned demonstration;Verbal cues required;Tactile cues required               PT Long Term Goals - 10/28/19 1306      PT LONG TERM GOAL #1   Title Improve posture and alignment with patient to demonstrate upright posture with equal wt bearing with no pain    Time 6    Period Weeks    Status New    Target Date 12/09/19      PT LONG TERM GOAL #2   Title AROM through the trunk and bilat hips WFL's with no pain or limitations    Time 6    Period Weeks    Status New    Target Date 12/09/19      PT LONG TERM GOAL #3   Title normal gait pattern for functional household distances with no pain    Time 6    Period Weeks    Status New    Target Date 12/09/19      PT LONG TERM GOAL #4   Title independent in HEP    Time 6    Period Weeks    Status New    Target Date 12/09/19      PT LONG TERM GOAL #5   Title improve FOTO To </= 37% limitation    Time 6    Period Weeks    Status New    Target Date 12/09/19                 Plan - 12/01/19 0851    Clinical Impression Statement Significant improvement with  cortisone injection. Discussion of graded activity level to regain endurance. Added walking and strengthening for Rt LE. Continued pain and discomfort in the Rt anterior hip. Significant improvement in palpable tightness and increase in exercises tolerance; Rt hip extension. Progressing well toward stated goals of therapy.    Rehab Potential Good    PT Frequency 2x / week    PT Duration 6 weeks    PT Treatment/Interventions Patient/family education;ADLs/Self Care Home Management;Aquatic Therapy;Cryotherapy;Electrical Stimulation;Iontophoresis 4mg /ml Dexamethasone;Moist Heat;Ultrasound;Gait training;Stair training;Functional mobility training;Therapeutic activities;Therapeutic exercise;Balance training;Neuromuscular re-education;Manual techniques;Dry needling;Taping    PT Next Visit Plan continue DN vs manual work Rt iliacus/adductors/posterior hip-glut med/piriformis; stretching; added trial of 4" forward step ups - up with Rt down with Lt; added bridging with alternated knee extension. Will progress with glut strengthening    PT Home Exercise Plan [facility]    Consulted and Agree with Plan of Care Patient           Patient will benefit from skilled therapeutic intervention in order to improve the following deficits and impairments:     Visit Diagnosis: Pain in right hip  Other symptoms and signs involving the musculoskeletal system  Abnormal posture  Other abnormalities of gait and mobility     Problem List Patient Active Problem List   Diagnosis Date Noted  . Hip impingement syndrome, right 11/25/2019  . Right hip pain 09/04/2019  . Hand pain 09/04/2019  . IUD (intrauterine device) in place 05/27/2019  . Facial flushing 12/08/2018  . Abnormal RBC 12/08/2018  . ETD (Eustachian tube dysfunction), right 12/03/2018  . Large breasts 01/18/2018  . Dry skin 12/29/2016  . Itching 12/29/2016  . Elevated blood pressure reading 06/06/2016  . Allergic rhinitis with postnasal drip  06/01/2016  . Migraine variant 11/16/2015  . Hallux valgus of left foot 02/08/2015  . Metatarsalgia of left  foot 02/08/2015  . Class 2 obesity due to excess calories without serious comorbidity with body mass index (BMI) of 36.0 to 36.9 in adult 02/08/2015  . Right-sided low back pain with right-sided sciatica 02/16/2014  . Vitamin D insufficiency 02/03/2014  . Need for prophylactic vaccination and inoculation against influenza 11/21/2012  . WEIGHT GAIN 08/20/2008    Omarian Jaquith Nilda Simmer PT, MPH  12/01/2019, 9:36 AM  [facility] [place] [address] [place], [place], [address] Phone: [phone]   Fax:  [phone]  Name: Reatha Sur MRN: [id number] Date of Birth: May 28, 1970

## 2019-12-02 DIAGNOSIS — J301 Allergic rhinitis due to pollen: Secondary | ICD-10-CM | POA: Diagnosis not present

## 2019-12-03 ENCOUNTER — Ambulatory Visit (INDEPENDENT_AMBULATORY_CARE_PROVIDER_SITE_OTHER): Payer: BC Managed Care – PPO | Admitting: Physician Assistant

## 2019-12-03 ENCOUNTER — Encounter: Payer: Self-pay | Admitting: Physician Assistant

## 2019-12-03 ENCOUNTER — Other Ambulatory Visit: Payer: Self-pay

## 2019-12-03 ENCOUNTER — Telehealth: Payer: Self-pay | Admitting: Physician Assistant

## 2019-12-03 VITALS — BP 154/85 | HR 86 | Ht 64.0 in | Wt 215.0 lb

## 2019-12-03 DIAGNOSIS — Z23 Encounter for immunization: Secondary | ICD-10-CM | POA: Diagnosis not present

## 2019-12-03 DIAGNOSIS — Z1211 Encounter for screening for malignant neoplasm of colon: Secondary | ICD-10-CM

## 2019-12-03 DIAGNOSIS — Z1329 Encounter for screening for other suspected endocrine disorder: Secondary | ICD-10-CM

## 2019-12-03 DIAGNOSIS — Z0000 Encounter for general adult medical examination without abnormal findings: Secondary | ICD-10-CM | POA: Diagnosis not present

## 2019-12-03 DIAGNOSIS — R030 Elevated blood-pressure reading, without diagnosis of hypertension: Secondary | ICD-10-CM

## 2019-12-03 DIAGNOSIS — Z13220 Encounter for screening for lipoid disorders: Secondary | ICD-10-CM | POA: Diagnosis not present

## 2019-12-03 DIAGNOSIS — Z131 Encounter for screening for diabetes mellitus: Secondary | ICD-10-CM | POA: Diagnosis not present

## 2019-12-03 DIAGNOSIS — Z1159 Encounter for screening for other viral diseases: Secondary | ICD-10-CM

## 2019-12-03 NOTE — Telephone Encounter (Signed)
Patient was just seen for a physical and headed down to the Lab for lab work, she was questioning about the Hep C blood test, if there was a specific reason for this test, or if there was a reason that Elaine Murillo wanted her to have it done, as it is not one covered by her insurance. I spoke with triage & they told me to let her know that she can always omit this test and not have it done since it is not covered by insurance. Patient still wanted to know if the HEP C test was one that Elaine Murillo wanted her to have done or if it is a reason for it. Please Advise, AM

## 2019-12-03 NOTE — Progress Notes (Signed)
Subjective:     Elaine Murillo is a 49 y.o. female and is here for a comprehensive physical exam. The patient reports no problems.  She has IUD now and not taking OCP.  Her allergies are bad. She is on allergy shots.    Social History   Socioeconomic History  . Marital status: Married    Spouse name: Not on file  . Number of children: Not on file  . Years of education: Not on file  . Highest education level: Not on file  Occupational History  . Occupation: homemaker  Tobacco Use  . Smoking status: Never Smoker  . Smokeless tobacco: Never Used  Vaping Use  . Vaping Use: Never used  Substance and Sexual Activity  . Alcohol use: No  . Drug use: No  . Sexual activity: Yes    Partners: Male    Birth control/protection: I.U.D.  Other Topics Concern  . Not on file  Social History Narrative  . Not on file   Social Determinants of Health   Financial Resource Strain:   . Difficulty of Paying Living Expenses: Not on file  Food Insecurity:   . Worried About Charity fundraiser in the Last Year: Not on file  . Ran Out of Food in the Last Year: Not on file  Transportation Needs:   . Lack of Transportation (Medical): Not on file  . Lack of Transportation (Non-Medical): Not on file  Physical Activity:   . Days of Exercise per Week: Not on file  . Minutes of Exercise per Session: Not on file  Stress:   . Feeling of Stress : Not on file  Social Connections:   . Frequency of Communication with Friends and Family: Not on file  . Frequency of Social Gatherings with Friends and Family: Not on file  . Attends Religious Services: Not on file  . Active Member of Clubs or Organizations: Not on file  . Attends Archivist Meetings: Not on file  . Marital Status: Not on file  Intimate Partner Violence:   . Fear of Current or Ex-Partner: Not on file  . Emotionally Abused: Not on file  . Physically Abused: Not on file  . Sexually Abused: Not on file   Health Maintenance  Topic  Date Due  . Hepatitis C Screening  Never done  . MAMMOGRAM  02/27/2020  . TETANUS/TDAP  04/10/2022  . PAP SMEAR-Modifier  05/27/2022  . INFLUENZA VACCINE  Completed  . COVID-19 Vaccine  Completed  . HIV Screening  Completed    The following portions of the patient's history were reviewed and updated as appropriate: allergies, current medications, past family history, past medical history, past social history, past surgical history and problem list.  Review of Systems A comprehensive review of systems was negative.   Objective:    BP (!) 154/85   Pulse 86   Ht 5\' 4"  (1.626 m)   Wt 215 lb (97.5 kg)   BMI 36.90 kg/m  General appearance: alert, cooperative, appears stated age and moderately obese Head: Normocephalic, without obvious abnormality, atraumatic Eyes: conjunctivae/corneas clear. PERRL, EOM's intact. Fundi benign. Ears: normal TM's and external ear canals both ears Nose: Nares normal. Septum midline. Mucosa normal. No drainage or sinus tenderness. Throat: lips, mucosa, and tongue normal; teeth and gums normal Neck: no adenopathy, no carotid bruit, no JVD, supple, symmetrical, trachea midline and thyroid not enlarged, symmetric, no tenderness/mass/nodules Back: symmetric, no curvature. ROM normal. No CVA tenderness. Lungs: clear to auscultation bilaterally  Heart: regular rate and rhythm, S1, S2 normal, no murmur, click, rub or gallop Abdomen: soft, non-tender; bowel sounds normal; no masses,  no organomegaly Extremities: extremities normal, atraumatic, no cyanosis or edema Pulses: 2+ and symmetric Skin: Skin color, texture, turgor normal. No rashes or lesions Lymph nodes: Cervical, supraclavicular, and axillary nodes normal. Neurologic: Alert and oriented X 3, normal strength and tone. Normal symmetric reflexes. Normal coordination and gait   .Marland Kitchen Depression screen [facility] 2/9 12/03/2019 12/03/2018 01/18/2018 12/27/2016  Decreased Interest 0 0 0 0  Down, Depressed, Hopeless 0 0  0 0  PHQ - 2 Score 0 0 0 0  Altered sleeping 0 0 - -  Tired, decreased energy 1 1 - -  Change in appetite 0 1 - -  Feeling bad or failure about yourself  0 0 - -  Trouble concentrating 0 0 - -  Moving slowly or fidgety/restless 0 0 - -  Suicidal thoughts 0 0 - -  PHQ-9 Score 1 2 - -  Difficult doing work/chores Not difficult at all Not difficult at all - -   .Marland Kitchen GAD 7 : Generalized Anxiety Score 12/03/2019 12/03/2018  Nervous, Anxious, on Edge 0 0  Control/stop worrying 0 0  Worry too much - different things 0 0  Trouble relaxing 0 0  Restless 0 0  Easily annoyed or irritable 0 0  Afraid - awful might happen 0 0  Total GAD 7 Score 0 0  Anxiety Difficulty Not difficult at all Not difficult at all     Assessment:    Healthy female exam.      Plan:    Marland KitchenMarland KitchenMarli was seen today for annual exam.  Diagnoses and all orders for this visit:  Routine physical examination -     COMPLETE METABOLIC PANEL WITH GFR -     CBC with Differential/Platelet -     Lipid Panel w/reflex Direct LDL -     TSH -     Hepatitis C Antibody  Flu vaccine need -     Flu Vaccine QUAD 36+ mos IM  Screening for diabetes mellitus -     COMPLETE METABOLIC PANEL WITH GFR  Screening for lipid disorders -     Lipid Panel w/reflex Direct LDL  Thyroid disorder screen -     TSH  Encounter for hepatitis C screening test for low risk patient -     Hepatitis C Antibody  Elevated blood pressure reading   .Marland Kitchen Discussed 150 minutes of exercise a week.  Encouraged vitamin D 1000 units and Calcium 1300mg  or 4 servings of dairy a day.  Fasting labs ordered.  Mood screening good.  Mammogram ordered.  Pap UTD.  Sent mychart to discuss getting colonoscopy.  covid vaccine done.  Flu UTD.    BP elevated. Pt did just have corticosteroid shot recently. Could be some of increase. Hx of white coat hypertension. Start recording BP at home and report readings. Discussed importance of BP control. Discussed diet  changes and regular exercise.   .   See After Visit Summary for Counseling Recommendations

## 2019-12-03 NOTE — Telephone Encounter (Signed)
Hep C is a once in lifetime screening that we are supposed to get. Added on as a part of protocol. She can decline it if wanted too.

## 2019-12-03 NOTE — Patient Instructions (Addendum)
Health Maintenance, Female Adopting a healthy lifestyle and getting preventive care are important in promoting health and wellness. Ask your health care provider about:  The right schedule for you to have regular tests and exams.  Things you can do on your own to prevent diseases and keep yourself healthy. What should I know about diet, weight, and exercise? Eat a healthy diet   Eat a diet that includes plenty of vegetables, fruits, low-fat dairy products, and lean protein.  Do not eat a lot of foods that are high in solid fats, added sugars, or sodium. Maintain a healthy weight Body mass index (BMI) is used to identify weight problems. It estimates body fat based on height and weight. Your health care provider can help determine your BMI and help you achieve or maintain a healthy weight. Get regular exercise Get regular exercise. This is one of the most important things you can do for your health. Most adults should:  Exercise for at least 150 minutes each week. The exercise should increase your heart rate and make you sweat (moderate-intensity exercise).  Do strengthening exercises at least twice a week. This is in addition to the moderate-intensity exercise.  Spend less time sitting. Even light physical activity can be beneficial. Watch cholesterol and blood lipids Have your blood tested for lipids and cholesterol at 49 years of age, then have this test every 5 years. Have your cholesterol levels checked more often if:  Your lipid or cholesterol levels are high.  You are older than 49 years of age.  You are at high risk for heart disease. What should I know about cancer screening? Depending on your health history and family history, you may need to have cancer screening at various ages. This may include screening for:  Breast cancer.  Cervical cancer.  Colorectal cancer.  Skin cancer.  Lung cancer. What should I know about heart disease, diabetes, and high blood  pressure? Blood pressure and heart disease  High blood pressure causes heart disease and increases the risk of stroke. This is more likely to develop in people who have high blood pressure readings, are of African descent, or are overweight.  Have your blood pressure checked: ? Every 3-5 years if you are 18-39 years of age. ? Every year if you are 40 years old or older. Diabetes Have regular diabetes screenings. This checks your fasting blood sugar level. Have the screening done:  Once every three years after age 40 if you are at a normal weight and have a low risk for diabetes.  More often and at a younger age if you are overweight or have a high risk for diabetes. What should I know about preventing infection? Hepatitis B If you have a higher risk for hepatitis B, you should be screened for this virus. Talk with your health care provider to find out if you are at risk for hepatitis B infection. Hepatitis C Testing is recommended for:  Everyone born from 1945 through 1965.  Anyone with known risk factors for hepatitis C. Sexually transmitted infections (STIs)  Get screened for STIs, including gonorrhea and chlamydia, if: ? You are sexually active and are younger than 49 years of age. ? You are older than 49 years of age and your health care provider tells you that you are at risk for this type of infection. ? Your sexual activity has changed since you were last screened, and you are at increased risk for chlamydia or gonorrhea. Ask your health care provider if   you are at risk.  Ask your health care provider about whether you are at high risk for HIV. Your health care provider may recommend a prescription medicine to help prevent HIV infection. If you choose to take medicine to prevent HIV, you should first get tested for HIV. You should then be tested every 3 months for as long as you are taking the medicine. Pregnancy  If you are about to stop having your period (premenopausal) and  you may become pregnant, seek counseling before you get pregnant.  Take 400 to 800 micrograms (mcg) of folic acid every day if you become pregnant.  Ask for birth control (contraception) if you want to prevent pregnancy. Osteoporosis and menopause Osteoporosis is a disease in which the bones lose minerals and strength with aging. This can result in bone fractures. If you are 44 years old or older, or if you are at risk for osteoporosis and fractures, ask your health care provider if you should:  Be screened for bone loss.  Take a calcium or vitamin D supplement to lower your risk of fractures.  Be given hormone replacement therapy (HRT) to treat symptoms of menopause. Follow these instructions at home: Lifestyle  Do not use any products that contain nicotine or tobacco, such as cigarettes, e-cigarettes, and chewing tobacco. If you need help quitting, ask your health care provider.  Do not use street drugs.  Do not share needles.  Ask your health care provider for help if you need support or information about quitting drugs. Alcohol use  Do not drink alcohol if: ? Your health care provider tells you not to drink. ? You are pregnant, may be pregnant, or are planning to become pregnant.  If you drink alcohol: ? Limit how much you use to 0-1 drink a day. ? Limit intake if you are breastfeeding.  Be aware of how much alcohol is in your drink. In the [place] one drink equals one 12 oz bottle of beer (355 mL), one 5 oz glass of wine (148 mL), or one 1 oz glass of hard liquor (44 mL). General instructions  Schedule regular health, dental, and eye exams.  Stay current with your vaccines.  Tell your health care provider if: ? You often feel depressed. ? You have ever been abused or do not feel safe at home. Summary  Adopting a healthy lifestyle and getting preventive care are important in promoting health and wellness.  Follow your health care provider's instructions about healthy  diet, exercising, and getting tested or screened for diseases.  Follow your health care provider's instructions on monitoring your cholesterol and blood pressure. This information is not intended to replace advice given to you by your health care provider. Make sure you discuss any questions you have with your health care provider. Document Revised: 02/13/2018 Document Reviewed: 02/13/2018 Elsevier Patient Education  [place].    Managing Your Hypertension Hypertension is commonly called high blood pressure. This is when the force of your blood pressing against the walls of your arteries is too strong. Arteries are blood vessels that carry blood from your heart throughout your body. Hypertension forces the heart to work harder to pump blood, and may cause the arteries to become narrow or stiff. Having untreated or uncontrolled hypertension can cause heart attack, stroke, kidney disease, and other problems. What are blood pressure readings? A blood pressure reading consists of a higher number over a lower number. Ideally, your blood pressure should be below 120/80. The first ("top") number  is called the systolic pressure. It is a measure of the pressure in your arteries as your heart beats. The second ("bottom") number is called the diastolic pressure. It is a measure of the pressure in your arteries as the heart relaxes. What does my blood pressure reading mean? Blood pressure is classified into four stages. Based on your blood pressure reading, your health care provider may use the following stages to determine what type of treatment you need, if any. Systolic pressure and diastolic pressure are measured in a unit called mm Hg. Normal  Systolic pressure: below 841.  Diastolic pressure: below 80. Elevated  Systolic pressure: 324-401.  Diastolic pressure: below 80. Hypertension stage 1  Systolic pressure: 027-253.  Diastolic pressure: 66-44. Hypertension stage 2  Systolic  pressure: 034 or above.  Diastolic pressure: 90 or above. What health risks are associated with hypertension? Managing your hypertension is an important responsibility. Uncontrolled hypertension can lead to:  A heart attack.  A stroke.  A weakened blood vessel (aneurysm).  Heart failure.  Kidney damage.  Eye damage.  Metabolic syndrome.  Memory and concentration problems. What changes can I make to manage my hypertension? Hypertension can be managed by making lifestyle changes and possibly by taking medicines. Your health care provider will help you make a plan to bring your blood pressure within a normal range. Eating and drinking   Eat a diet that is high in fiber and potassium, and low in salt (sodium), added sugar, and fat. An example eating plan is called the DASH (Dietary Approaches to Stop Hypertension) diet. To eat this way: ? Eat plenty of fresh fruits and vegetables. Try to fill half of your plate at each meal with fruits and vegetables. ? Eat whole grains, such as whole wheat pasta, brown rice, or whole grain bread. Fill about one quarter of your plate with whole grains. ? Eat low-fat diary products. ? Avoid fatty cuts of meat, processed or cured meats, and poultry with skin. Fill about one quarter of your plate with lean proteins such as fish, chicken without skin, beans, eggs, and tofu. ? Avoid premade and processed foods. These tend to be higher in sodium, added sugar, and fat.  Reduce your daily sodium intake. Most people with hypertension should eat less than 1,500 mg of sodium a day.  Limit alcohol intake to no more than 1 drink a day for nonpregnant women and 2 drinks a day for men. One drink equals 12 oz of beer, 5 oz of wine, or 1 oz of hard liquor. Lifestyle  Work with your health care provider to maintain a healthy body weight, or to lose weight. Ask what an ideal weight is for you.  Get at least 30 minutes of exercise that causes your heart to beat  faster (aerobic exercise) most days of the week. Activities may include walking, swimming, or biking.  Include exercise to strengthen your muscles (resistance exercise), such as weight lifting, as part of your weekly exercise routine. Try to do these types of exercises for 30 minutes at least 3 days a week.  Do not use any products that contain nicotine or tobacco, such as cigarettes and e-cigarettes. If you need help quitting, ask your health care provider.  Control any long-term (chronic) conditions you have, such as high cholesterol or diabetes. Monitoring  Monitor your blood pressure at home as told by your health care provider. Your personal target blood pressure may vary depending on your medical conditions, your age, and other factors.  Have your blood pressure checked regularly, as often as told by your health care provider. Working with your health care provider  Review all the medicines you take with your health care provider because there may be side effects or interactions.  Talk with your health care provider about your diet, exercise habits, and other lifestyle factors that may be contributing to hypertension.  Visit your health care provider regularly. Your health care provider can help you create and adjust your plan for managing hypertension. Will I need medicine to control my blood pressure? Your health care provider may prescribe medicine if lifestyle changes are not enough to get your blood pressure under control, and if:  Your systolic blood pressure is 130 or higher.  Your diastolic blood pressure is 80 or higher. Take medicines only as told by your health care provider. Follow the directions carefully. Blood pressure medicines must be taken as prescribed. The medicine does not work as well when you skip doses. Skipping doses also puts you at risk for problems. Contact a health care provider if:  You think you are having a reaction to medicines you have taken.  You  have repeated (recurrent) headaches.  You feel dizzy.  You have swelling in your ankles.  You have trouble with your vision. Get help right away if:  You develop a severe headache or confusion.  You have unusual weakness or numbness, or you feel faint.  You have severe pain in your chest or abdomen.  You vomit repeatedly.  You have trouble breathing. Summary  Hypertension is when the force of blood pumping through your arteries is too strong. If this condition is not controlled, it may put you at risk for serious complications.  Your personal target blood pressure may vary depending on your medical conditions, your age, and other factors. For most people, a normal blood pressure is less than 120/80.  Hypertension is managed by lifestyle changes, medicines, or both. Lifestyle changes include weight loss, eating a healthy, low-sodium diet, exercising more, and limiting alcohol. This information is not intended to replace advice given to you by your health care provider. Make sure you discuss any questions you have with your health care provider. Document Revised: 06/14/2018 Document Reviewed: 01/19/2016 Elsevier Patient Education  [place].  Hypertension, Adult Hypertension is another name for high blood pressure. High blood pressure forces your heart to work harder to pump blood. This can cause problems over time. There are two numbers in a blood pressure reading. There is a top number (systolic) over a bottom number (diastolic). It is best to have a blood pressure that is below 120/80. Healthy choices can help lower your blood pressure, or you may need medicine to help lower it. What are the causes? The cause of this condition is not known. Some conditions may be related to high blood pressure. What increases the risk?  Smoking.  Having type 2 diabetes mellitus, high cholesterol, or both.  Not getting enough exercise or physical activity.  Being  overweight.  Having too much fat, sugar, calories, or salt (sodium) in your diet.  Drinking too much alcohol.  Having long-term (chronic) kidney disease.  Having a family history of high blood pressure.  Age. Risk increases with age.  Race. You may be at higher risk if you are African American.  Gender. Men are at higher risk than women before age 15. After age 57, women are at higher risk than men.  Having obstructive sleep apnea.  Stress. What are  the signs or symptoms?  High blood pressure may not cause symptoms. Very high blood pressure (hypertensive crisis) may cause: ? Headache. ? Feelings of worry or nervousness (anxiety). ? Shortness of breath. ? Nosebleed. ? A feeling of being sick to your stomach (nausea). ? Throwing up (vomiting). ? Changes in how you see. ? Very bad chest pain. ? Seizures. How is this treated?  This condition is treated by making healthy lifestyle changes, such as: ? Eating healthy foods. ? Exercising more. ? Drinking less alcohol.  Your health care provider may prescribe medicine if lifestyle changes are not enough to get your blood pressure under control, and if: ? Your top number is above 130. ? Your bottom number is above 80.  Your personal target blood pressure may vary. Follow these instructions at home: Eating and drinking   If told, follow the DASH eating plan. To follow this plan: ? Fill one half of your plate at each meal with fruits and vegetables. ? Fill one fourth of your plate at each meal with whole grains. Whole grains include whole-wheat pasta, brown rice, and whole-grain bread. ? Eat or drink low-fat dairy products, such as skim milk or low-fat yogurt. ? Fill one fourth of your plate at each meal with low-fat (lean) proteins. Low-fat proteins include fish, chicken without skin, eggs, beans, and tofu. ? Avoid fatty meat, cured and processed meat, or chicken with skin. ? Avoid pre-made or processed food.  Eat less  than 1,500 mg of salt each day.  Do not drink alcohol if: ? Your doctor tells you not to drink. ? You are pregnant, may be pregnant, or are planning to become pregnant.  If you drink alcohol: ? Limit how much you use to:  0-1 drink a day for women.  0-2 drinks a day for men. ? Be aware of how much alcohol is in your drink. In the [place] one drink equals one 12 oz bottle of beer (355 mL), one 5 oz glass of wine (148 mL), or one 1 oz glass of hard liquor (44 mL). Lifestyle   Work with your doctor to stay at a healthy weight or to lose weight. Ask your doctor what the best weight is for you.  Get at least 30 minutes of exercise most days of the week. This may include walking, swimming, or biking.  Get at least 30 minutes of exercise that strengthens your muscles (resistance exercise) at least 3 days a week. This may include lifting weights or doing Pilates.  Do not use any products that contain nicotine or tobacco, such as cigarettes, e-cigarettes, and chewing tobacco. If you need help quitting, ask your doctor.  Check your blood pressure at home as told by your doctor.  Keep all follow-up visits as told by your doctor. This is important. Medicines  Take over-the-counter and prescription medicines only as told by your doctor. Follow directions carefully.  Do not skip doses of blood pressure medicine. The medicine does not work as well if you skip doses. Skipping doses also puts you at risk for problems.  Ask your doctor about side effects or reactions to medicines that you should watch for. Contact a doctor if you:  Think you are having a reaction to the medicine you are taking.  Have headaches that keep coming back (recurring).  Feel dizzy.  Have swelling in your ankles.  Have trouble with your vision. Get help right away if you:  Get a very bad headache.  Start to feel  mixed up (confused).  Feel weak or numb.  Feel faint.  Have very bad pain in  your: ? Chest. ? Belly (abdomen).  Throw up more than once.  Have trouble breathing. Summary  Hypertension is another name for high blood pressure.  High blood pressure forces your heart to work harder to pump blood.  For most people, a normal blood pressure is less than 120/80.  Making healthy choices can help lower blood pressure. If your blood pressure does not get lower with healthy choices, you may need to take medicine. This information is not intended to replace advice given to you by your health care provider. Make sure you discuss any questions you have with your health care provider. Document Revised: 10/31/2017 Document Reviewed: 10/31/2017 Elsevier Patient Education  2020 [employer].

## 2019-12-03 NOTE — Telephone Encounter (Signed)
Patient made aware. She did have done.

## 2019-12-04 ENCOUNTER — Encounter: Payer: BC Managed Care – PPO | Admitting: Rehabilitative and Restorative Service Providers"

## 2019-12-04 DIAGNOSIS — J301 Allergic rhinitis due to pollen: Secondary | ICD-10-CM | POA: Diagnosis not present

## 2019-12-04 LAB — COMPLETE METABOLIC PANEL WITH GFR
AG Ratio: 1.5 (calc) (ref 1.0–2.5)
ALT: 13 U/L (ref 6–29)
AST: 13 U/L (ref 10–35)
Albumin: 4.3 g/dL (ref 3.6–5.1)
Alkaline phosphatase (APISO): 70 U/L (ref 31–125)
BUN: 15 mg/dL (ref 7–25)
CO2: 29 mmol/L (ref 20–32)
Calcium: 9.4 mg/dL (ref 8.6–10.2)
Chloride: 101 mmol/L (ref 98–110)
Creat: 0.89 mg/dL (ref 0.50–1.10)
GFR, Est African American: 88 mL/min/{1.73_m2} (ref >=60)
GFR, Est Non African American: 76 mL/min/{1.73_m2} (ref >=60)
Globulin: 2.8 g/dL (calc) (ref 1.9–3.7)
Glucose, Bld: 80 mg/dL (ref 65–99)
Potassium: 4.8 mmol/L (ref 3.5–5.3)
Sodium: 137 mmol/L (ref 135–146)
Total Bilirubin: 0.6 mg/dL (ref 0.2–1.2)
Total Protein: 7.1 g/dL (ref 6.1–8.1)

## 2019-12-04 LAB — CBC WITH DIFFERENTIAL/PLATELET
Absolute Monocytes: 770 cells/uL (ref 200–950)
Basophils Absolute: 104 cells/uL (ref 0–200)
Basophils Relative: 0.7 %
Eosinophils Absolute: 222 cells/uL (ref 15–500)
Eosinophils Relative: 1.5 %
HCT: 45.2 % — ABNORMAL HIGH (ref 35.0–45.0)
Hemoglobin: 15.2 g/dL (ref 11.7–15.5)
Lymphs Abs: 3907 cells/uL — ABNORMAL HIGH (ref 850–3900)
MCH: 30.5 pg (ref 27.0–33.0)
MCHC: 33.6 g/dL (ref 32.0–36.0)
MCV: 90.6 fL (ref 80.0–100.0)
MPV: 9.7 fL (ref 7.5–12.5)
Monocytes Relative: 5.2 %
Neutro Abs: 9798 cells/uL — ABNORMAL HIGH (ref 1500–7800)
Neutrophils Relative %: 66.2 %
Platelets: 292 10*3/uL (ref 140–400)
RBC: 4.99 10*6/uL (ref 3.80–5.10)
RDW: 12.6 % (ref 11.0–15.0)
Total Lymphocyte: 26.4 %
WBC: 14.8 10*3/uL — ABNORMAL HIGH (ref 3.8–10.8)

## 2019-12-04 LAB — TSH: TSH: 1.66 mIU/L

## 2019-12-04 LAB — LIPID PANEL W/REFLEX DIRECT LDL
Cholesterol: 222 mg/dL — ABNORMAL HIGH (ref <200)
HDL: 71 mg/dL (ref >=50)
LDL Cholesterol (Calc): 126 mg/dL (calc) — ABNORMAL HIGH
Non-HDL Cholesterol (Calc): 151 mg/dL (calc) — ABNORMAL HIGH (ref <130)
Total CHOL/HDL Ratio: 3.1 (calc) (ref <5.0)
Triglycerides: 142 mg/dL (ref <150)

## 2019-12-04 LAB — HEPATITIS C ANTIBODY
Hepatitis C Ab: NONREACTIVE
SIGNAL TO CUT-OFF: 0.01 (ref <1.00)

## 2019-12-05 NOTE — Progress Notes (Signed)
Elaine Murillo,   WBC is elevated because of recent steroid shot.  Kidney, liver, glucose look good.  HDL GREAT! LDL up some from 1 year ago but ok.  Thyroid perfect.

## 2019-12-08 DIAGNOSIS — Z01419 Encounter for gynecological examination (general) (routine) without abnormal findings: Secondary | ICD-10-CM | POA: Insufficient documentation

## 2019-12-09 DIAGNOSIS — J301 Allergic rhinitis due to pollen: Secondary | ICD-10-CM | POA: Diagnosis not present

## 2019-12-12 ENCOUNTER — Encounter: Payer: Self-pay | Admitting: Physical Therapy

## 2019-12-12 ENCOUNTER — Other Ambulatory Visit: Payer: Self-pay

## 2019-12-12 ENCOUNTER — Ambulatory Visit (INDEPENDENT_AMBULATORY_CARE_PROVIDER_SITE_OTHER): Payer: BC Managed Care – PPO | Admitting: Physical Therapy

## 2019-12-12 DIAGNOSIS — M25551 Pain in right hip: Secondary | ICD-10-CM

## 2019-12-12 DIAGNOSIS — R293 Abnormal posture: Secondary | ICD-10-CM | POA: Diagnosis not present

## 2019-12-12 DIAGNOSIS — R2689 Other abnormalities of gait and mobility: Secondary | ICD-10-CM | POA: Diagnosis not present

## 2019-12-12 DIAGNOSIS — R29898 Other symptoms and signs involving the musculoskeletal system: Secondary | ICD-10-CM | POA: Diagnosis not present

## 2019-12-12 NOTE — Therapy (Signed)
[place], [place], [address] Phone: [phone]   Fax:  [phone]  Physical Therapy Treatment  Patient Details  Name: Elaine Murillo MRN: [id number] Date of Birth: [birth year] Referring Provider (PT): Dr Luetta Nutting   Encounter Date: 12/12/2019   PT End of Session - 12/12/19 1615    Visit Number 12    Number of Visits 12    Date for PT Re-Evaluation 12/09/19    PT Start Time 8315    PT Stop Time 1317    PT Time Calculation (min) 46 min    Activity Tolerance Patient tolerated treatment well    Behavior During Therapy [facility] for tasks assessed/performed           Past Medical History:  Diagnosis Date  . Anxiety   . Uterine fibroids affecting pregnancy     Past Surgical History:  Procedure Laterality Date  . TONSILLECTOMY      There were no vitals filed for this visit.   Subjective Assessment - 12/12/19 1618    Subjective Pt reports that she went swimming this week.  She is able to complete free-style front stroke and breast stroke without any hip pain.  She states she can now go up stairs with greater ease. She did ok with her trip for wedding.  She voices frustration that she can't walk as fast as she had in past.   She gets occasional locking of Rt knee with prolonged walking.    Currently in Pain? No/denies    Pain Score 0-No pain           Pt seen for aquatic therapy today.  Treatment took place in water 3-4 ft in depth at [facility]. Temp of water was 87 deg.  Pt entered the pool independently via ramp  with bilat UE on rail, and exited the pool via steps with single hand rail.  Treatment:   Gait: forward, backward, and side stepping x 20 meters each, 2 sets.  VC to allow Rt hip/knee to flex instead of keeping it rigid. Trial of braiding 5 meters Rt/Lt (light increase in Rt ant hip- subsides with rest).    At pool edge with light UE support on gutter:  bilat heel  raises x 10, single leg x 8 reps (RLE fatigues quickly); calf stretch x 30 sec x 2 reps RLE, 1 rep LLE. Hip ext and abdct x 10 reps each leg.   SLS x 10 sec x 2 reps each leg. Squats x 10. Tandem stance x 20 sec.  Rt hip flexor stretch x 20 sec   Swimming strokes- to assess tolerance and form:  Freestyle x 20 meters,  Back stroke x 10 meters, breast stroke x 10 meters,  Side stroke x 5 meters each direction (challenging);  Elementary breast stroke x 5 meters.   Pt requires buoyancy for support and to offload joints with strengthening exercises. Viscosity of the water is needed for resistance of strengthening; water current perturbations provides challenge to standing balance unsupported, requiring increased core activation.     PT Long Term Goals - 12/12/19 1636      PT LONG TERM GOAL #1   Title Improve posture and alignment with patient to demonstrate upright posture with equal wt bearing with no pain    Time 6    Period Weeks    Status Partially Met      PT LONG TERM GOAL #2   Title AROM through the  trunk and bilat hips WFL's with no pain or limitations    Time 6    Period Weeks    Status On-going      PT LONG TERM GOAL #3   Title normal gait pattern for functional household distances with no pain    Time 6    Period Weeks    Status On-going      PT LONG TERM GOAL #4   Title independent in HEP    Time 6    Period Weeks    Status On-going      PT LONG TERM GOAL #5   Title improve FOTO To </= 37% limitation    Time 6    Period Weeks    Status On-going                 Plan - 12/12/19 1629    Clinical Impression Statement Pt initially demonstrating antalgic gait on pool deck with decreased Rt DF at heel strike, decreased Rt knee flexion during swing through, and decreased weight shift R.  Pt initially reporting slight increase in Rt hip pain with gait against resistance of water, with continued antalgic gait pattern.  With cues and increased time in chest deep water,  quality of gait improved and pain was eliminated. She tolerated all other exercises well, including swimming strokes.  Pt tearful at times regarding frustration of slow progress; given encouragement throughout session reminding her of her improvement since initiating treatment.    Rehab Potential Good    PT Frequency 2x / week    PT Duration 6 weeks    PT Treatment/Interventions Patient/family education;ADLs/Self Care Home Management;Aquatic Therapy;Cryotherapy;Electrical Stimulation;Iontophoresis 71m/ml Dexamethasone;Moist Heat;Ultrasound;Gait training;Stair training;Functional mobility training;Therapeutic activities;Therapeutic exercise;Balance training;Neuromuscular re-education;Manual techniques;Dry needling;Taping    PT Next Visit Plan End of POC - assess goals. FOTO   [facility]   Consulted and Agree with Plan of Care Patient           Patient will benefit from skilled therapeutic intervention in order to improve the following deficits and impairments:  Abnormal gait, Difficulty walking, Increased fascial restricitons, Decreased endurance, Decreased activity tolerance, Pain, Impaired flexibility, Improper body mechanics, Decreased mobility, Decreased strength, Postural dysfunction  Visit Diagnosis: Pain in right hip  Other symptoms and signs involving the musculoskeletal system  Abnormal posture  Other abnormalities of gait and mobility     Problem List Patient Active Problem List   Diagnosis Date Noted  . Thyroid disorder screen 12/08/2019  . Hip impingement syndrome, right 11/25/2019  . Right hip pain 09/04/2019  . Hand pain 09/04/2019  . IUD (intrauterine device) in place 05/27/2019  . Facial flushing 12/08/2018  . Abnormal RBC 12/08/2018  . ETD (Eustachian tube dysfunction), right 12/03/2018  . Large breasts 01/18/2018  . Dry skin 12/29/2016  . Itching 12/29/2016  . Elevated blood pressure reading 06/06/2016  . Allergic rhinitis with postnasal  drip 06/01/2016  . Migraine variant 11/16/2015  . Hallux valgus of left foot 02/08/2015  . Metatarsalgia of left foot 02/08/2015  . Class 2 obesity due to excess calories without serious comorbidity with body mass index (BMI) of 36.0 to 36.9 in adult 02/08/2015  . Right-sided low back pain with right-sided sciatica 02/16/2014  . Vitamin D insufficiency 02/03/2014  . Need for prophylactic vaccination and inoculation against influenza 11/21/2012  . WEIGHT GAIN 08/20/2008   JKerin Perna PTA 12/12/19 4:37 PM  [place] Phone: [phone]   Fax:  [phone]  Name: Elaine Murillo MRN: [id number] Date of Birth: Jun 13, 1970

## 2019-12-15 DIAGNOSIS — L811 Chloasma: Secondary | ICD-10-CM | POA: Diagnosis not present

## 2019-12-15 DIAGNOSIS — L719 Rosacea, unspecified: Secondary | ICD-10-CM | POA: Diagnosis not present

## 2019-12-15 DIAGNOSIS — L710 Perioral dermatitis: Secondary | ICD-10-CM | POA: Diagnosis not present

## 2019-12-15 DIAGNOSIS — D1801 Hemangioma of skin and subcutaneous tissue: Secondary | ICD-10-CM | POA: Diagnosis not present

## 2019-12-16 ENCOUNTER — Encounter: Payer: Self-pay | Admitting: Rehabilitative and Restorative Service Providers"

## 2019-12-16 ENCOUNTER — Other Ambulatory Visit: Payer: Self-pay

## 2019-12-16 ENCOUNTER — Ambulatory Visit (INDEPENDENT_AMBULATORY_CARE_PROVIDER_SITE_OTHER): Payer: BC Managed Care – PPO | Admitting: Rehabilitative and Restorative Service Providers"

## 2019-12-16 DIAGNOSIS — M25551 Pain in right hip: Secondary | ICD-10-CM | POA: Diagnosis not present

## 2019-12-16 DIAGNOSIS — R293 Abnormal posture: Secondary | ICD-10-CM | POA: Diagnosis not present

## 2019-12-16 DIAGNOSIS — J301 Allergic rhinitis due to pollen: Secondary | ICD-10-CM | POA: Diagnosis not present

## 2019-12-16 DIAGNOSIS — R2689 Other abnormalities of gait and mobility: Secondary | ICD-10-CM | POA: Diagnosis not present

## 2019-12-16 DIAGNOSIS — R29898 Other symptoms and signs involving the musculoskeletal system: Secondary | ICD-10-CM

## 2019-12-16 NOTE — Patient Instructions (Signed)
Access Code: [email or web address]: [email or web address]: 10/12/2021Prepared by: Cotina Freedman HoltExercises  Prone Press Up - 2 x daily - 7 x weekly - 1 sets - 10 reps - 2-3 sec hold  Prone Quadriceps Stretch with Strap - 2 x daily - 7 x weekly - 1 sets - 3 reps - 30 sec hold  Access Code: [email or web address]: [email or web address]: 10/12/2021Prepared by: Brysan Mcevoy HoltExercises  Prone Press Up - 2 x daily - 7 x weekly - 1 sets - 10 reps - 2-3 sec hold  Prone Quadriceps Stretch with Strap - 2 x daily - 7 x weekly - 1 sets - 3 reps - 30 sec hold  Standing Hip Extension with Counter Support - 2 x daily - 7 x weekly - 1-2 sets - 10 reps - 3-5 sec hold  Standing Hip Abduction with Counter Support - 2 x daily - 7 x weekly - 2-3 sets - 10 reps - 2-3 sec hold  Supine Transversus Abdominis Bracing with Pelvic Floor Contraction - 2 x daily - 7 x weekly - 1 sets - 10 reps - 10sec hold  Supine Scapular Protraction in Flexion with Dumbbells - 2 x daily - 7 x weekly - 1 sets - 10 reps - 2-3 sec hold  Warrior I - 2 x daily - 7 x weekly - 1 sets - 10 reps - 3 sec hold  Bridge - 2 x daily - 7 x weekly - 1-2 sets - 10 reps - 5 sec hold  Wall Quarter Squat - 2 x daily - 7 x weekly - 1-2 sets - 10 reps - 5-10 sec hold  Prone Hip Extension - 2 x daily - 7 x weekly - 1 sets - 10 reps - 3 sec hold  Plank on Counter - 2 x daily - 7 x weekly - 1 sets - 3 reps - 30-60 sec hold  Plank with Hip Extension on Counter - 2 x daily - 7 x weekly - 1 sets - 5-10 reps  Prone Gluteal Sets - 2 x daily - 7 x weekly - 1 sets - 10 reps - 10 sec hold  Prone Hip Extension - 2 x daily - 7 x weekly - 1 sets - 10 reps - 3 sec hold  Supine Bridge with Knee Extension and Pelvic Floor Contraction - 2 x daily - 7 x weekly - 1-2 sets - 10 reps - 5 sec hold  Prone Hip Extension on Swiss Ball - 2 x daily - 7 x weekly - 1 sets - 10 reps - 5-10sec hold  Single Leg Quarter Squat with Swiss Ball at [employer] - 2 x daily - 7 x weekly - 1 sets - 10  reps - 5-10 sec hold  Supine Piriformis Stretch with Leg Straight - 2 x daily - 7 x weekly - 1 sets - 3 reps - 30 sec hold  Single Leg Bridge - 2 x daily - 7 x weekly - 1 sets - 10 reps - 10 sec hold  Single Leg Bridge - 2 x daily - 7 x weekly - 1 sets - 10 reps - 10sec hold

## 2019-12-16 NOTE — Therapy (Signed)
[place], [place], [address] Phone: [phone]   Fax:  [phone]  Physical Therapy Treatment  Patient Details  Name: Elaine Murillo MRN: [id number] Date of Birth: Oct 21, 1970 Referring Provider (PT): Dr Luetta Nutting   Encounter Date: 12/16/2019   PT End of Session - 12/16/19 1026    Visit Number 13    Number of Visits 24    Date for PT Re-Evaluation 01/27/20    PT Start Time 1018    PT Stop Time 1109    PT Time Calculation (min) 51 min    Activity Tolerance Patient tolerated treatment well           Past Medical History:  Diagnosis Date   Anxiety    Uterine fibroids affecting pregnancy     Past Surgical History:  Procedure Laterality Date   TONSILLECTOMY      There were no vitals filed for this visit.       [facility] PT Assessment - 12/16/19 0001      Assessment   Medical Diagnosis Rt posterior lateral hip pain     Referring Provider (PT) Dr Luetta Nutting    Hand Dominance Right    Next MD Visit Dr T - 11/03/19    Prior Therapy none       Observation/Other Assessments   Focus on Therapeutic Outcomes (FOTO)  45% limitation       AROM   Lumbar Flexion 90%     Lumbar Extension 60% stretch anterior Rt hip     Lumbar - Right Side Bend 80%    Lumbar - Left Side Bend 75%    Lumbar - Right Rotation 55%    Lumbar - Left Rotation 55%      Flexibility   Hamstrings Rt 70 deg; Lt 70 deg     Quadriceps WFL's     ITB slightly tight Rt > Lt     Piriformis tight Rt > Lt       Palpation   Spinal mobility WFL's lumbar CPA mobs     Palpation comment significant decrease in muscular tightness through Rt iliopsoas; hip adductors; TFL/ITB; posterior hip through the gluts and piriformis                          [facility] Adult PT Treatment/Exercise - 12/16/19 0001      Lumbar Exercises: Stretches   Hip Flexor Stretch Right;Left;2 reps;30 seconds   seated & standing reaching  up overhead w/same side arm    Hip Flexor Stretch Limitations half kneeling arm over head 30-45 sec     Piriformis Stretch Right;2 reps;30 seconds   supine travell      Lumbar Exercises: Standing   Wall Slides 15 reps;5 seconds   w/swiss ball x5; Rt LE back x 5; lifting Lt LE x 5; 5-10sec   Other Standing Lumbar Exercises warrior pose with Rt arm overhead; heel lifts x 10 - 3 sets       Knee/Hip Exercises: Standing   Forward Step Up Right;10 reps;Step Height: 6";Hand Hold: 1    Forward Step Up Limitations retro step up with Rt LE down with Lt x 10 1 HH assist 6 inch step       Moist Heat Therapy   Number Minutes Moist Heat 10 Minutes    Moist Heat Location Hip;Lumbar Spine   Rt  PT Education - 12/16/19 1055    Education Details HEP    Person(s) Educated Patient    Methods Explanation;Demonstration;Tactile cues;Verbal cues;Handout    Comprehension Verbalized understanding;Returned demonstration;Verbal cues required;Tactile cues required               PT Long Term Goals - 12/16/19 1227      PT LONG TERM GOAL #1   Title Improve posture and alignment with patient to demonstrate upright posture with equal wt bearing with no pain    Time 6    Period Weeks    Status Achieved      PT LONG TERM GOAL #2   Title AROM through the trunk and bilat hips WFL's with no pain or limitations    Time 6    Period Weeks    Status Partially Met    Target Date 01/27/20      PT LONG TERM GOAL #3   Title normal gait pattern for functional household distances with no pain    Time 6    Period Weeks    Status Partially Met    Target Date 01/27/20      PT LONG TERM GOAL #4   Title independent in HEP    Time 6    Period Weeks    Status Partially Met    Target Date 01/27/20      PT LONG TERM GOAL #5   Title improve FOTO To </= 37% limitation    Time 6    Period Weeks    Status Partially Met    Target Date 01/27/20                 Plan - 12/16/19 1044     Clinical Impression Statement Patient reports improvement but continued limitations primarily related to endurance with walking. Trunk mobility and ROM is WFL's with some continued discomfort at end range flexion and rotation. patient has limited ORM and mobility through the Rt hip; persistent weakness Rt LE; tenderness to palpation Rt hip flexors. She will benefit from continued PT to progress with stretching and strengthening. She will continue with aquatic therapy and land based exercise. Patient will progress to community based aquatic program and work more consistently with HEP. Added variations for strengthening today.    Rehab Potential Good    PT Frequency 2x / week    PT Duration 6 weeks    PT Treatment/Interventions Patient/family education;ADLs/Self Care Home Management;Aquatic Therapy;Cryotherapy;Electrical Stimulation;Iontophoresis 4mg /ml Dexamethasone;Moist Heat;Ultrasound;Gait training;Stair training;Functional mobility training;Therapeutic activities;Therapeutic exercise;Balance training;Neuromuscular re-education;Manual techniques;Dry needling;Taping    PT Next Visit Plan continue with aquatic therapy ; stretching; strengthening; single leg work Rt; stairs; balance activities; modalities as indicated    PT Home Exercise Plan [email or web address]    Consulted and Agree with Plan of Care Patient           Patient will benefit from skilled therapeutic intervention in order to improve the following deficits and impairments:     Visit Diagnosis: Pain in right hip  Other symptoms and signs involving the musculoskeletal system  Abnormal posture  Other abnormalities of gait and mobility     Problem List Patient Active Problem List   Diagnosis Date Noted   Thyroid disorder screen 12/08/2019   Hip impingement syndrome, right 11/25/2019   Right hip pain 09/04/2019   Hand pain 09/04/2019   IUD (intrauterine device) in place 05/27/2019   Facial flushing 12/08/2018   Abnormal  RBC 12/08/2018   ETD (Eustachian tube dysfunction), right 12/03/2018  Large breasts 01/18/2018   Dry skin 12/29/2016   Itching 12/29/2016   Elevated blood pressure reading 06/06/2016   Allergic rhinitis with postnasal drip 06/01/2016   Migraine variant 11/16/2015   Hallux valgus of left foot 02/08/2015   Metatarsalgia of left foot 02/08/2015   Class 2 obesity due to excess calories without serious comorbidity with body mass index (BMI) of 36.0 to 36.9 in adult 02/08/2015   Right-sided low back pain with right-sided sciatica 02/16/2014   Vitamin D insufficiency 02/03/2014   Need for prophylactic vaccination and inoculation against influenza 11/21/2012   WEIGHT GAIN 08/20/2008    Elaine Murillo Nilda Simmer PT, MPH  12/16/2019, 12:29 PM  [facility] [place], [place], [address] Phone: [phone]   Fax:  [phone]  Name: Elaine Murillo MRN: [id number] Date of Birth: Apr 03, 1970

## 2019-12-18 ENCOUNTER — Other Ambulatory Visit: Payer: Self-pay | Admitting: Family Medicine

## 2019-12-18 NOTE — Addendum Note (Signed)
Addended by: Towana Badger on: 12/18/2019 01:37 PM   Modules accepted: Orders

## 2019-12-19 ENCOUNTER — Other Ambulatory Visit: Payer: Self-pay

## 2019-12-19 ENCOUNTER — Encounter: Payer: Self-pay | Admitting: Physical Therapy

## 2019-12-19 ENCOUNTER — Ambulatory Visit (INDEPENDENT_AMBULATORY_CARE_PROVIDER_SITE_OTHER): Payer: BC Managed Care – PPO | Admitting: Physical Therapy

## 2019-12-19 DIAGNOSIS — R2689 Other abnormalities of gait and mobility: Secondary | ICD-10-CM

## 2019-12-19 DIAGNOSIS — M25551 Pain in right hip: Secondary | ICD-10-CM

## 2019-12-19 DIAGNOSIS — R293 Abnormal posture: Secondary | ICD-10-CM

## 2019-12-19 DIAGNOSIS — R29898 Other symptoms and signs involving the musculoskeletal system: Secondary | ICD-10-CM | POA: Diagnosis not present

## 2019-12-19 NOTE — Therapy (Addendum)
Elaine Murillo [place], [place], [address] Phone: [phone]   Fax:  [phone]  Physical Therapy Treatment  Patient Details  Name: Elaine Murillo MRN: [id number] Date of Birth: Dec 30, 1970 Referring Provider (PT): Dr Luetta Nutting   Encounter Date: 12/19/2019   PT End of Session - 12/19/19 1553    Visit Number 14    Number of Visits 24    Date for PT Re-Evaluation 01/27/20    PT Start Time 1406    PT Stop Time 1454    PT Time Calculation (min) 48 min    Activity Tolerance Patient tolerated treatment well    Behavior During Therapy [facility] for tasks assessed/performed           Past Medical History:  Diagnosis Date  . Anxiety   . Uterine fibroids affecting pregnancy     Past Surgical History:  Procedure Laterality Date  . TONSILLECTOMY      There were no vitals filed for this visit.   Subjective Assessment - 12/19/19 1554    Subjective Pt reports that she has taken 2 aquatic exercise classes at the [facility] and they have gone well.  "It feels good".  She has slight pain in Rt hip and knee, but is unsure if this warrants a follow up with referring MD or not.  She is wondering how long it will be before she can to return to land based exercises (without pain or reinjury).    Currently in Pain? Yes    Pain Score 1     Pain Location Hip    Pain Orientation Right    Pain Descriptors / Indicators Aching    Aggravating Factors  prolonged walking, hip ER, prolonged sitting    Pain Relieving Factors hip flexor stretch, water exercise           Pt seen for aquatic therapy today.  Treatment took place in water 3-4 ft in depth at [facility]. Temp of water was 87 deg.  Pt entered/exited the pool via stairs with bilat hand rail with step-to pattern on descent, and reciprocal pattern on ascent.  Treatment:   Gait:  With floatation walker - 25 meters both forward and backward, Without any floating  assistance 25 meters forward and backward (improved stride length from last visit).  Side stepping Rt/Lt 25 meters. Braiding Rt/ Lt x 5 meters (some increase in pain- stopped).  At end of session Forward / backward walk 25 meters after completing stairs.     At wall with UE support:  bilat heel raises x 10, RLE heel raises x 10 (fatigues quickly).  Rt calf stretch x 15 sec x 2.  Rt SLS with horiz head turns x 30 sec.   Warrior 1 with Rt heel raises and arms overhead x 10. Hip ext x 10 each leg.  Rt quad stretch x 20 sec x 2.  Rt ITB stretch x 20 sec x 2.  Hip abdct x 10 each leg.   Squats pushing down floatation barbell x 12 reps.  Frog hops with sitting on pool noodle between legs x 20 meters.  Forward step ups/ retro step downs with BUE lightly on rail x 10 with cues for sequence and posture.  Flutter kick holding pool noodle with arms in forward flexion x 50 meters.   Reviewed current printed HEP and discussed frequency and exercise selection; pt verbalized understanding.   Pt requires buoyancy for support and to offload joints with strengthening  exercises. Viscosity of the water is needed for resistance of strengthening; water current perturbations provides challenge to standing balance while unsupported, requiring increased core activation.     PT Long Term Goals - 12/16/19 1227      PT LONG TERM GOAL #1   Title Improve posture and alignment with patient to demonstrate upright posture with equal wt bearing with no pain    Time 6    Period Weeks    Status Achieved      PT LONG TERM GOAL #2   Title AROM through the trunk and bilat hips WFL's with no pain or limitations    Time 6    Period Weeks    Status Partially Met    Target Date 01/27/20      PT LONG TERM GOAL #3   Title normal gait pattern for functional household distances with no pain    Time 6    Period Weeks    Status Partially Met    Target Date 01/27/20      PT LONG TERM GOAL #4   Title independent in HEP    Time  6    Period Weeks    Status Partially Met    Target Date 01/27/20      PT LONG TERM GOAL #5   Title improve FOTO To </= 37% limitation    Time 6    Period Weeks    Status Partially Met    Target Date 01/27/20                 Plan - 12/19/19 1557    Clinical Impression Statement Pt observed demonstrating improved gait pattern both on land and in water.  She fatigues quickly with Rt single leg heel raises, and has increased Rt hip pain with braiding, but otherwise all other exercises tolerated without increase in pain.  Land HEP exercises reviewed verbally (and visually with worksheets); pt verbalized understanding the importance of strengthening hip extensors in a variety of ways.  Pt verbalized interest to hold therapy, since she has had positive experience with aquatic exercises and has established HEP.  Will call within 30 days if hip pain worsens or she is in need of exercise progression.    Rehab Potential Good    PT Frequency 2x / week    PT Duration 6 weeks    PT Treatment/Interventions Patient/family education;ADLs/Self Care Home Management;Aquatic Therapy;Cryotherapy;Electrical Stimulation;Iontophoresis 4mg /ml Dexamethasone;Moist Heat;Ultrasound;Gait training;Stair training;Functional mobility training;Therapeutic activities;Therapeutic exercise;Balance training;Neuromuscular re-education;Manual techniques;Dry needling;Taping    PT Next Visit Plan hold therapy until 11/15; if pt doesn't return by then, will d/c.    PT Home Exercise Plan [facility]    Consulted and Agree with Plan of Care Patient           Patient will benefit from skilled therapeutic intervention in order to improve the following deficits and impairments:     Visit Diagnosis: Pain in right hip  Other symptoms and signs involving the musculoskeletal system  Abnormal posture  Other abnormalities of gait and mobility     Problem List Patient Active Problem List   Diagnosis Date Noted  . Thyroid  disorder screen 12/08/2019  . Hip impingement syndrome, right 11/25/2019  . Right hip pain 09/04/2019  . Hand pain 09/04/2019  . IUD (intrauterine device) in place 05/27/2019  . Facial flushing 12/08/2018  . Abnormal RBC 12/08/2018  . ETD (Eustachian tube dysfunction), right 12/03/2018  . Large breasts 01/18/2018  . Dry skin 12/29/2016  . Itching  12/29/2016  . Elevated blood pressure reading 06/06/2016  . Allergic rhinitis with postnasal drip 06/01/2016  . Migraine variant 11/16/2015  . Hallux valgus of left foot 02/08/2015  . Metatarsalgia of left foot 02/08/2015  . Class 2 obesity due to excess calories without serious comorbidity with body mass index (BMI) of 36.0 to 36.9 in adult 02/08/2015  . Right-sided low back pain with right-sided sciatica 02/16/2014  . Vitamin D insufficiency 02/03/2014  . Need for prophylactic vaccination and inoculation against influenza 11/21/2012  . WEIGHT GAIN 08/20/2008   Kerin Perna, PTA 12/19/19 4:14 PM  [facility] [place]  [place], [place], [address] Phone: [phone]   Fax:  [phone]  Name: Eva Griffo MRN: [id number] Date of Birth: [birth year]  PHYSICAL THERAPY DISCHARGE SUMMARY  Visits from Start of Care: 14  Current functional level related to goals / functional outcomes: See progress note for discharge status    Remaining deficits: Unknown    Education / Equipment: HEP  Plan: Patient agrees to discharge.  Patient goals were met. Patient is being discharged due to meeting the stated rehab goals.  ?????     Celyn P. Helene Kelp PT, MPH 04/26/20 12:31 PM

## 2019-12-22 ENCOUNTER — Encounter: Payer: Self-pay | Admitting: Gastroenterology

## 2019-12-23 ENCOUNTER — Ambulatory Visit (INDEPENDENT_AMBULATORY_CARE_PROVIDER_SITE_OTHER): Payer: BC Managed Care – PPO | Admitting: Sports Medicine

## 2019-12-23 DIAGNOSIS — M1611 Unilateral primary osteoarthritis, right hip: Secondary | ICD-10-CM

## 2019-12-23 DIAGNOSIS — J301 Allergic rhinitis due to pollen: Secondary | ICD-10-CM | POA: Diagnosis not present

## 2019-12-23 MED ORDER — ACETAMINOPHEN ER 650 MG PO TBCR: 650.0000 mg | EXTENDED_RELEASE_TABLET | Freq: Three times a day (TID) | ORAL | 3 refills | Status: DC | PRN

## 2019-12-23 NOTE — Progress Notes (Signed)
    Procedures performed today:    None.  Independent interpretation of notes and tests performed by another provider:   None.  Brief History, Exam, Impression, and Recommendations:    Primary osteoarthritis of right hip And returns, she is a pleasant 49 year old female with chronic right hip pain, we did an MR arthrogram that showed predominantly hip osteoarthritis. She had some improvement from the steroid and the arthrogram. She has been doing formal physical therapy, and she has stopped her analgesics. She is noticing intermittent pain on the lateral aspect of her hip, not present today, but she is also starting to note more pain in the groin. I think she is having recurrence of her primary pain generator. We will restart oral analgesics before considering additional intervention. Arthritis from Tylenol 2-3 times a day can be done long-term. She can live her life as tolerated. Return to see me on an as needed basis. Certainly if she develops more consistent pain laterally we can try a trochanteric bursa injection, if she develops more consistent pain anteriorly she will likely need to consider hip arthroplasty.    ___________________________________________ Gwen Her. Dianah Field, M.D., ABFM., CAQSM. Primary Care and [place] Instructor of [place] of [facility] [employer]

## 2019-12-23 NOTE — Assessment & Plan Note (Signed)
And returns, she is a pleasant 49 year old female with chronic right hip pain, we did an MR arthrogram that showed predominantly hip osteoarthritis. She had some improvement from the steroid and the arthrogram. She has been doing formal physical therapy, and she has stopped her analgesics. She is noticing intermittent pain on the lateral aspect of her hip, not present today, but she is also starting to note more pain in the groin. I think she is having recurrence of her primary pain generator. We will restart oral analgesics before considering additional intervention. Arthritis from Tylenol 2-3 times a day can be done long-term. She can live her life as tolerated. Return to see me on an as needed basis. Certainly if she develops more consistent pain laterally we can try a trochanteric bursa injection, if she develops more consistent pain anteriorly she will likely need to consider hip arthroplasty.

## 2019-12-26 ENCOUNTER — Encounter: Payer: BC Managed Care – PPO | Admitting: Physical Therapy

## 2019-12-30 DIAGNOSIS — J301 Allergic rhinitis due to pollen: Secondary | ICD-10-CM | POA: Diagnosis not present

## 2020-01-06 DIAGNOSIS — J301 Allergic rhinitis due to pollen: Secondary | ICD-10-CM | POA: Diagnosis not present

## 2020-01-13 DIAGNOSIS — J301 Allergic rhinitis due to pollen: Secondary | ICD-10-CM | POA: Diagnosis not present

## 2020-01-14 ENCOUNTER — Ambulatory Visit (AMBULATORY_SURGERY_CENTER): Payer: Self-pay

## 2020-01-14 ENCOUNTER — Other Ambulatory Visit: Payer: Self-pay

## 2020-01-14 VITALS — Ht 64.0 in | Wt 218.0 lb

## 2020-01-14 DIAGNOSIS — Z1211 Encounter for screening for malignant neoplasm of colon: Secondary | ICD-10-CM

## 2020-01-14 MED ORDER — PLENVU 140 G PO SOLR: 1.0000 | ORAL | 0 refills | Status: DC

## 2020-01-14 NOTE — Progress Notes (Signed)
No allergies to soy or egg Pt is not on blood thinners or diet pills Denies issues with sedation/intubation Denies atrial flutter/fib Denies constipation   Emmi instructions given to pt  Pt is aware of Covid safety and care partner requirements.  

## 2020-01-20 ENCOUNTER — Encounter: Payer: Self-pay | Admitting: Gastroenterology

## 2020-01-20 DIAGNOSIS — J301 Allergic rhinitis due to pollen: Secondary | ICD-10-CM | POA: Diagnosis not present

## 2020-01-23 ENCOUNTER — Telehealth: Payer: Self-pay | Admitting: Neurology

## 2020-01-23 ENCOUNTER — Telehealth: Payer: Self-pay | Admitting: Gastroenterology

## 2020-01-23 NOTE — Telephone Encounter (Signed)
Patient left vm stating [facility] needs prior auth on her bowel prep. LMOM letting patient know that this was written by Dr. Ardis Hughs with [facility] and she should call their office with issues.

## 2020-01-23 NOTE — Telephone Encounter (Signed)
Patient notified that we do not do Prior auth. I explained to the patient to use the coupon to help with the price.

## 2020-01-23 NOTE — Telephone Encounter (Signed)
Patient calling states the Plenvu prep medication requires a prior authorazation please advise

## 2020-01-26 ENCOUNTER — Telehealth: Payer: Self-pay | Admitting: Gastroenterology

## 2020-01-26 DIAGNOSIS — Z1211 Encounter for screening for malignant neoplasm of colon: Secondary | ICD-10-CM

## 2020-01-26 MED ORDER — PLENVU 140 G PO SOLR: 1.0000 | Freq: Once | ORAL | 0 refills | Status: AC

## 2020-01-26 NOTE — Telephone Encounter (Signed)
Per pt's request Plenvu rx sent to [facility]. Called pt, no answer left a message.

## 2020-01-27 DIAGNOSIS — J301 Allergic rhinitis due to pollen: Secondary | ICD-10-CM | POA: Diagnosis not present

## 2020-02-03 DIAGNOSIS — J301 Allergic rhinitis due to pollen: Secondary | ICD-10-CM | POA: Diagnosis not present

## 2020-02-10 ENCOUNTER — Encounter: Payer: Self-pay | Admitting: Gastroenterology

## 2020-02-10 ENCOUNTER — Other Ambulatory Visit: Payer: Self-pay

## 2020-02-10 ENCOUNTER — Ambulatory Visit (AMBULATORY_SURGERY_CENTER): Payer: BC Managed Care – PPO | Admitting: Gastroenterology

## 2020-02-10 VITALS — BP 130/85 | HR 71 | Temp 96.2°F | Resp 15 | Ht 64.0 in | Wt 218.0 lb

## 2020-02-10 DIAGNOSIS — Z1211 Encounter for screening for malignant neoplasm of colon: Secondary | ICD-10-CM

## 2020-02-10 MED ORDER — SODIUM CHLORIDE 0.9 % IV SOLN: 500.0000 mL | INTRAVENOUS | Status: DC

## 2020-02-10 NOTE — Patient Instructions (Signed)
Normal colonoscopy!!!  Next colonoscopy in 10 years  Continue your normal medications  YOU HAD AN ENDOSCOPIC PROCEDURE TODAY AT [facility]:   Refer to the procedure report that was given to you for any specific questions about what was found during the examination.  If the procedure report does not answer your questions, please call your gastroenterologist to clarify.  If you requested that your care partner not be given the details of your procedure findings, then the procedure report has been included in a sealed envelope for you to review at your convenience later.  YOU SHOULD EXPECT: Some feelings of bloating in the abdomen. Passage of more gas than usual.  Walking can help get rid of the air that was put into your GI tract during the procedure and reduce the bloating. If you had a lower endoscopy (such as a colonoscopy or flexible sigmoidoscopy) you may notice spotting of blood in your stool or on the toilet paper. If you underwent a bowel prep for your procedure, you may not have a normal bowel movement for a few days.  Please Note:  You might notice some irritation and congestion in your nose or some drainage.  This is from the oxygen used during your procedure.  There is no need for concern and it should clear up in a day or so.  SYMPTOMS TO REPORT IMMEDIATELY:   Following lower endoscopy (colonoscopy or flexible sigmoidoscopy):  Excessive amounts of blood in the stool  Significant tenderness or worsening of abdominal pains  Swelling of the abdomen that is new, acute  Fever of 100F or higher  For urgent or emergent issues, a gastroenterologist can be reached at any hour by calling [phone]. Do not use MyChart messaging for urgent concerns.    DIET:  We do recommend a small meal at first, but then you may proceed to your regular diet.  Drink plenty of fluids but you should avoid alcoholic beverages for 24 hours.  ACTIVITY:  You should plan to take it easy  for the rest of today and you should NOT DRIVE or use heavy machinery until tomorrow (because of the sedation medicines used during the test).    FOLLOW UP: Our staff will call the number listed on your records 48-72 hours following your procedure to check on you and address any questions or concerns that you may have regarding the information given to you following your procedure. If we do not reach you, we will leave a message.  We will attempt to reach you two times.  During this call, we will ask if you have developed any symptoms of COVID 19. If you develop any symptoms (ie: fever, flu-like symptoms, shortness of breath, cough etc.) before then, please call [phone].  If you test positive for Covid 19 in the 2 weeks post procedure, please call and report this information to [place].     SIGNATURES/CONFIDENTIALITY: You and/or your care partner have signed paperwork which will be entered into your electronic medical record.  These signatures attest to the fact that that the information above on your After Visit Summary has been reviewed and is understood.  Full responsibility of the confidentiality of this discharge information lies with you and/or your care-partner.

## 2020-02-10 NOTE — Progress Notes (Signed)
Pt's states no medical or surgical changes since previsit or office visit. VS by CW. 

## 2020-02-10 NOTE — Op Note (Signed)
[place] Patient Name: Elaine Murillo Procedure Date: 02/10/2020 9:03 AM MRN: [id number] Endoscopist: Milus Banister , MD Age: 49 Referring MD:  Date of Birth: Jul 28, 1970 Gender: Female Account #: [id number] Procedure:                Colonoscopy Indications:              Screening for colorectal malignant neoplasm Medicines:                Monitored Anesthesia Care Procedure:                Pre-Anesthesia Assessment:                           - Prior to the procedure, a History and Physical                            was performed, and patient medications and                            allergies were reviewed. The patient's tolerance of                            previous anesthesia was also reviewed. The risks                            and benefits of the procedure and the sedation                            options and risks were discussed with the patient.                            All questions were answered, and informed consent                            was obtained. Prior Anticoagulants: The patient has                            taken no previous anticoagulant or antiplatelet                            agents. ASA Grade Assessment: II - A patient with                            mild systemic disease. After reviewing the risks                            and benefits, the patient was deemed in                            satisfactory condition to undergo the procedure.                           After obtaining informed consent, the colonoscope  was passed under direct vision. Throughout the                            procedure, the patient's blood pressure, pulse, and                            oxygen saturations were monitored continuously. The                            Colonoscope was introduced through the anus and                            advanced to the the cecum, identified by                            appendiceal orifice and  ileocecal valve. The                            colonoscopy was performed without difficulty. The                            patient tolerated the procedure well. The quality                            of the bowel preparation was good. The ileocecal                            valve, appendiceal orifice, and rectum were                            photographed. Scope In: 9:12:37 AM Scope Out: 9:22:50 AM Scope Withdrawal Time: 0 hours 7 minutes 49 seconds  Total Procedure Duration: 0 hours 10 minutes 13 seconds  Findings:                 The entire examined colon appeared normal on direct                            and retroflexion views. Complications:            No immediate complications. Estimated blood loss:                            None. Estimated Blood Loss:     Estimated blood loss: none. Impression:               - The entire examined colon is normal on direct and                            retroflexion views.                           - No polyps or cancers. Recommendation:           - Patient has a contact number available for  emergencies. The signs and symptoms of potential                            delayed complications were discussed with the                            patient. Return to normal activities tomorrow.                            Written discharge instructions were provided to the                            patient.                           - Resume previous diet.                           - Continue present medications.                           - Repeat colonoscopy in 10 years for screening. Milus Banister, MD 02/10/2020 9:25:03 AM This report has been signed electronically.

## 2020-02-10 NOTE — Progress Notes (Signed)
Report given to PACU, vss 

## 2020-02-12 ENCOUNTER — Telehealth: Payer: Self-pay

## 2020-02-12 DIAGNOSIS — J301 Allergic rhinitis due to pollen: Secondary | ICD-10-CM | POA: Diagnosis not present

## 2020-02-12 NOTE — Telephone Encounter (Signed)
Attempted to reach pt with follow-up call following endoscopic procedure 02/10/2020.  Attempted to LM on pt. Voice mail, message was cut off by machine.

## 2020-02-12 NOTE — Telephone Encounter (Signed)
LVM

## 2020-02-23 DIAGNOSIS — J301 Allergic rhinitis due to pollen: Secondary | ICD-10-CM | POA: Diagnosis not present

## 2020-03-10 DIAGNOSIS — J301 Allergic rhinitis due to pollen: Secondary | ICD-10-CM | POA: Diagnosis not present

## 2020-03-11 ENCOUNTER — Ambulatory Visit (INDEPENDENT_AMBULATORY_CARE_PROVIDER_SITE_OTHER): Payer: BC Managed Care – PPO | Admitting: Sports Medicine

## 2020-03-11 ENCOUNTER — Other Ambulatory Visit: Payer: Self-pay

## 2020-03-11 DIAGNOSIS — M1611 Unilateral primary osteoarthritis, right hip: Secondary | ICD-10-CM

## 2020-03-11 DIAGNOSIS — M7742 Metatarsalgia, left foot: Secondary | ICD-10-CM | POA: Diagnosis not present

## 2020-03-11 MED ORDER — DICLOFENAC SODIUM 75 MG PO TBEC: 75.0000 mg | DELAYED_RELEASE_TABLET | Freq: Two times a day (BID) | ORAL | 3 refills | Status: AC

## 2020-03-11 NOTE — Progress Notes (Signed)
    Procedures performed today:    None.  Independent interpretation of notes and tests performed by another provider:   None.  Brief History, Exam, Impression, and Recommendations:    Primary osteoarthritis of right hip Elaine Murillo returns, she is a pleasant 50 year old female, she has MR arthrogram confirmed hip osteoarthritis that responded really well to an injection back in September. She also does well with diclofenac, refilling this today per her report. Pain is somewhat anterolateral, so she does not respond to diclofenac over the next several weeks we will consider injection based on the predominant pain generator. We also discussed aggressive weight loss, as well as gastric sleeve. She will think about it.  Metatarsalgia of left foot Minimal metatarsalgia and clawing of the second toe. I would like her to try some custom molded orthotics with Dr. Jordan Murillo with a metatarsal pad.    ___________________________________________ Elaine Murillo. Elaine Murillo, M.D., ABFM., CAQSM. Primary Care and Sports Medicine [facility]  Adjunct Instructor of Family Medicine  [employer]

## 2020-03-11 NOTE — Assessment & Plan Note (Signed)
[place] returns, she is a pleasant 50 year old female, she has MR arthrogram confirmed hip osteoarthritis that responded really well to an injection back in September. She also does well with diclofenac, refilling this today per her report. Pain is somewhat anterolateral, so she does not respond to diclofenac over the next several weeks we will consider injection based on the predominant pain generator. We also discussed aggressive weight loss, as well as gastric sleeve. She will think about it.

## 2020-03-11 NOTE — Assessment & Plan Note (Signed)
Minimal metatarsalgia and clawing of the second toe. I would like her to try some custom molded orthotics with Dr. Jordan Likes with a metatarsal pad.

## 2020-03-15 DIAGNOSIS — J301 Allergic rhinitis due to pollen: Secondary | ICD-10-CM | POA: Diagnosis not present

## 2020-03-17 ENCOUNTER — Ambulatory Visit (INDEPENDENT_AMBULATORY_CARE_PROVIDER_SITE_OTHER): Payer: BC Managed Care – PPO | Admitting: Family Medicine

## 2020-03-17 ENCOUNTER — Ambulatory Visit: Payer: Self-pay

## 2020-03-17 ENCOUNTER — Other Ambulatory Visit: Payer: Self-pay

## 2020-03-17 DIAGNOSIS — M79675 Pain in left toe(s): Secondary | ICD-10-CM

## 2020-03-17 DIAGNOSIS — M2012 Hallux valgus (acquired), left foot: Secondary | ICD-10-CM

## 2020-03-17 NOTE — Assessment & Plan Note (Signed)
There is a small cystic change that seems to be emanating from the joint itself.  Does have hammering of the second digit on the left.  Likely get improvement with changes made today. -Green sport insoles. - Could consider custom orthotics going forward.

## 2020-03-17 NOTE — Progress Notes (Signed)
Elaine Murillo - 50 y.o. female MRN [id number]  Date of birth: 12/14/1970  SUBJECTIVE:  Including CC & ROS.  No chief complaint on file.   Elaine Murillo is a 50 y.o. female that is presenting with left foot pain and left toe pain.  Her symptoms are acute on chronic.  She had a change to the left toe that has been produced recently.  She also had some swelling of the left toe.  She has an ongoing hip issue as well.   Review of Systems See HPI   HISTORY: Past Medical, Surgical, Social, and Family History Reviewed & Updated per EMR.   Pertinent Historical Findings include:  Past Medical History:  Diagnosis Date  . Allergy    seasonal  . Anxiety   . Arthritis   . Hypertension   . Uterine fibroids affecting pregnancy     Past Surgical History:  Procedure Laterality Date  . TONSILLECTOMY      Family History  Problem Relation Age of Onset  . Stroke Paternal Grandmother   . Colon cancer Neg Hx   . Colon polyps Neg Hx   . Esophageal cancer Neg Hx   . Rectal cancer Neg Hx   . Stomach cancer Neg Hx     Social History   Socioeconomic History  . Marital status: Married    Spouse name: Not on file  . Number of children: Not on file  . Years of education: Not on file  . Highest education level: Not on file  Occupational History  . Occupation: homemaker  Tobacco Use  . Smoking status: Never Smoker  . Smokeless tobacco: Never Used  Vaping Use  . Vaping Use: Never used  Substance and Sexual Activity  . Alcohol use: No  . Drug use: No  . Sexual activity: Yes    Partners: Male    Birth control/protection: I.U.D.  Other Topics Concern  . Not on file  Social History Narrative  . Not on file   Social Determinants of Health   Financial Resource Strain: Not on file  Food Insecurity: Not on file  Transportation Needs: Not on file  Physical Activity: Not on file  Stress: Not on file  Social Connections: Not on file  Intimate Partner Violence: Not on file     PHYSICAL  EXAM:  VS: There were no vitals taken for this visit. Physical Exam Gen: NAD, alert, cooperative with exam, well-appearing MSK:  Left foot: Hallux valgus. Middle phalanx of the left toe shows a pronounced bump. No redness or swelling. Normal range of motion. Loss of transverse arch on the left. Has pes planus. Neurovascular intact  Limited ultrasound: Left second toe:  Second MTP joint with mild effusion. No changes in the proximal or middle phalanx. There is a small cystic change over the fibular aspect of the PIP joint.  No hyperemia  Summary: Small cystic change next of the PIP joint.  Ultrasound and interpretation by Clearance Coots, MD      ASSESSMENT & PLAN:   Hallux valgus of left foot Pes planus and bunion more severe on the left than the right. -Green sport insoles. -Dancer pad on left and metatarsal pad on right - small scaphoid pad placed bilaterally. -Could consider custom orthotics.   Pain in left toe(s) There is a small cystic change that seems to be emanating from the joint itself.  Does have hammering of the second digit on the left.  Likely get improvement with changes made today. -Green sport insoles. -  Could consider custom orthotics going forward.

## 2020-03-17 NOTE — Patient Instructions (Signed)
Nice to meet you Please let us know if you would want to try a custom orthotic   Please send me a message in [place] with any questions or updates.  Please see [place] back as needed.   --Dr. Raeford Razor

## 2020-03-17 NOTE — Assessment & Plan Note (Signed)
Pes planus and bunion more severe on the left than the right. -Green sport insoles. -Dancer pad on left and metatarsal pad on right - small scaphoid pad placed bilaterally. -Could consider custom orthotics.

## 2020-03-25 DIAGNOSIS — J301 Allergic rhinitis due to pollen: Secondary | ICD-10-CM | POA: Diagnosis not present

## 2020-04-01 ENCOUNTER — Ambulatory Visit: Payer: BC Managed Care – PPO

## 2020-04-06 DIAGNOSIS — J301 Allergic rhinitis due to pollen: Secondary | ICD-10-CM | POA: Diagnosis not present

## 2020-04-08 ENCOUNTER — Other Ambulatory Visit: Payer: Self-pay

## 2020-04-08 ENCOUNTER — Ambulatory Visit (INDEPENDENT_AMBULATORY_CARE_PROVIDER_SITE_OTHER): Payer: BC Managed Care – PPO

## 2020-04-08 DIAGNOSIS — Z1231 Encounter for screening mammogram for malignant neoplasm of breast: Secondary | ICD-10-CM | POA: Diagnosis not present

## 2020-04-08 DIAGNOSIS — Z01419 Encounter for gynecological examination (general) (routine) without abnormal findings: Secondary | ICD-10-CM

## 2020-04-14 ENCOUNTER — Ambulatory Visit (INDEPENDENT_AMBULATORY_CARE_PROVIDER_SITE_OTHER): Payer: BC Managed Care – PPO | Admitting: Sports Medicine

## 2020-04-14 ENCOUNTER — Ambulatory Visit (INDEPENDENT_AMBULATORY_CARE_PROVIDER_SITE_OTHER): Payer: BC Managed Care – PPO

## 2020-04-14 ENCOUNTER — Other Ambulatory Visit: Payer: Self-pay

## 2020-04-14 DIAGNOSIS — M1611 Unilateral primary osteoarthritis, right hip: Secondary | ICD-10-CM | POA: Diagnosis not present

## 2020-04-14 DIAGNOSIS — M2540 Effusion, unspecified joint: Secondary | ICD-10-CM | POA: Diagnosis not present

## 2020-04-14 MED ORDER — TRIAZOLAM 0.25 MG PO TABS: ORAL_TABLET | ORAL | 0 refills | Status: DC

## 2020-04-14 NOTE — Assessment & Plan Note (Addendum)
And returns, she is a very pleasant 50 year old female, MR arthrogram confirmed hip osteoarthritis, she did well with an injection back in September or so, diclofenac is also helpful. We also discussed aggressive weight loss as well as gastric sleeve as she has a very high BMI (she was wondering what caused her hip arthritis). I repeated her injection today, we did an aspiration as well, as I did see a hip joint effusion, this will be sent off for cultures, cell counts, and crystal analysis. She was very anxious and was shaking through the entire procedure, I am going to add some triazolam for her to take prior to any further injections, she will have a driver. Return to see me as needed.

## 2020-04-14 NOTE — Progress Notes (Signed)
    Procedures performed today:    Procedure: Real-time Ultrasound Guided  aspiration/injection of right hip joint Device: Samsung HS60  Verbal informed consent obtained.  Time-out conducted.  Noted no overlying erythema, induration, or other signs of local infection.  Skin prepped in a sterile fashion.  Local anesthesia: Topical Ethyl chloride.  With sterile technique and under real time ultrasound guidance:  Noted effusion, aspirated approximately 3 mL of slightly cloudy yellowish fluid, syringe switched and 1 cc Kenalog 40, 2 cc lidocaine, 2 cc bupivacaine injected easily Completed without difficulty  Advised to call if fevers/chills, erythema, induration, drainage, or persistent bleeding.  Images permanently stored and available for review in PACS.  Impression: Technically successful ultrasound guided injection.  Independent interpretation of notes and tests performed by another provider:   None.  Brief History, Exam, Impression, and Recommendations:    Primary osteoarthritis of right hip And returns, she is a very pleasant 50 year old female, MR arthrogram confirmed hip osteoarthritis, she did well with an injection back in September or so, diclofenac is also helpful. We also discussed aggressive weight loss as well as gastric sleeve as she has a very high BMI (she was wondering what caused her hip arthritis). I repeated her injection today, we did an aspiration as well, as I did see a hip joint effusion, this will be sent off for cultures, cell counts, and crystal analysis. She was very anxious and was shaking through the entire procedure, I am going to add some triazolam for her to take prior to any further injections, she will have a driver. Return to see me as needed.    ___________________________________________ Gwen Her. Dianah Field, M.D., ABFM., CAQSM. Primary Care and [place] Instructor of [place] [employer] [facility] [employer]

## 2020-04-15 LAB — CELL COUNT AND DIFF, FLUID, OTHER
Basophils, %: 0 %
Eosinophils, %: 0 %
Lymphocytes, %: 62 %
Mesothelial, %: 0 %
Monocyte/Macrophage %: 34 %
Neutrophils, %: 4 %
Total Nucleated Cell Ct: 594 cells/uL

## 2020-04-15 LAB — SYNOVIAL FLUID, CRYSTAL

## 2020-04-20 DIAGNOSIS — J301 Allergic rhinitis due to pollen: Secondary | ICD-10-CM | POA: Diagnosis not present

## 2020-05-04 DIAGNOSIS — J301 Allergic rhinitis due to pollen: Secondary | ICD-10-CM | POA: Diagnosis not present

## 2020-05-13 LAB — ANAEROBIC AND AEROBIC CULTURE
AER RESULT:: NO GROWTH
MICRO NUMBER:: 11517000
MICRO NUMBER:: 11517001
SPECIMEN QUALITY:: ADEQUATE
SPECIMEN QUALITY:: ADEQUATE

## 2020-05-18 DIAGNOSIS — J301 Allergic rhinitis due to pollen: Secondary | ICD-10-CM | POA: Diagnosis not present

## 2020-06-01 ENCOUNTER — Ambulatory Visit (INDEPENDENT_AMBULATORY_CARE_PROVIDER_SITE_OTHER): Payer: BC Managed Care – PPO | Admitting: Obstetrics and Gynecology

## 2020-06-01 ENCOUNTER — Encounter: Payer: Self-pay | Admitting: Obstetrics and Gynecology

## 2020-06-01 ENCOUNTER — Other Ambulatory Visit: Payer: Self-pay

## 2020-06-01 VITALS — BP 138/87 | HR 83 | Resp 16 | Ht 64.0 in | Wt 218.0 lb

## 2020-06-01 DIAGNOSIS — E669 Obesity, unspecified: Secondary | ICD-10-CM

## 2020-06-01 DIAGNOSIS — Z01419 Encounter for gynecological examination (general) (routine) without abnormal findings: Secondary | ICD-10-CM | POA: Diagnosis not present

## 2020-06-01 NOTE — Progress Notes (Signed)
GYNECOLOGY ANNUAL PREVENTATIVE CARE ENCOUNTER NOTE  History:     Elaine Murillo is a 50 y.o. G20P2002 female here for a routine annual gynecologic exam.  Current complaints: inability to lose weight, has PCP.   Denies abnormal vaginal bleeding, discharge, pelvic pain, problems with intercourse or other gynecologic concerns.  Had one episode of bleeding January, none since. Unsure of premenopausal vs. IUD.    Gynecologic History No LMP recorded. (Menstrual status: IUD). Contraception: IUD Last Pap: 2021. Results were: normal with negative HPV Last mammogram: 04/08/20. Results were: normal  Obstetric History OB History  Gravida Para Term Preterm AB Living  2 2 2     2   SAB IAB Ectopic Multiple Live Births          2    # Outcome Date GA Lbr Len/2nd Weight Sex Delivery Anes PTL Lv  2 Term 06/23/12 [redacted]w[redacted]d 01:48 / 00:04 9 lb 6 oz (4.252 kg) M Vag-Spont None  LIV  1 Term 10/22/07 [redacted]w[redacted]d  7 lb 1 oz (3.204 kg) F Vag-Spont EPI N LIV    Past Medical History:  Diagnosis Date  . Allergy    seasonal  . Anxiety   . Arthritis   . Hypertension   . Uterine fibroids affecting pregnancy     Past Surgical History:  Procedure Laterality Date  . TONSILLECTOMY      Current Outpatient Medications on File Prior to Visit  Medication Sig Dispense Refill  . acetaminophen (TYLENOL) 650 MG CR tablet Take 1 tablet (650 mg total) by mouth every 8 (eight) hours as needed for pain. 90 tablet 3  . cetirizine (ZYRTEC) 10 MG tablet Take 10 mg by mouth daily.    . diclofenac (VOLTAREN) 75 MG EC tablet Take 1 tablet (75 mg total) by mouth 2 (two) times daily. 180 tablet 3  . levonorgestrel (LILETTA, 52 MG,) 19.5 MCG/DAY IUD IUD 1 each by Intrauterine route once.    . Omega-3 Fatty Acids (FISH OIL) 1000 MG CAPS Take 4,000 mg by mouth daily.    . SOOLANTRA 1 % CREA Apply topically.     No current facility-administered medications on file prior to visit.    No Known Allergies  Social History:  reports  that she has never smoked. She has never used smokeless tobacco. She reports that she does not drink alcohol and does not use drugs.  Family History  Problem Relation Age of Onset  . Stroke Paternal Grandmother   . Colon cancer Neg Hx   . Colon polyps Neg Hx   . Esophageal cancer Neg Hx   . Rectal cancer Neg Hx   . Stomach cancer Neg Hx     The following portions of the patient's history were reviewed and updated as appropriate: allergies, current medications, past family history, past medical history, past social history, past surgical history and problem list.  Review of Systems Pertinent items noted in HPI and remainder of comprehensive ROS otherwise negative.  Physical Exam:  BP 138/87   Pulse 83   Resp 16   Ht 5\' 4"  (1.626 m)   Wt 218 lb (98.9 kg)   BMI 37.42 kg/m  CONSTITUTIONAL: Well-developed, well-nourished female in no acute distress.  HENT:  Normocephalic, atraumatic, External right and left ear normal.  EYES: Conjunctivae and EOM are normal. Pupils are equal, round, and reactive to light. No scleral icterus.  NECK: Normal range of motion, supple, no masses.  Normal thyroid.  SKIN: Skin is warm and dry. No  rash noted. Not diaphoretic. No erythema. No pallor. MUSCULOSKELETAL: Normal range of motion. No tenderness.  No cyanosis, clubbing, or edema. NEUROLOGIC: Alert and oriented to person, place, and time. Normal reflexes, muscle tone coordination.  PSYCHIATRIC: Normal mood and affect. Normal behavior. Normal judgment and thought content. CARDIOVASCULAR: Normal heart rate noted, regular rhythm RESPIRATORY: Clear to auscultation bilaterally. Effort and breath sounds normal, no problems with respiration noted. BREASTS: Symmetric in size. No masses, tenderness, skin changes, nipple drainage, or lymphadenopathy bilaterally. Performed in the presence of a chaperone. ABDOMEN: Soft, no distention noted.  No tenderness, rebound or guarding.  PELVIC: Normal appearing external  genitalia . No abnormal discharge noted. Enlarged uterine size, no other palpable masses, no uterine or adnexal tenderness.  Performed in the presence of a chaperone.  Assessment and Plan:   1. Obesity (BMI 30-39.9)  - Amb Ref to Medical Weight Management  2. Women's annual routine gynecological examination  Pap not due today Mammogram completed 2022 Return in 1 year for annual.     Routine preventative health maintenance measures emphasized. Please refer to After Visit Summary for other counseling recommendations.    Elaine Murillo, Elaine Murillo, Elaine Murillo for [name], [name]

## 2020-06-03 DIAGNOSIS — J301 Allergic rhinitis due to pollen: Secondary | ICD-10-CM | POA: Diagnosis not present

## 2020-06-15 DIAGNOSIS — J301 Allergic rhinitis due to pollen: Secondary | ICD-10-CM | POA: Diagnosis not present

## 2020-06-29 DIAGNOSIS — J301 Allergic rhinitis due to pollen: Secondary | ICD-10-CM | POA: Diagnosis not present

## 2020-07-13 DIAGNOSIS — J301 Allergic rhinitis due to pollen: Secondary | ICD-10-CM | POA: Diagnosis not present

## 2020-07-15 DIAGNOSIS — J301 Allergic rhinitis due to pollen: Secondary | ICD-10-CM | POA: Diagnosis not present

## 2020-07-27 DIAGNOSIS — J301 Allergic rhinitis due to pollen: Secondary | ICD-10-CM | POA: Diagnosis not present

## 2020-08-10 DIAGNOSIS — J301 Allergic rhinitis due to pollen: Secondary | ICD-10-CM | POA: Diagnosis not present

## 2020-08-18 DIAGNOSIS — Z20822 Contact with and (suspected) exposure to covid-19: Secondary | ICD-10-CM | POA: Diagnosis not present

## 2020-08-30 DIAGNOSIS — J301 Allergic rhinitis due to pollen: Secondary | ICD-10-CM | POA: Diagnosis not present

## 2020-09-25 IMAGING — MR MR HIP*R* W/CM
6 series · 40 of 40 positions shown · IV contrast (gadavist)
Comparison: Radiograph 09/04/2019

CLINICAL DATA: Right hip pain and cramping for 4 months. No acute
injury or prior relevant surgery. Evaluate for labral tear and
impingement

EXAM:
MRI OF THE RIGHT HIP WITH CONTRAST
TECHNIQUE: Multiplanar, multisequence MR imaging was performed following the
administration of intravenous contrast.
CONTRAST:  1mL GADAVIST GADOBUTROL 1 MMOL/ML IV SOLN

[Series 5: T2 fat-sat · coronal · 4.0mm · 1.33mm/px · 8 of 41 slices shown]
[im 1/41]
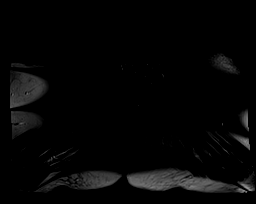
[im 6/41]
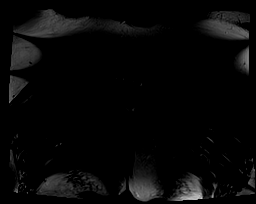
[im 12/41]
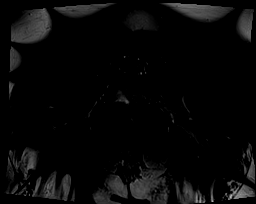
[im 18/41]
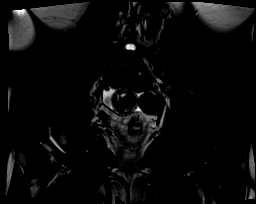
[im 23/41]
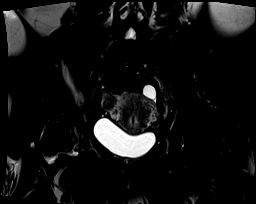
[im 29/41]
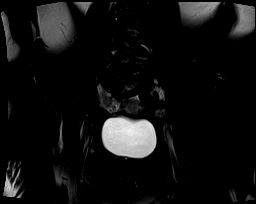
[im 35/41]
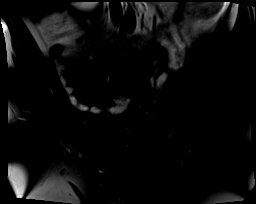
[im 41/41]
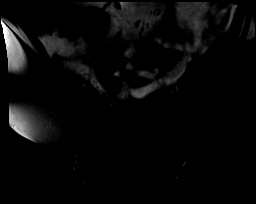

[Series 6: T1 · coronal · 4.0mm · 1.33mm/px · 9 of 41 slices shown]
[im 1/41]
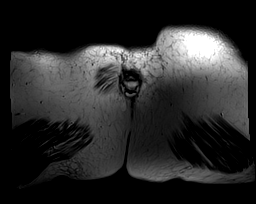
[im 6/41]
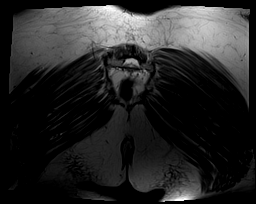
[im 11/41]
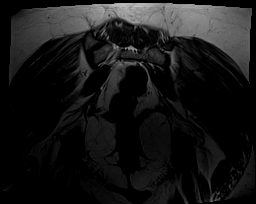
[im 16/41]
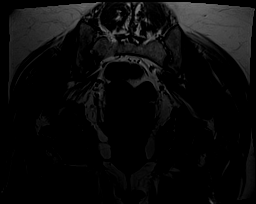
[im 21/41]
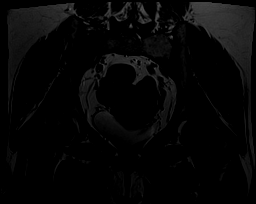
[im 26/41]
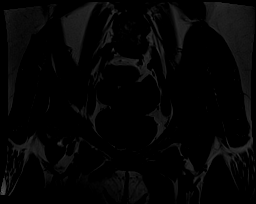
[im 31/41]
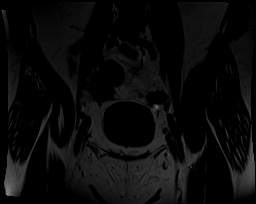
[im 36/41]
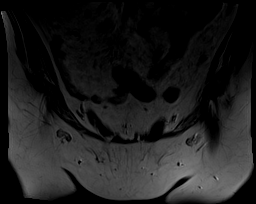
[im 41/41]
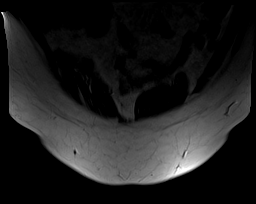

[Series 7: T1 fat-sat · coronal · 4.0mm · 0.70mm/px · 5 of 25 slices shown (1 of 3)]
[im 1/25]
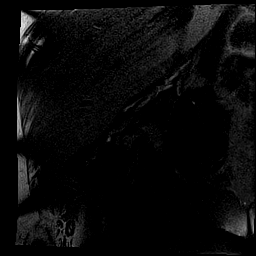
[im 7/25]
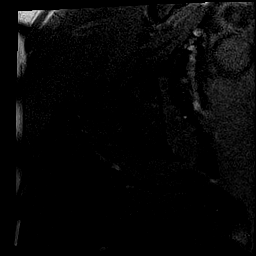
[im 13/25]
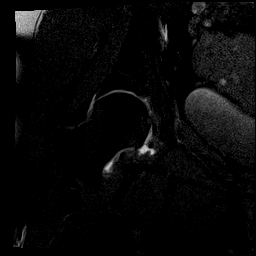
[im 19/25]
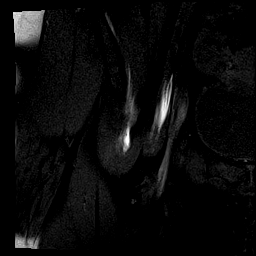
[im 25/25]
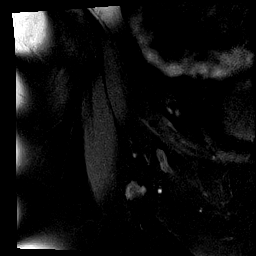

[Series 8: T1 fat-sat · sagittal · 4.0mm · 0.70mm/px · 5 of 24 slices shown (2 of 3)]
[im 1/24]
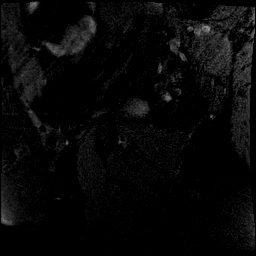
[im 6/24]
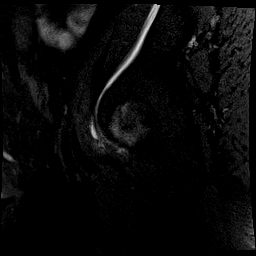
[im 12/24]
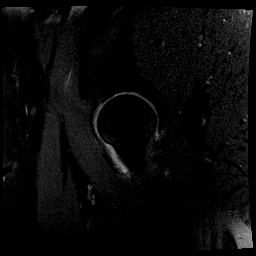
[im 18/24]
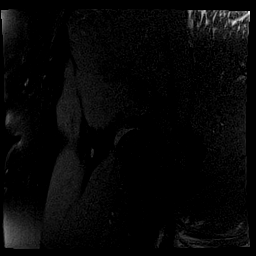
[im 24/24]
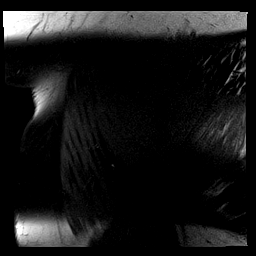

[Series 9: T1 fat-sat · oblique · 4.0mm · 0.70mm/px · 4 of 18 slices shown (3 of 3)]
[im 1/18]
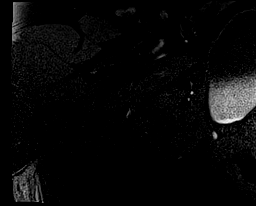
[im 6/18]
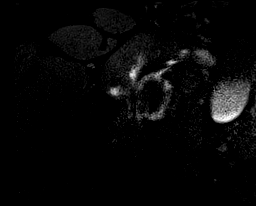
[im 12/18]
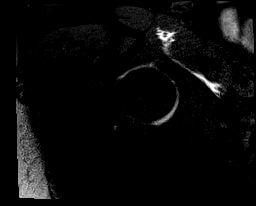
[im 18/18]
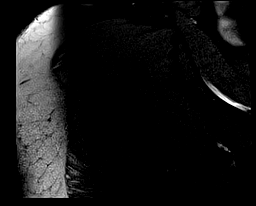

[Series 10: STIR · coronal · 4.0mm · 1.33mm/px · 9 of 41 slices shown]
[im 1/41]
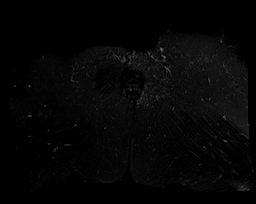
[im 6/41]
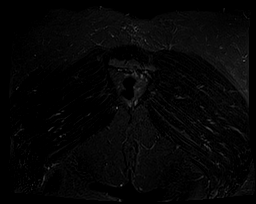
[im 11/41]
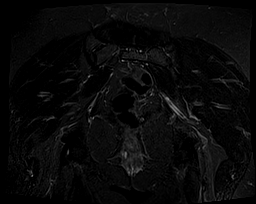
[im 16/41]
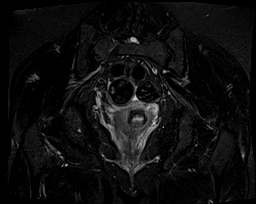
[im 21/41]
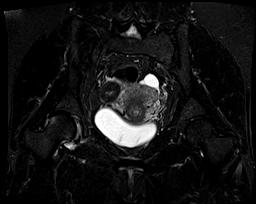
[im 26/41]
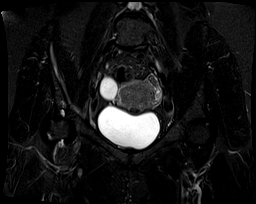
[im 31/41]
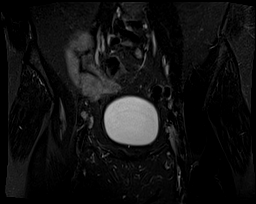
[im 36/41]
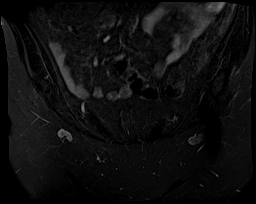
[im 41/41]
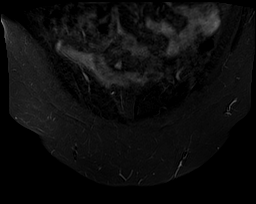

[40 of 40 positions shown; findings below may reference images not displayed]

FINDINGS: Bones: There is no evidence of acute fracture, dislocation or
avascular necrosis. There is asymmetric subchondral cyst formation
anteriorly in the right superior acetabulum. The visualized bony
pelvis otherwise appears normal. There are mild sacroiliac
degenerative changes with subchondral sclerosis bilaterally.

Articular cartilage and labrum

Articular cartilage: Asymmetric right hip degenerative changes with
chondral thinning and subchondral cyst formation anteriorly in the
right superior acetabulum. There is mild subchondral edema
peripherally in the right femoral head.

Labrum: Labral degeneration without evidence of tear or paralabral
cyst.

Joint or bursal effusion

Joint effusion: The right hip joint is adequately distended with
contrast. No evidence of intra-articular loose body. No significant
left hip joint effusion.

Bursae: No focal periarticular fluid collection. Contrast within the
right iliopsoas muscle attributed to the injection.

Muscles and tendons

Muscles and tendons: The visualized gluteus, hamstring and iliopsoas
tendons appear normal. The piriformis muscles appear symmetric.

Other findings

Miscellaneous: Several calcified uterine fibroids and an
intrauterine device are noted. The visualized internal pelvic
contents are otherwise unremarkable.
IMPRESSION: 1. Asymmetric right hip degenerative changes with subchondral cyst
formation anteriorly in the right superior acetabulum.
2. No evidence of labral tear.
3. Mild sacroiliac degenerative changes bilaterally. No acute
osseous findings.

## 2020-09-28 DIAGNOSIS — J301 Allergic rhinitis due to pollen: Secondary | ICD-10-CM | POA: Diagnosis not present

## 2020-10-12 DIAGNOSIS — J301 Allergic rhinitis due to pollen: Secondary | ICD-10-CM | POA: Diagnosis not present

## 2020-10-28 DIAGNOSIS — J301 Allergic rhinitis due to pollen: Secondary | ICD-10-CM | POA: Diagnosis not present

## 2020-11-09 DIAGNOSIS — J301 Allergic rhinitis due to pollen: Secondary | ICD-10-CM | POA: Diagnosis not present

## 2020-11-24 DIAGNOSIS — J301 Allergic rhinitis due to pollen: Secondary | ICD-10-CM | POA: Diagnosis not present

## 2020-11-25 ENCOUNTER — Telehealth: Payer: Self-pay | Admitting: Physician Assistant

## 2020-11-25 DIAGNOSIS — M1611 Unilateral primary osteoarthritis, right hip: Secondary | ICD-10-CM

## 2020-11-25 NOTE — Telephone Encounter (Signed)
Pt called. Her hip is not getting better with the cortizone inj.  She wants referral to an Orthopedic surgeon for an opinion.  Thank you.

## 2020-11-26 NOTE — Telephone Encounter (Signed)
Left msg for patient that the referral had been placed and their office would contact her about scheduling.

## 2020-11-26 NOTE — Telephone Encounter (Signed)
Referral placed.

## 2020-12-02 ENCOUNTER — Ambulatory Visit (INDEPENDENT_AMBULATORY_CARE_PROVIDER_SITE_OTHER): Payer: BC Managed Care – PPO | Admitting: Physician Assistant

## 2020-12-02 ENCOUNTER — Encounter: Payer: Self-pay | Admitting: Physician Assistant

## 2020-12-02 ENCOUNTER — Other Ambulatory Visit: Payer: Self-pay

## 2020-12-02 VITALS — BP 157/94 | HR 80 | Ht 64.0 in | Wt 212.0 lb

## 2020-12-02 DIAGNOSIS — Z23 Encounter for immunization: Secondary | ICD-10-CM

## 2020-12-02 DIAGNOSIS — M1611 Unilateral primary osteoarthritis, right hip: Secondary | ICD-10-CM

## 2020-12-02 DIAGNOSIS — Z0000 Encounter for general adult medical examination without abnormal findings: Secondary | ICD-10-CM | POA: Diagnosis not present

## 2020-12-02 DIAGNOSIS — Z13220 Encounter for screening for lipoid disorders: Secondary | ICD-10-CM | POA: Diagnosis not present

## 2020-12-02 DIAGNOSIS — I10 Essential (primary) hypertension: Secondary | ICD-10-CM | POA: Insufficient documentation

## 2020-12-02 DIAGNOSIS — Z131 Encounter for screening for diabetes mellitus: Secondary | ICD-10-CM

## 2020-12-02 DIAGNOSIS — Z1329 Encounter for screening for other suspected endocrine disorder: Secondary | ICD-10-CM

## 2020-12-02 NOTE — Patient Instructions (Addendum)
Diuretic is first line. Would cause you to urinate off just a little bit of extra fluid.   DASH Eating Plan DASH stands for Dietary Approaches to Stop Hypertension. The DASH eating plan is a healthy eating plan that has been shown to: Reduce high blood pressure (hypertension). Reduce your risk for type 2 diabetes, heart disease, and stroke. Help with weight loss. What are tips for following this plan? Reading food labels Check food labels for the amount of salt (sodium) per serving. Choose foods with less than 5 percent of the Daily Value of sodium. Generally, foods with less than 300 milligrams (mg) of sodium per serving fit into this eating plan. To find whole grains, look for the word "whole" as the first word in the ingredient list. Shopping Buy products labeled as "low-sodium" or "no salt added." Buy fresh foods. Avoid canned foods and pre-made or frozen meals. Cooking Avoid adding salt when cooking. Use salt-free seasonings or herbs instead of table salt or sea salt. Check with your health care provider or pharmacist before using salt substitutes. Do not fry foods. Cook foods using healthy methods such as baking, boiling, grilling, roasting, and broiling instead. Cook with heart-healthy oils, such as olive, canola, avocado, soybean, or sunflower oil. Meal planning  Eat a balanced diet that includes: 4 or more servings of fruits and 4 or more servings of vegetables each day. Try to fill one-half of your plate with fruits and vegetables. 6-8 servings of whole grains each day. Less than 6 oz (170 g) of lean meat, poultry, or fish each day. A 3-oz (85-g) serving of meat is about the same size as a deck of cards. One egg equals 1 oz (28 g). 2-3 servings of low-fat dairy each day. One serving is 1 cup (237 mL). 1 serving of nuts, seeds, or beans 5 times each week. 2-3 servings of heart-healthy fats. Healthy fats called omega-3 fatty acids are found in foods such as walnuts, flaxseeds,  fortified milks, and eggs. These fats are also found in cold-water fish, such as sardines, salmon, and mackerel. Limit how much you eat of: Canned or prepackaged foods. Food that is high in trans fat, such as some fried foods. Food that is high in saturated fat, such as fatty meat. Desserts and other sweets, sugary drinks, and other foods with added sugar. Full-fat dairy products. Do not salt foods before eating. Do not eat more than 4 egg yolks a week. Try to eat at least 2 vegetarian meals a week. Eat more home-cooked food and less restaurant, buffet, and fast food. Lifestyle When eating at a restaurant, ask that your food be prepared with less salt or no salt, if possible. If you drink alcohol: Limit how much you use to: 0-1 drink a day for women who are not pregnant. 0-2 drinks a day for men. Be aware of how much alcohol is in your drink. In the [place] one drink equals one 12 oz bottle of beer (355 mL), one 5 oz glass of wine (148 mL), or one 1 oz glass of hard liquor (44 mL). General information Avoid eating more than 2,300 mg of salt a day. If you have hypertension, you may need to reduce your sodium intake to 1,500 mg a day. Work with your health care provider to maintain a healthy body weight or to lose weight. Ask what an ideal weight is for you. Get at least 30 minutes of exercise that causes your heart to beat faster (aerobic exercise) most days  of the week. Activities may include walking, swimming, or biking. Work with your health care provider or dietitian to adjust your eating plan to your individual calorie needs. What foods should I eat? Fruits All fresh, dried, or frozen fruit. Canned fruit in natural juice (without added sugar). Vegetables Fresh or frozen vegetables (raw, steamed, roasted, or grilled). Low-sodium or reduced-sodium tomato and vegetable juice. Low-sodium or reduced-sodium tomato sauce and tomato paste. Low-sodium or reduced-sodium canned  vegetables. Grains Whole-grain or whole-wheat bread. Whole-grain or whole-wheat pasta. Brown rice. Elaine Murillo. Bulgur. Whole-grain and low-sodium cereals. Pita bread. Low-fat, low-sodium crackers. Whole-wheat flour tortillas. Meats and other proteins Skinless chicken or [place]. Ground chicken or [place]. Pork with fat trimmed off. Fish and seafood. Egg whites. Dried beans, peas, or lentils. Unsalted nuts, nut butters, and seeds. Unsalted canned beans. Lean cuts of beef with fat trimmed off. Low-sodium, lean precooked or cured meat, such as sausages or meat loaves. Dairy Low-fat (1%) or fat-free (skim) milk. Reduced-fat, low-fat, or fat-free cheeses. Nonfat, low-sodium ricotta or cottage cheese. Low-fat or nonfat yogurt. Low-fat, low-sodium cheese. Fats and oils Soft margarine without trans fats. Vegetable oil. Reduced-fat, low-fat, or light mayonnaise and salad dressings (reduced-sodium). Canola, safflower, olive, avocado, soybean, and sunflower oils. Avocado. Seasonings and condiments Herbs. Spices. Seasoning mixes without salt. Other foods Unsalted popcorn and pretzels. Fat-free sweets. The items listed above may not be a complete list of foods and beverages you can eat. Contact a dietitian for more information. What foods should I avoid? Fruits Canned fruit in a light or heavy syrup. Fried fruit. Fruit in cream or butter sauce. Vegetables Creamed or fried vegetables. Vegetables in a cheese sauce. Regular canned vegetables (not low-sodium or reduced-sodium). Regular canned tomato sauce and paste (not low-sodium or reduced-sodium). Regular tomato and vegetable juice (not low-sodium or reduced-sodium). Elaine Murillo. Olives. Grains Baked goods made with fat, such as croissants, muffins, or some breads. Dry pasta or rice meal packs. Meats and other proteins Fatty cuts of meat. Ribs. Fried meat. Elaine Murillo. Bologna, salami, and other precooked or cured meats, such as sausages or meat loaves. Fat from  the back of a pig (fatback). Bratwurst. Salted nuts and seeds. Canned beans with added salt. Canned or smoked fish. Whole eggs or egg yolks. Chicken or [place] with skin. Dairy Whole or 2% milk, cream, and half-and-half. Whole or full-fat cream cheese. Whole-fat or sweetened yogurt. Full-fat cheese. Nondairy creamers. Whipped toppings. Processed cheese and cheese spreads. Fats and oils Butter. Stick margarine. Lard. Shortening. Ghee. Bacon fat. Tropical oils, such as coconut, palm kernel, or palm oil. Seasonings and condiments Onion salt, garlic salt, seasoned salt, table salt, and sea salt. Worcestershire sauce. Tartar sauce. Barbecue sauce. Teriyaki sauce. Soy sauce, including reduced-sodium. Steak sauce. Canned and packaged gravies. Fish sauce. Oyster sauce. Cocktail sauce. Store-bought horseradish. Ketchup. Mustard. Meat flavorings and tenderizers. Bouillon cubes. Hot sauces. Pre-made or packaged marinades. Pre-made or packaged taco seasonings. Relishes. Regular salad dressings. Other foods Salted popcorn and pretzels. The items listed above may not be a complete list of foods and beverages you should avoid. Contact a dietitian for more information. Where to find more information National Heart, Lung, and Blood Institute: https://wilson-eaton.com/ American Heart Association: www.heart.org Academy of Nutrition and Dietetics: www.eatright.[place]: www.kidney.org Summary The DASH eating plan is a healthy eating plan that has been shown to reduce high blood pressure (hypertension). It may also reduce your risk for type 2 diabetes, heart disease, and stroke. When on the DASH eating plan, aim  to eat more fresh fruits and vegetables, whole grains, lean proteins, low-fat dairy, and heart-healthy fats. With the DASH eating plan, you should limit salt (sodium) intake to 2,300 mg a day. If you have hypertension, you may need to reduce your sodium intake to 1,500 mg a day. Work with your  health care provider or dietitian to adjust your eating plan to your individual calorie needs. This information is not intended to replace advice given to you by your health care provider. Make sure you discuss any questions you have with your health care provider. Document Revised: 01/24/2019 Document Reviewed: 01/24/2019 Elsevier Patient Education  2022 [place] Maintenance, Female Adopting a healthy lifestyle and getting preventive care are important in promoting health and wellness. Ask your health care provider about: The right schedule for you to have regular tests and exams. Things you can do on your own to prevent diseases and keep yourself healthy. What should I know about diet, weight, and exercise? Eat a healthy diet  Eat a diet that includes plenty of vegetables, fruits, low-fat dairy products, and lean protein. Do not eat a lot of foods that are high in solid fats, added sugars, or sodium. Maintain a healthy weight Body mass index (BMI) is used to identify weight problems. It estimates body fat based on height and weight. Your health care provider can help determine your BMI and help you achieve or maintain a healthy weight. Get regular exercise Get regular exercise. This is one of the most important things you can do for your health. Most adults should: Exercise for at least 150 minutes each week. The exercise should increase your heart rate and make you sweat (moderate-intensity exercise). Do strengthening exercises at least twice a week. This is in addition to the moderate-intensity exercise. Spend less time sitting. Even light physical activity can be beneficial. Watch cholesterol and blood lipids Have your blood tested for lipids and cholesterol at 50 years of age, then have this test every 5 years. Have your cholesterol levels checked more often if: Your lipid or cholesterol levels are high. You are older than 50 years of age. You are at high risk for heart  disease. What should I know about cancer screening? Depending on your health history and family history, you may need to have cancer screening at various ages. This may include screening for: Breast cancer. Cervical cancer. Colorectal cancer. Skin cancer. Lung cancer. What should I know about heart disease, diabetes, and high blood pressure? Blood pressure and heart disease High blood pressure causes heart disease and increases the risk of stroke. This is more likely to develop in people who have high blood pressure readings, are of African descent, or are overweight. Have your blood pressure checked: Every 3-5 years if you are 9-78 years of age. Every year if you are 66 years old or older. Diabetes Have regular diabetes screenings. This checks your fasting blood sugar level. Have the screening done: Once every three years after age 27 if you are at a normal weight and have a low risk for diabetes. More often and at a younger age if you are overweight or have a high risk for diabetes. What should I know about preventing infection? Hepatitis B If you have a higher risk for hepatitis B, you should be screened for this virus. Talk with your health care provider to find out if you are at risk for hepatitis B infection. Hepatitis C Testing is recommended for: Everyone born from [birth year] through 1965. Anyone  with known risk factors for hepatitis C. Sexually transmitted infections (STIs) Get screened for STIs, including gonorrhea and chlamydia, if: You are sexually active and are younger than 50 years of age. You are older than 50 years of age and your health care provider tells you that you are at risk for this type of infection. Your sexual activity has changed since you were last screened, and you are at increased risk for chlamydia or gonorrhea. Ask your health care provider if you are at risk. Ask your health care provider about whether you are at high risk for HIV. Your health care provider  may recommend a prescription medicine to help prevent HIV infection. If you choose to take medicine to prevent HIV, you should first get tested for HIV. You should then be tested every 3 months for as long as you are taking the medicine. Pregnancy If you are about to stop having your period (premenopausal) and you may become pregnant, seek counseling before you get pregnant. Take 400 to 800 micrograms (mcg) of folic acid every day if you become pregnant. Ask for birth control (contraception) if you want to prevent pregnancy. Osteoporosis and menopause Osteoporosis is a disease in which the bones lose minerals and strength with aging. This can result in bone fractures. If you are 80 years old or older, or if you are at risk for osteoporosis and fractures, ask your health care provider if you should: Be screened for bone loss. Take a calcium or vitamin D supplement to lower your risk of fractures. Be given hormone replacement therapy (HRT) to treat symptoms of menopause. Follow these instructions at home: Lifestyle Do not use any products that contain nicotine or tobacco, such as cigarettes, e-cigarettes, and chewing tobacco. If you need help quitting, ask your health care provider. Do not use street drugs. Do not share needles. Ask your health care provider for help if you need support or information about quitting drugs. Alcohol use Do not drink alcohol if: Your health care provider tells you not to drink. You are pregnant, may be pregnant, or are planning to become pregnant. If you drink alcohol: Limit how much you use to 0-1 drink a day. Limit intake if you are breastfeeding. Be aware of how much alcohol is in your drink. In the [place] one drink equals one 12 oz bottle of beer (355 mL), one 5 oz glass of wine (148 mL), or one 1 oz glass of hard liquor (44 mL). General instructions Schedule regular health, dental, and eye exams. Stay current with your vaccines. Tell your health care  provider if: You often feel depressed. You have ever been abused or do not feel safe at home. Summary Adopting a healthy lifestyle and getting preventive care are important in promoting health and wellness. Follow your health care provider's instructions about healthy diet, exercising, and getting tested or screened for diseases. Follow your health care provider's instructions on monitoring your cholesterol and blood pressure. This information is not intended to replace advice given to you by your health care provider. Make sure you discuss any questions you have with your health care provider. Document Revised: 04/30/2020 Document Reviewed: 02/13/2018 Elsevier Patient Education  2022 [employer].

## 2020-12-02 NOTE — Progress Notes (Signed)
Subjective:    Patient ID: Elaine Murillo, female    DOB: February 20, 1971, 50 y.o.   MRN: [id number]  HPI  Patient is a 50y/o female who presents to the clinic for an annual physical. Patient has a PMH of osteoarthritis of the right hip. Patient states she has been taking the Voltaren 75 mg tablet daily and it has improved her hip pain. Patient states she has been exercising regularly with swimming and cycling. She does have some hip weakness when using the bike. Patient states her diet has been fairly healthy. Patient denies chest pain, headaches, vision changes, abdominal pain, and stool changes.   .. Active Ambulatory Problems    Diagnosis Date Noted   WEIGHT GAIN 08/20/2008   Need for prophylactic vaccination and inoculation against influenza 11/21/2012   Vitamin D insufficiency 02/03/2014   Right-sided low back pain with right-sided sciatica 02/16/2014   Hallux valgus of left foot 02/08/2015   Metatarsalgia of left foot 02/08/2015   Class 2 obesity due to excess calories without serious comorbidity with body mass index (BMI) of 36.0 to 36.9 in adult 02/08/2015   Migraine variant 11/16/2015   Allergic rhinitis with postnasal drip 06/01/2016   Elevated blood pressure reading 06/06/2016   Dry skin 12/29/2016   Itching 12/29/2016   Large breasts 01/18/2018   ETD (Eustachian tube dysfunction), right 12/03/2018   Facial flushing 12/08/2018   Abnormal RBC 12/08/2018   IUD (intrauterine device) in place 05/27/2019   Primary osteoarthritis of right hip 09/04/2019   Hand pain 09/04/2019   Women's annual routine gynecological examination 12/08/2019   Pain in left toe(s) 03/17/2020   Obesity (BMI 30-39.9) 06/01/2020   Primary hypertension 12/02/2020   Resolved Ambulatory Problems    Diagnosis Date Noted   PLANTAR FASCIITIS, BILATERAL 08/20/2008   Encounter for supervision of normal pregnancy 12/18/2011   Labor, precipitous, delivered 08/05/2012   High triglycerides 02/03/2014   Rash and  nonspecific skin eruption 02/08/2015   Hip impingement syndrome, right 11/25/2019   Past Medical History:  Diagnosis Date   Allergy    Anxiety    Arthritis    Hypertension    Uterine fibroids affecting pregnancy    .Marland Kitchen Family History  Problem Relation Age of Onset   Stroke Paternal Grandmother    Colon cancer Neg Hx    Colon polyps Neg Hx    Esophageal cancer Neg Hx    Rectal cancer Neg Hx    Stomach cancer Neg Hx    .Marland Kitchen Social History   Socioeconomic History   Marital status: Married    Spouse name: Not on file   Number of children: Not on file   Years of education: Not on file   Highest education level: Not on file  Occupational History   Occupation: homemaker  Tobacco Use   Smoking status: Never   Smokeless tobacco: Never  Vaping Use   Vaping Use: Never used  Substance and Sexual Activity   Alcohol use: No   Drug use: No   Sexual activity: Yes    Partners: Male    Birth control/protection: I.U.D.  Other Topics Concern   Not on file  Social History Narrative   Not on file   Social Determinants of Health   Financial Resource Strain: Not on file  Food Insecurity: Not on file  Transportation Needs: Not on file  Physical Activity: Not on file  Stress: Not on file  Social Connections: Not on file  Intimate Partner Violence: Not on file  Review of Systems See HPI.    Objective:   Physical Exam BP (!) 157/94   Pulse 80   Ht 5\' 4"  (1.626 m)   Wt 212 lb (96.2 kg)   SpO2 100%   BMI 36.39 kg/m   General Appearance:    Alert, cooperative, no distress, appears stated age  Head:    Normocephalic, without obvious abnormality, atraumatic  Eyes:    PERRL, conjunctiva/corneas clear, EOM's intact, fundi    benign, both eyes  Ears:    Normal TM's and external ear canals, both ears  Nose:   Nares normal, septum midline, mucosa normal, no drainage    or sinus tenderness  Throat:   Lips, mucosa, and tongue normal; teeth and gums normal  Neck:   Supple,  symmetrical, trachea midline, no adenopathy;    thyroid:  no enlargement/tenderness/nodules; no carotid   bruit or JVD  Back:     Symmetric, no curvature, ROM normal, no CVA tenderness  Lungs:     Clear to auscultation bilaterally, respirations unlabored  Chest Wall:    No tenderness or deformity   Heart:    Regular rate and rhythm, S1 and S2 normal, no murmur, rub   or gallop     Abdomen:     Soft, non-tender, bowel sounds active all four quadrants,    no masses, no organomegaly        Extremities:   Extremities normal, atraumatic, no cyanosis or edema  Pulses:   2+ and symmetric all extremities  Skin:   Skin color, texture, turgor normal, no rashes or lesions  Lymph nodes:   Cervical, supraclavicular, and axillary nodes normal     .. Depression screen [facility] 2/9 12/02/2020 12/03/2019 12/03/2018 01/18/2018 12/27/2016  Decreased Interest 0 0 0 0 0  Down, Depressed, Hopeless 0 0 0 0 0  PHQ - 2 Score 0 0 0 0 0  Altered sleeping 0 0 0 - -  Tired, decreased energy 1 1 1  - -  Change in appetite 0 0 1 - -  Feeling bad or failure about yourself  0 0 0 - -  Trouble concentrating 0 0 0 - -  Moving slowly or fidgety/restless 0 0 0 - -  Suicidal thoughts 0 0 0 - -  PHQ-9 Score 1 1 2  - -  Difficult doing work/chores Not difficult at all Not difficult at all Not difficult at all - -   .Marland Kitchen GAD 7 : Generalized Anxiety Score 12/02/2020 12/03/2019 12/03/2018  Nervous, Anxious, on Edge 1 0 0  Control/stop worrying 0 0 0  Worry too much - different things 1 0 0  Trouble relaxing 0 0 0  Restless 0 0 0  Easily annoyed or irritable 0 0 0  Afraid - awful might happen 0 0 0  Total GAD 7 Score 2 0 0  Anxiety Difficulty Not difficult at all Not difficult at all Not difficult at all           Assessment & Plan:   Marland KitchenMarland KitchenCaitriona was seen today for annual exam.  Diagnoses and all orders for this visit:  Routine physical examination -     TSH -     Lipid Panel w/reflex Direct LDL -     COMPLETE METABOLIC  PANEL WITH GFR -     CBC with Differential/Platelet  Screening for diabetes mellitus -     COMPLETE METABOLIC PANEL WITH GFR  Screening for lipid disorders -     Lipid Panel w/reflex  Direct LDL  Thyroid disorder screen -     TSH  Need for shingles vaccine -     Varicella-zoster vaccine IM  Flu vaccine need -     Flu Vaccine QUAD 54mo+IM (Fluarix, Fluzone & Alfiuria Quad PF)  Primary osteoarthritis of right hip  Primary hypertension   .Marland Kitchen Discussed 150 minutes of exercise a week.  Encouraged vitamin D 1000 units and Calcium 1300mg  or 4 servings of dairy a day.  Fasting labs ordered today.  PHQ/GAD no concerns today. Mood does worsen around pain days.  Pap UTD.  Mammogram UTD. Colonoscopy UTD.  Flu and Shingles Vaccine Administered. Patient's blood pressure was elevated at 157/94. Patient told to keep a daily log of blood pressure. Advised patient to avoid high sodium foods and given DASH diet. Discussed HcTZ but pt does not want to start medication.  Patient will follow up for right hip osteoarthritis with Ortho.   Vernetta Honey PA-C, have reviewed and agree with the above documentation in it's entirety.

## 2020-12-03 LAB — COMPLETE METABOLIC PANEL WITH GFR
AG Ratio: 1.6 (calc) (ref 1.0–2.5)
ALT: 17 U/L (ref 6–29)
AST: 15 U/L (ref 10–35)
Albumin: 4.2 g/dL (ref 3.6–5.1)
Alkaline phosphatase (APISO): 67 U/L (ref 37–153)
BUN: 14 mg/dL (ref 7–25)
CO2: 29 mmol/L (ref 20–32)
Calcium: 9.2 mg/dL (ref 8.6–10.4)
Chloride: 103 mmol/L (ref 98–110)
Creat: 0.84 mg/dL (ref 0.50–1.03)
Globulin: 2.6 g/dL (calc) (ref 1.9–3.7)
Glucose, Bld: 83 mg/dL (ref 65–99)
Potassium: 4.8 mmol/L (ref 3.5–5.3)
Sodium: 137 mmol/L (ref 135–146)
Total Bilirubin: 0.6 mg/dL (ref 0.2–1.2)
Total Protein: 6.8 g/dL (ref 6.1–8.1)
eGFR: 85 mL/min/{1.73_m2} (ref >=60)

## 2020-12-03 LAB — LIPID PANEL W/REFLEX DIRECT LDL
Cholesterol: 199 mg/dL (ref <200)
HDL: 49 mg/dL — ABNORMAL LOW (ref >=50)
LDL Cholesterol (Calc): 123 mg/dL (calc) — ABNORMAL HIGH
Non-HDL Cholesterol (Calc): 150 mg/dL (calc) — ABNORMAL HIGH (ref <130)
Total CHOL/HDL Ratio: 4.1 (calc) (ref <5.0)
Triglycerides: 159 mg/dL — ABNORMAL HIGH (ref <150)

## 2020-12-03 LAB — CBC WITH DIFFERENTIAL/PLATELET
Absolute Monocytes: 350 cells/uL (ref 200–950)
Basophils Absolute: 70 cells/uL (ref 0–200)
Basophils Relative: 1 %
Eosinophils Absolute: 168 cells/uL (ref 15–500)
Eosinophils Relative: 2.4 %
HCT: 45.9 % — ABNORMAL HIGH (ref 35.0–45.0)
Hemoglobin: 14.8 g/dL (ref 11.7–15.5)
Lymphs Abs: 2443 cells/uL (ref 850–3900)
MCH: 29.6 pg (ref 27.0–33.0)
MCHC: 32.2 g/dL (ref 32.0–36.0)
MCV: 91.8 fL (ref 80.0–100.0)
MPV: 10 fL (ref 7.5–12.5)
Monocytes Relative: 5 %
Neutro Abs: 3969 cells/uL (ref 1500–7800)
Neutrophils Relative %: 56.7 %
Platelets: 297 10*3/uL (ref 140–400)
RBC: 5 10*6/uL (ref 3.80–5.10)
RDW: 12.9 % (ref 11.0–15.0)
Total Lymphocyte: 34.9 %
WBC: 7 10*3/uL (ref 3.8–10.8)

## 2020-12-03 LAB — TSH: TSH: 1.54 mIU/L

## 2020-12-03 NOTE — Progress Notes (Signed)
Cameron,   Thyroid looks great.  Kidney, liver, glucose look great.  LDL is stable. Optimal goal is under 100 but with no risk factors under 130 is good.  Your HDL, good cholesterol, dropped a lot. We do want that higher. Exercise increases your good cholesterol and likely due to your hip pain your exercise has decreased and this your HDL decreased. Hopefully after hip is corrected you can get back to exercise. Try to find some form of exercise that is more tolerable on the hip. Could you swim?

## 2020-12-08 DIAGNOSIS — J301 Allergic rhinitis due to pollen: Secondary | ICD-10-CM | POA: Diagnosis not present

## 2020-12-13 DIAGNOSIS — L719 Rosacea, unspecified: Secondary | ICD-10-CM | POA: Diagnosis not present

## 2020-12-13 DIAGNOSIS — D1801 Hemangioma of skin and subcutaneous tissue: Secondary | ICD-10-CM | POA: Diagnosis not present

## 2020-12-13 DIAGNOSIS — L579 Skin changes due to chronic exposure to nonionizing radiation, unspecified: Secondary | ICD-10-CM | POA: Diagnosis not present

## 2020-12-23 DIAGNOSIS — M25551 Pain in right hip: Secondary | ICD-10-CM | POA: Diagnosis not present

## 2020-12-29 DIAGNOSIS — J301 Allergic rhinitis due to pollen: Secondary | ICD-10-CM | POA: Diagnosis not present

## 2021-01-14 DIAGNOSIS — J301 Allergic rhinitis due to pollen: Secondary | ICD-10-CM | POA: Diagnosis not present

## 2021-01-18 DIAGNOSIS — J301 Allergic rhinitis due to pollen: Secondary | ICD-10-CM | POA: Diagnosis not present

## 2021-02-01 DIAGNOSIS — J301 Allergic rhinitis due to pollen: Secondary | ICD-10-CM | POA: Diagnosis not present

## 2021-02-14 DIAGNOSIS — J301 Allergic rhinitis due to pollen: Secondary | ICD-10-CM | POA: Diagnosis not present

## 2021-02-22 DIAGNOSIS — J039 Acute tonsillitis, unspecified: Secondary | ICD-10-CM | POA: Diagnosis not present

## 2021-03-08 ENCOUNTER — Other Ambulatory Visit: Payer: Self-pay | Admitting: Advanced Practice Midwife

## 2021-03-08 DIAGNOSIS — J301 Allergic rhinitis due to pollen: Secondary | ICD-10-CM | POA: Diagnosis not present

## 2021-03-08 DIAGNOSIS — Z1231 Encounter for screening mammogram for malignant neoplasm of breast: Secondary | ICD-10-CM

## 2021-03-15 DIAGNOSIS — Z01812 Encounter for preprocedural laboratory examination: Secondary | ICD-10-CM | POA: Diagnosis not present

## 2021-03-18 ENCOUNTER — Other Ambulatory Visit: Payer: Self-pay

## 2021-03-18 ENCOUNTER — Ambulatory Visit (INDEPENDENT_AMBULATORY_CARE_PROVIDER_SITE_OTHER): Payer: BC Managed Care – PPO | Admitting: Physician Assistant

## 2021-03-18 VITALS — Temp 98.1°F

## 2021-03-18 DIAGNOSIS — Z23 Encounter for immunization: Secondary | ICD-10-CM | POA: Diagnosis not present

## 2021-03-18 NOTE — Progress Notes (Signed)
Established Patient Office Visit  Subjective:  Patient ID: Elaine Murillo, female    DOB: 17-Aug-1970  Age: 51 y.o. MRN: [id number]  CC:  Chief Complaint  Patient presents with   Immunizations    HPI Elaine Murillo presents for shingles vaccine.   Past Medical History:  Diagnosis Date   Allergy    seasonal   Anxiety    Arthritis    Hypertension    Uterine fibroids affecting pregnancy     Past Surgical History:  Procedure Laterality Date   TONSILLECTOMY      Family History  Problem Relation Age of Onset   Stroke Paternal Grandmother    Colon cancer Neg Hx    Colon polyps Neg Hx    Esophageal cancer Neg Hx    Rectal cancer Neg Hx    Stomach cancer Neg Hx     Social History   Socioeconomic History   Marital status: Married    Spouse name: Not on file   Number of children: Not on file   Years of education: Not on file   Highest education level: Not on file  Occupational History   Occupation: homemaker  Tobacco Use   Smoking status: Never   Smokeless tobacco: Never  Vaping Use   Vaping Use: Never used  Substance and Sexual Activity   Alcohol use: No   Drug use: No   Sexual activity: Yes    Partners: Male    Birth control/protection: I.U.D.  Other Topics Concern   Not on file  Social History Narrative   Not on file   Social Determinants of Health   Financial Resource Strain: Not on file  Food Insecurity: Not on file  Transportation Needs: Not on file  Physical Activity: Not on file  Stress: Not on file  Social Connections: Not on file  Intimate Partner Violence: Not on file    Outpatient Medications Prior to Visit  Medication Sig Dispense Refill   cetirizine (ZYRTEC) 10 MG tablet Take 10 mg by mouth daily.     levonorgestrel (LILETTA, 52 MG,) 19.5 MCG/DAY IUD IUD 1 each by Intrauterine route once.     Omega-3 Fatty Acids (FISH OIL) 1000 MG CAPS Take 4,000 mg by mouth daily.     SOOLANTRA 1 % CREA Apply topically.     No facility-administered  medications prior to visit.    No Known Allergies  ROS Review of Systems    Objective:    Physical Exam  Temp 98.1 F (36.7 C) (Temporal)  Wt Readings from Last 3 Encounters:  12/02/20 212 lb (96.2 kg)  06/01/20 218 lb (98.9 kg)  02/10/20 218 lb (98.9 kg)     Health Maintenance Due  Topic Date Due   COVID-19 Vaccine (3 - Booster for Moderna series) 08/28/2019    There are no preventive care reminders to display for this patient.  Lab Results  Component Value Date   TSH 1.54 12/02/2020   Lab Results  Component Value Date   WBC 7.0 12/02/2020   HGB 14.8 12/02/2020   HCT 45.9 (H) 12/02/2020   MCV 91.8 12/02/2020   PLT 297 12/02/2020   Lab Results  Component Value Date   NA 137 12/02/2020   K 4.8 12/02/2020   CO2 29 12/02/2020   GLUCOSE 83 12/02/2020   BUN 14 12/02/2020   CREATININE 0.84 12/02/2020   BILITOT 0.6 12/02/2020   ALKPHOS 49 12/03/2014   AST 15 12/02/2020   ALT 17 12/02/2020   PROT  12/02/2020  ° ALBUMIN 3.9 12/03/2014  ° CALCIUM 9.2 12/02/2020  ° EGFR 85 12/02/2020  ° °Lab Results  °Component Value Date  ° CHOL 199 12/02/2020  ° °Lab Results  °Component Value Date  ° HDL 49 (L) 12/02/2020  ° °Lab Results  °Component Value Date  ° LDLCALC 123 (H) 12/02/2020  ° °Lab Results  °Component Value Date  ° TRIG 159 (H) 12/02/2020  ° °Lab Results  °Component Value Date  ° CHOLHDL 4.1 12/02/2020  ° °No results found for: HGBA1C ° °  °Assessment & Plan:  °Vaccine - Patient tolerated injection well without complications.  ° °Problem List Items Addressed This Visit   °None °Visit Diagnoses   ° ° Need for shingles vaccine    -  Primary  ° Relevant Orders  ° Varicella-zoster vaccine IM (Shingrix) (Completed)  ° °  ° ° °No orders of the defined types were placed in this encounter. ° ° °Follow-up: No follow-ups on file.  ° ° °Tuttle, Angela Hale, CMA °

## 2021-03-18 NOTE — Progress Notes (Signed)
Agree with above plan. 

## 2021-03-21 DIAGNOSIS — J301 Allergic rhinitis due to pollen: Secondary | ICD-10-CM | POA: Diagnosis not present

## 2021-04-05 DIAGNOSIS — J301 Allergic rhinitis due to pollen: Secondary | ICD-10-CM | POA: Diagnosis not present

## 2021-04-07 DIAGNOSIS — M25551 Pain in right hip: Secondary | ICD-10-CM | POA: Diagnosis not present

## 2021-04-11 DIAGNOSIS — M1611 Unilateral primary osteoarthritis, right hip: Secondary | ICD-10-CM | POA: Diagnosis not present

## 2021-04-11 DIAGNOSIS — M25651 Stiffness of right hip, not elsewhere classified: Secondary | ICD-10-CM | POA: Diagnosis not present

## 2021-04-11 DIAGNOSIS — R262 Difficulty in walking, not elsewhere classified: Secondary | ICD-10-CM | POA: Diagnosis not present

## 2021-04-13 ENCOUNTER — Ambulatory Visit (INDEPENDENT_AMBULATORY_CARE_PROVIDER_SITE_OTHER): Payer: BC Managed Care – PPO

## 2021-04-13 ENCOUNTER — Other Ambulatory Visit: Payer: Self-pay

## 2021-04-13 DIAGNOSIS — Z1231 Encounter for screening mammogram for malignant neoplasm of breast: Secondary | ICD-10-CM | POA: Diagnosis not present

## 2021-04-14 ENCOUNTER — Telehealth: Payer: Self-pay | Admitting: Neurology

## 2021-04-14 NOTE — Telephone Encounter (Signed)
Patient returned call and has been scheduled for 2/13 at 1pm

## 2021-04-14 NOTE — Telephone Encounter (Signed)
Received vm from [facility] stating they faxed a request for medical clearance to [place] for patient's surgery. Never received fax. Called Judy back and left vm letting her know we never received request and to resend.   Please call patient and schedule for preop exam with ekg. Thanks!

## 2021-04-14 NOTE — Telephone Encounter (Signed)
LVM with Pt asking them to give [place] a call back so we can schedule her preop exam.

## 2021-04-18 ENCOUNTER — Encounter: Payer: Self-pay | Admitting: Physician Assistant

## 2021-04-18 ENCOUNTER — Other Ambulatory Visit: Payer: Self-pay

## 2021-04-18 ENCOUNTER — Ambulatory Visit (INDEPENDENT_AMBULATORY_CARE_PROVIDER_SITE_OTHER): Payer: BC Managed Care – PPO | Admitting: Physician Assistant

## 2021-04-18 VITALS — BP 148/78 | HR 77

## 2021-04-18 DIAGNOSIS — Z01818 Encounter for other preprocedural examination: Secondary | ICD-10-CM

## 2021-04-18 DIAGNOSIS — R030 Elevated blood-pressure reading, without diagnosis of hypertension: Secondary | ICD-10-CM | POA: Insufficient documentation

## 2021-04-18 DIAGNOSIS — M1611 Unilateral primary osteoarthritis, right hip: Secondary | ICD-10-CM

## 2021-04-18 NOTE — Progress Notes (Signed)
Subjective:    Patient ID: Elaine Murillo, female    DOB: 07-28-1970, 51 y.o.   MRN: [id number]  HPI Pt is a 51 yo obese female with right hip OA who is scheduled for right hip replacement on 04/20/2021 with [place] and Dr. Berenice Primas. She had labs done per patient this week. She has no concerns or complaints. She is taking her BP at home and getting readings in 130s over 80s. No CP, palpitations, headaches.    Active Ambulatory Problems    Diagnosis Date Noted   WEIGHT GAIN 08/20/2008   Need for prophylactic vaccination and inoculation against influenza 11/21/2012   Vitamin D insufficiency 02/03/2014   Right-sided low back pain with right-sided sciatica 02/16/2014   Hallux valgus of left foot 02/08/2015   Metatarsalgia of left foot 02/08/2015   Class 2 obesity due to excess calories without serious comorbidity with body mass index (BMI) of 36.0 to 36.9 in adult 02/08/2015   Migraine variant 11/16/2015   Allergic rhinitis with postnasal drip 06/01/2016   Elevated blood pressure reading 06/06/2016   Dry skin 12/29/2016   Itching 12/29/2016   Large breasts 01/18/2018   ETD (Eustachian tube dysfunction), right 12/03/2018   Facial flushing 12/08/2018   Abnormal RBC 12/08/2018   IUD (intrauterine device) in place 05/27/2019   Primary osteoarthritis of right hip 09/04/2019   Hand pain 09/04/2019   Women's annual routine gynecological examination 12/08/2019   Pain in left toe(s) 03/17/2020   Obesity (BMI 30-39.9) 06/01/2020   Primary hypertension 12/02/2020   White coat syndrome without hypertension 04/18/2021   Resolved Ambulatory Problems    Diagnosis Date Noted   PLANTAR FASCIITIS, BILATERAL 08/20/2008   Encounter for supervision of normal pregnancy 12/18/2011   Labor, precipitous, delivered 08/05/2012   High triglycerides 02/03/2014   Rash and nonspecific skin eruption 02/08/2015   Hip impingement syndrome, right 11/25/2019   Past Medical History:  Diagnosis Date    Allergy    Anxiety    Arthritis    Hypertension    Uterine fibroids affecting pregnancy      Review of Systems  All other systems reviewed and are negative.     Objective:   Physical Exam Vitals reviewed.  Constitutional:      Appearance: Normal appearance. She is obese.  HENT:     Head: Normocephalic.     Nose: Nose normal.     Mouth/Throat:     Mouth: Mucous membranes are moist.  Eyes:     Conjunctiva/sclera: Conjunctivae normal.  Cardiovascular:     Rate and Rhythm: Normal rate and regular rhythm.     Pulses: Normal pulses.     Heart sounds: Normal heart sounds.  Pulmonary:     Effort: Pulmonary effort is normal.     Breath sounds: Normal breath sounds.  Lymphadenopathy:     Cervical: No cervical adenopathy.  Neurological:     General: No focal deficit present.     Mental Status: She is alert.  Psychiatric:        Mood and Affect: Mood normal.    EKG: NSR, no ST elevation or depression, QRS is double peaked in AvF.       Assessment & Plan:  Marland KitchenMarland KitchenBradyn was seen today for pre-op exam.  Diagnoses and all orders for this visit:  Preop examination -     EKG 12-Lead  Primary osteoarthritis of right hip  White coat syndrome without hypertension   EKG no concerns. No EKG to compare to.  BP a little elevated today but home readings much better in the 130s/80s. Pt does have some white coat hypertension.  Reviewed labs through patient portal with no concerns. Normal WBC, HgB, GFR, PT/INR. Preop approved. Faxed approval to [facility].

## 2021-04-20 DIAGNOSIS — M1611 Unilateral primary osteoarthritis, right hip: Secondary | ICD-10-CM | POA: Diagnosis not present

## 2021-04-25 DIAGNOSIS — M25551 Pain in right hip: Secondary | ICD-10-CM | POA: Diagnosis not present

## 2021-04-25 DIAGNOSIS — M6281 Muscle weakness (generalized): Secondary | ICD-10-CM | POA: Diagnosis not present

## 2021-05-04 DIAGNOSIS — J301 Allergic rhinitis due to pollen: Secondary | ICD-10-CM | POA: Diagnosis not present

## 2021-05-18 DIAGNOSIS — J301 Allergic rhinitis due to pollen: Secondary | ICD-10-CM | POA: Diagnosis not present

## 2021-06-01 DIAGNOSIS — J301 Allergic rhinitis due to pollen: Secondary | ICD-10-CM | POA: Diagnosis not present

## 2021-06-15 DIAGNOSIS — J301 Allergic rhinitis due to pollen: Secondary | ICD-10-CM | POA: Diagnosis not present

## 2021-06-29 DIAGNOSIS — J301 Allergic rhinitis due to pollen: Secondary | ICD-10-CM | POA: Diagnosis not present

## 2021-07-14 DIAGNOSIS — J301 Allergic rhinitis due to pollen: Secondary | ICD-10-CM | POA: Diagnosis not present

## 2021-07-15 DIAGNOSIS — J029 Acute pharyngitis, unspecified: Secondary | ICD-10-CM | POA: Diagnosis not present

## 2021-07-22 ENCOUNTER — Ambulatory Visit (INDEPENDENT_AMBULATORY_CARE_PROVIDER_SITE_OTHER): Payer: BC Managed Care – PPO | Admitting: Obstetrics and Gynecology

## 2021-07-22 ENCOUNTER — Encounter: Payer: Self-pay | Admitting: Obstetrics and Gynecology

## 2021-07-22 VITALS — BP 147/88 | HR 85 | Ht 64.0 in | Wt 223.0 lb

## 2021-07-22 DIAGNOSIS — R32 Unspecified urinary incontinence: Secondary | ICD-10-CM

## 2021-07-22 DIAGNOSIS — Z01419 Encounter for gynecological examination (general) (routine) without abnormal findings: Secondary | ICD-10-CM | POA: Diagnosis not present

## 2021-07-22 DIAGNOSIS — Z86018 Personal history of other benign neoplasm: Secondary | ICD-10-CM | POA: Diagnosis not present

## 2021-07-22 NOTE — Progress Notes (Signed)
GYNECOLOGY ANNUAL PREVENTATIVE CARE ENCOUNTER NOTE  History:     Elaine Murillo is a 51 y.o. G23P2002 female here for a routine annual gynecologic exam.  Current complaints: some urinary incontinence. This does not occur all the time or everyday, however it is become more noticeable. Denies abnormal vaginal bleeding, discharge, pelvic pain, problems with intercourse or other gynecologic concerns.  Hx of Fibroids.  No hot flashes/no signs of menopause. Unsure of when mother went through menopause.  IUD in place.  Some occasional urination with sneezing only.   Gynecologic History No LMP recorded. (Menstrual status: IUD). Contraception: IUD Last Pap: 2021. Result was normal with negative HPV Last Mammogram: 04/13/2021.  Result was normal Last Colonoscopy: 02/10/2020.  Result was normal  Obstetric History OB History  Gravida Para Term Preterm AB Living  '2 2 2     2  '$ SAB IAB Ectopic Multiple Live Births          2    # Outcome Date GA Lbr Len/2nd Weight Sex Delivery Anes PTL Lv  2 Term 06/23/12 [id number] / 00:04 9 lb 6 oz (4.252 kg) M Vag-Spont None  LIV  1 Term 10/22/07 [id number] lb 1 oz (3.204 kg) F Vag-Spont EPI N LIV    Past Medical History:  Diagnosis Date   Allergy    seasonal   Anxiety    Arthritis    Hypertension    Uterine fibroids affecting pregnancy     Past Surgical History:  Procedure Laterality Date   TONSILLECTOMY      Current Outpatient Medications on File Prior to Visit  Medication Sig Dispense Refill   cetirizine (ZYRTEC) 10 MG tablet Take 10 mg by mouth daily.     levonorgestrel (LILETTA, 52 MG,) 19.5 MCG/DAY IUD IUD 1 each by Intrauterine route once.     Omega-3 Fatty Acids (FISH OIL) 1000 MG CAPS Take 4,000 mg by mouth daily.     SOOLANTRA 1 % CREA Apply topically.     No current facility-administered medications on file prior to visit.    No Known Allergies  Social History:  reports that she has never smoked. She has never used smokeless  tobacco. She reports that she does not drink alcohol and does not use drugs.  Family History  Problem Relation Age of Onset   Stroke Paternal Grandmother    Colon cancer Neg Hx    Colon polyps Neg Hx    Esophageal cancer Neg Hx    Rectal cancer Neg Hx    Stomach cancer Neg Hx     The following portions of the patient's history were reviewed and updated as appropriate: allergies, current medications, past family history, past medical history, past social history, past surgical history and problem list.  Review of Systems Pertinent items noted in HPI and remainder of comprehensive ROS otherwise negative.  Physical Exam:  BP (!) 147/88   Pulse 85   Ht '5\' 4"'$  (1.626 m)   Wt 223 lb (101.2 kg)   BMI 38.28 kg/m  CONSTITUTIONAL: Well-developed, well-nourished female in no acute distress.  HENT:  Normocephalic, atraumatic, External right and left ear normal.  EYES: Conjunctivae and EOM are normal. Pupils are equal, round, and reactive to light. No scleral icterus.  NECK: Normal range of motion, supple, no masses.  Normal thyroid.  SKIN: Skin is warm and dry. No rash noted. Not diaphoretic. No erythema. No pallor. MUSCULOSKELETAL: Normal range of motion. No tenderness.  No cyanosis, clubbing, or edema.  NEUROLOGIC: Alert and oriented to person, place, and time. Normal reflexes, muscle tone coordination.  PSYCHIATRIC: Normal mood and affect. Normal behavior. Normal judgment and thought content. CARDIOVASCULAR: Normal heart rate noted, regular rhythm RESPIRATORY: Clear to auscultation bilaterally. Effort and breath sounds normal, no problems with respiration noted. BREASTS: Symmetric in size. No masses, tenderness, skin changes, nipple drainage, or lymphadenopathy bilaterally. Performed in the presence of a chaperone. ABDOMEN: Soft, no distention noted.  No tenderness, rebound or guarding.  PELVIC: Normal appearing external genitalia and urethral meatus; normal appearing vaginal mucosa and .   Enlarged uterine size, IUD strings palpated. No other palpable masses, no uterine or adnexal tenderness.  Performed in the presence of a chaperone.   Assessment and Plan:   1. History of uterine fibroid  - US PELVIC COMPLETE WITH TRANSVAGINAL; Future - Discussed uterine artery embolization as an option.   2. Encounter for annual routine gynecological examination  - last pap in 3/23 normal  3. Urinary incontinence in female  - discussed Pelvic PT   Mammogram scheduled Colon cancer screening is up to date. Routine preventative health maintenance measures emphasized. Please refer to After Visit Summary for other counseling recommendations.    Morris Markham, [name], [place] for [name], [place]

## 2021-07-22 NOTE — Progress Notes (Signed)
Last mammo- 04/13/21- negative Last pap- 05/27/19- negative

## 2021-07-27 ENCOUNTER — Ambulatory Visit (INDEPENDENT_AMBULATORY_CARE_PROVIDER_SITE_OTHER): Payer: BC Managed Care – PPO

## 2021-07-27 DIAGNOSIS — Z86018 Personal history of other benign neoplasm: Secondary | ICD-10-CM

## 2021-07-27 DIAGNOSIS — N83202 Unspecified ovarian cyst, left side: Secondary | ICD-10-CM | POA: Diagnosis not present

## 2021-07-27 DIAGNOSIS — N83201 Unspecified ovarian cyst, right side: Secondary | ICD-10-CM | POA: Diagnosis not present

## 2021-07-27 DIAGNOSIS — D259 Leiomyoma of uterus, unspecified: Secondary | ICD-10-CM | POA: Diagnosis not present

## 2021-07-27 DIAGNOSIS — Z780 Asymptomatic menopausal state: Secondary | ICD-10-CM | POA: Diagnosis not present

## 2021-07-28 DIAGNOSIS — J301 Allergic rhinitis due to pollen: Secondary | ICD-10-CM | POA: Diagnosis not present

## 2021-08-02 ENCOUNTER — Other Ambulatory Visit: Payer: Self-pay | Admitting: Obstetrics and Gynecology

## 2021-08-02 DIAGNOSIS — Z86018 Personal history of other benign neoplasm: Secondary | ICD-10-CM

## 2021-08-02 DIAGNOSIS — N83209 Unspecified ovarian cyst, unspecified side: Secondary | ICD-10-CM

## 2021-08-02 DIAGNOSIS — J301 Allergic rhinitis due to pollen: Secondary | ICD-10-CM | POA: Diagnosis not present

## 2021-08-03 DIAGNOSIS — M6281 Muscle weakness (generalized): Secondary | ICD-10-CM | POA: Diagnosis not present

## 2021-08-03 DIAGNOSIS — M25551 Pain in right hip: Secondary | ICD-10-CM | POA: Diagnosis not present

## 2021-08-09 DIAGNOSIS — J301 Allergic rhinitis due to pollen: Secondary | ICD-10-CM | POA: Diagnosis not present

## 2021-08-12 DIAGNOSIS — M6281 Muscle weakness (generalized): Secondary | ICD-10-CM | POA: Diagnosis not present

## 2021-08-12 DIAGNOSIS — M25551 Pain in right hip: Secondary | ICD-10-CM | POA: Diagnosis not present

## 2021-08-23 DIAGNOSIS — J301 Allergic rhinitis due to pollen: Secondary | ICD-10-CM | POA: Diagnosis not present

## 2021-09-20 DIAGNOSIS — M25551 Pain in right hip: Secondary | ICD-10-CM | POA: Diagnosis not present

## 2021-09-20 DIAGNOSIS — M6281 Muscle weakness (generalized): Secondary | ICD-10-CM | POA: Diagnosis not present

## 2021-09-20 DIAGNOSIS — J301 Allergic rhinitis due to pollen: Secondary | ICD-10-CM | POA: Diagnosis not present

## 2021-10-04 DIAGNOSIS — J301 Allergic rhinitis due to pollen: Secondary | ICD-10-CM | POA: Diagnosis not present

## 2021-10-24 DIAGNOSIS — J301 Allergic rhinitis due to pollen: Secondary | ICD-10-CM | POA: Diagnosis not present

## 2021-11-22 DIAGNOSIS — J301 Allergic rhinitis due to pollen: Secondary | ICD-10-CM | POA: Diagnosis not present

## 2021-12-05 ENCOUNTER — Ambulatory Visit (INDEPENDENT_AMBULATORY_CARE_PROVIDER_SITE_OTHER): Payer: BC Managed Care – PPO | Admitting: Physician Assistant

## 2021-12-05 ENCOUNTER — Encounter: Payer: Self-pay | Admitting: Physician Assistant

## 2021-12-05 VITALS — BP 142/71 | HR 80 | Ht 64.0 in | Wt 223.0 lb

## 2021-12-05 DIAGNOSIS — R0683 Snoring: Secondary | ICD-10-CM

## 2021-12-05 DIAGNOSIS — M549 Dorsalgia, unspecified: Secondary | ICD-10-CM

## 2021-12-05 DIAGNOSIS — N62 Hypertrophy of breast: Secondary | ICD-10-CM

## 2021-12-05 DIAGNOSIS — E66812 Obesity, class 2: Secondary | ICD-10-CM

## 2021-12-05 DIAGNOSIS — Z23 Encounter for immunization: Secondary | ICD-10-CM

## 2021-12-05 DIAGNOSIS — Z1329 Encounter for screening for other suspected endocrine disorder: Secondary | ICD-10-CM

## 2021-12-05 DIAGNOSIS — I10 Essential (primary) hypertension: Secondary | ICD-10-CM | POA: Diagnosis not present

## 2021-12-05 DIAGNOSIS — Z13220 Encounter for screening for lipoid disorders: Secondary | ICD-10-CM | POA: Diagnosis not present

## 2021-12-05 DIAGNOSIS — Z131 Encounter for screening for diabetes mellitus: Secondary | ICD-10-CM

## 2021-12-05 DIAGNOSIS — Z0000 Encounter for general adult medical examination without abnormal findings: Secondary | ICD-10-CM | POA: Diagnosis not present

## 2021-12-05 DIAGNOSIS — G478 Other sleep disorders: Secondary | ICD-10-CM

## 2021-12-05 DIAGNOSIS — Z6836 Body mass index (BMI) 36.0-36.9, adult: Secondary | ICD-10-CM

## 2021-12-05 DIAGNOSIS — E6609 Other obesity due to excess calories: Secondary | ICD-10-CM

## 2021-12-05 NOTE — Progress Notes (Signed)
Complete physical exam  Patient: Elaine Murillo   DOB: 25-Jul-1970   51 y.o. Female  MRN: [id number]  Subjective:    Chief Complaint  Patient presents with   Annual Exam    Elaine Murillo is a 51 y.o. female who presents today for a complete physical exam. She reports consuming a general diet.  She walks and very active.   She generally feels fairly well. She reports sleeping she sleeps well but wakes up feeling tired. She does have additional problems to discuss today.   She continues to struggle with bilateral large breast and upper back pain. She is a 6 G  Most recent fall risk assessment:    12/05/2021    8:30 AM  [place] in the past year? 0  Number falls in past yr: 0  Injury with Fall? 0  Risk for fall due to : No Fall Risks  Follow up Falls evaluation completed     Most recent depression screenings:    12/05/2021    8:31 AM 12/02/2020    8:31 AM  PHQ 2/9 Scores  PHQ - 2 Score 0 0  PHQ- 9 Score 0 1    Vision:Within last year and Dental: No current dental problems  Patient Active Problem List   Diagnosis Date Noted   White coat syndrome without hypertension 04/18/2021   Primary hypertension 12/02/2020   Obesity (BMI 30-39.9) 06/01/2020   Pain in left toe(s) 03/17/2020   Women's annual routine gynecological examination 12/08/2019   Primary osteoarthritis of right hip 09/04/2019   Hand pain 09/04/2019   IUD (intrauterine device) in place 05/27/2019   Facial flushing 12/08/2018   Abnormal RBC 12/08/2018   ETD (Eustachian tube dysfunction), right 12/03/2018   Macromastia 01/18/2018   Dry skin 12/29/2016   Itching 12/29/2016   Elevated blood pressure reading 06/06/2016   Allergic rhinitis with postnasal drip 06/01/2016   Migraine variant 11/16/2015   Hallux valgus of left foot 02/08/2015   Metatarsalgia of left foot 02/08/2015   Class 2 obesity due to excess calories without serious comorbidity with body mass index (BMI) of 36.0 to 36.9 in adult  02/08/2015   Right-sided low back pain with right-sided sciatica 02/16/2014   Vitamin D insufficiency 02/03/2014   Need for prophylactic vaccination and inoculation against influenza 11/21/2012   WEIGHT GAIN 08/20/2008   Past Medical History:  Diagnosis Date   Allergy    seasonal   Anxiety    Arthritis    Hypertension    Uterine fibroids affecting pregnancy    Family History  Problem Relation Age of Onset   Stroke Paternal Grandmother    Colon cancer Neg Hx    Colon polyps Neg Hx    Esophageal cancer Neg Hx    Rectal cancer Neg Hx    Stomach cancer Neg Hx    No Known Allergies    Patient Care Team: Lavada Mesi as PCP - General (Family Medicine)   Outpatient Medications Prior to Visit  Medication Sig   cetirizine (ZYRTEC) 10 MG tablet Take 10 mg by mouth daily.   levonorgestrel (LILETTA, 52 MG,) 19.5 MCG/DAY IUD IUD 1 each by Intrauterine route once.   Omega-3 Fatty Acids (FISH OIL) 1000 MG CAPS Take 4,000 mg by mouth daily.   SOOLANTRA 1 % CREA Apply topically.   No facility-administered medications prior to visit.    ROS   See HPI.      Objective:     BP Marland Kitchen)  142/71   Pulse 80   Ht '5\' 4"'$  (1.626 m)   Wt 223 lb (101.2 kg)   SpO2 100%   BMI 38.28 kg/m  BP Readings from Last 3 Encounters:  12/05/21 (!) 142/71  07/22/21 (!) 147/88  04/18/21 (!) 148/78   Wt Readings from Last 3 Encounters:  12/05/21 223 lb (101.2 kg)  07/22/21 223 lb (101.2 kg)  12/02/20 212 lb (96.2 kg)      Physical Exam     BP (!) 142/71   Pulse 80   Ht '5\' 4"'$  (1.626 m)   Wt 223 lb (101.2 kg)   SpO2 100%   BMI 38.28 kg/m   General Appearance:    Alert, cooperative, no distress, appears stated age  Head:    Normocephalic, without obvious abnormality, atraumatic  Eyes:    PERRL, conjunctiva/corneas clear, EOM's intact, fundi    benign, both eyes  Ears:    Normal TM's and external ear canals, both ears  Nose:   Nares normal, septum midline, mucosa normal, no  drainage    or sinus tenderness  Throat:   Lips, mucosa, and tongue normal; teeth and gums normal  Neck:   Supple, symmetrical, trachea midline, no adenopathy;    thyroid:  no enlargement/tenderness/nodules; no carotid   bruit or JVD  Back:     Symmetric, no curvature, ROM normal, no CVA tenderness  Lungs:     Clear to auscultation bilaterally, respirations unlabored  Chest Wall:    No tenderness or deformity   Heart:    Regular rate and rhythm, S1 and S2 normal, no murmur, rub   or gallop  Breast Exam:    Large bilateral breast  Abdomen:     Soft, non-tender, bowel sounds active all four quadrants,    no masses, no organomegaly        Extremities:   Extremities normal, atraumatic, no cyanosis or edema  Pulses:   2+ and symmetric all extremities  Skin:   Skin color, texture, turgor normal, no rashes or lesions  Lymph nodes:   Cervical, supraclavicular, and axillary nodes normal  Neurologic:   CNII-XII intact, normal strength, sensation and reflexes    throughout   Assessment & Plan:    Routine Health Maintenance and Physical Exam  Immunization History  Administered Date(s) Administered   Influenza Split 12/18/2011   Influenza,inj,Quad PF,6+ Mos 11/21/2012, 12/10/2013, 11/25/2014, 11/16/2015, 12/27/2016, 12/17/2017, 12/03/2018, 12/03/2019, 12/02/2020, 12/05/2021   Moderna Sars-Covid-2 Vaccination 06/05/2019, 07/03/2019   Tdap 04/10/2012   Zoster Recombinat (Shingrix) 12/02/2020, 03/18/2021    Health Maintenance  Topic Date Due   COVID-19 Vaccine (3 - Moderna series) 12/21/2021 (Originally 08/28/2019)   TETANUS/TDAP  04/10/2022   MAMMOGRAM  04/13/2022   PAP SMEAR-Modifier  05/27/2022   COLONOSCOPY (Pts 45-22yr Insurance coverage will need to be confirmed)  02/09/2030   INFLUENZA VACCINE  Completed   Hepatitis C Screening  Completed   HIV Screening  Completed   Zoster Vaccines- Shingrix  Completed   HPV VACCINES  Aged Out    Discussed health benefits of physical activity,  and encouraged her to engage in regular exercise appropriate for her age and condition.  . Discussed 150 minutes of exercise a week.  Encouraged vitamin D 1000 units and Calcium '1300mg'$  or 4 servings of dairy a day.  PHQ no concerns Fasting labs ordered today Flu shot given today Shingrix UTD Encouraged covid booster Pap and mammogram UTD Colonoscopy UTD   Discussed consult for breast reduction. Pt is ok  with this.   STOP BANG high risk for apnea Home sleep study ordered     Iran Planas, PA-C

## 2021-12-05 NOTE — Patient Instructions (Addendum)

## 2021-12-06 ENCOUNTER — Other Ambulatory Visit: Payer: Self-pay | Admitting: Physician Assistant

## 2021-12-06 DIAGNOSIS — D582 Other hemoglobinopathies: Secondary | ICD-10-CM

## 2021-12-06 DIAGNOSIS — R718 Other abnormality of red blood cells: Secondary | ICD-10-CM

## 2021-12-06 DIAGNOSIS — D751 Secondary polycythemia: Secondary | ICD-10-CM

## 2021-12-06 LAB — COMPLETE METABOLIC PANEL WITH GFR
AG Ratio: 1.5 (calc) (ref 1.0–2.5)
ALT: 15 U/L (ref 6–29)
AST: 15 U/L (ref 10–35)
Albumin: 4.4 g/dL (ref 3.6–5.1)
Alkaline phosphatase (APISO): 82 U/L (ref 37–153)
BUN: 10 mg/dL (ref 7–25)
CO2: 27 mmol/L (ref 20–32)
Calcium: 9.3 mg/dL (ref 8.6–10.4)
Chloride: 102 mmol/L (ref 98–110)
Creat: 0.86 mg/dL (ref 0.50–1.03)
Globulin: 3 g/dL (calc) (ref 1.9–3.7)
Glucose, Bld: 93 mg/dL (ref 65–99)
Potassium: 4.7 mmol/L (ref 3.5–5.3)
Sodium: 137 mmol/L (ref 135–146)
Total Bilirubin: 0.6 mg/dL (ref 0.2–1.2)
Total Protein: 7.4 g/dL (ref 6.1–8.1)
eGFR: 82 mL/min/{1.73_m2} (ref >=60)

## 2021-12-06 LAB — CBC WITH DIFFERENTIAL/PLATELET
Absolute Monocytes: 553 cells/uL (ref 200–950)
Basophils Absolute: 77 cells/uL (ref 0–200)
Basophils Relative: 0.9 %
Eosinophils Absolute: 196 cells/uL (ref 15–500)
Eosinophils Relative: 2.3 %
HCT: 46.8 % — ABNORMAL HIGH (ref 35.0–45.0)
Hemoglobin: 15.9 g/dL — ABNORMAL HIGH (ref 11.7–15.5)
Lymphs Abs: 2584 cells/uL (ref 850–3900)
MCH: 30.6 pg (ref 27.0–33.0)
MCHC: 34 g/dL (ref 32.0–36.0)
MCV: 90.2 fL (ref 80.0–100.0)
MPV: 9.7 fL (ref 7.5–12.5)
Monocytes Relative: 6.5 %
Neutro Abs: 5092 cells/uL (ref 1500–7800)
Neutrophils Relative %: 59.9 %
Platelets: 323 10*3/uL (ref 140–400)
RBC: 5.19 10*6/uL — ABNORMAL HIGH (ref 3.80–5.10)
RDW: 12.4 % (ref 11.0–15.0)
Total Lymphocyte: 30.4 %
WBC: 8.5 10*3/uL (ref 3.8–10.8)

## 2021-12-06 LAB — LIPID PANEL W/REFLEX DIRECT LDL
Cholesterol: 213 mg/dL — ABNORMAL HIGH (ref <200)
HDL: 53 mg/dL (ref >=50)
LDL Cholesterol (Calc): 127 mg/dL (calc) — ABNORMAL HIGH
Non-HDL Cholesterol (Calc): 160 mg/dL (calc) — ABNORMAL HIGH (ref <130)
Total CHOL/HDL Ratio: 4 (calc) (ref <5.0)
Triglycerides: 193 mg/dL — ABNORMAL HIGH (ref <150)

## 2021-12-06 LAB — TSH: TSH: 1.64 mIU/L

## 2021-12-06 NOTE — Progress Notes (Signed)
Kidney, liver, glucose look great.  Thyroid is normal.  WBC is great.  HDL, good cholesterol, did improve.  LDL stayed the same.  Overall 10 year risk si 1.9 percent and still low.   Marland Kitchen.The 10-year ASCVD risk score (Arnett DK, et al., 2019) is: 1.9%   Values used to calculate the score:     Age: 51 years     Sex: Female     Is Non-Hispanic African American: No     Diabetic: No     Tobacco smoker: No     Systolic Blood Pressure: 683 mmHg     Is BP treated: No     HDL Cholesterol: 53 mg/dL     Total Cholesterol: 213 mg/dL  Hemoglobin/hematocrit/RBC elevated. Your bone marrow is making too many red blood cells. We call this polycythemia. I do not see any secondary reasons why you should be doing this. You have been borderline in the past. I do want to send you to hematology for genetic work up for primary polycythemia vera.

## 2021-12-12 DIAGNOSIS — L719 Rosacea, unspecified: Secondary | ICD-10-CM | POA: Diagnosis not present

## 2021-12-12 DIAGNOSIS — L299 Pruritus, unspecified: Secondary | ICD-10-CM | POA: Diagnosis not present

## 2021-12-12 DIAGNOSIS — L570 Actinic keratosis: Secondary | ICD-10-CM | POA: Diagnosis not present

## 2021-12-14 ENCOUNTER — Institutional Professional Consult (permissible substitution): Payer: BC Managed Care – PPO | Admitting: Plastic Surgery

## 2021-12-19 ENCOUNTER — Other Ambulatory Visit: Payer: Self-pay | Admitting: Family

## 2021-12-19 DIAGNOSIS — D751 Secondary polycythemia: Secondary | ICD-10-CM

## 2021-12-20 ENCOUNTER — Inpatient Hospital Stay (HOSPITAL_BASED_OUTPATIENT_CLINIC_OR_DEPARTMENT_OTHER): Payer: BC Managed Care – PPO | Admitting: Family

## 2021-12-20 ENCOUNTER — Encounter: Payer: Self-pay | Admitting: Family

## 2021-12-20 ENCOUNTER — Inpatient Hospital Stay: Payer: BC Managed Care – PPO | Attending: Hematology & Oncology

## 2021-12-20 VITALS — BP 155/86 | HR 82 | Temp 98.7°F | Resp 18 | Ht 64.25 in | Wt 227.1 lb

## 2021-12-20 DIAGNOSIS — Z86018 Personal history of other benign neoplasm: Secondary | ICD-10-CM | POA: Insufficient documentation

## 2021-12-20 DIAGNOSIS — D751 Secondary polycythemia: Secondary | ICD-10-CM | POA: Diagnosis not present

## 2021-12-20 DIAGNOSIS — Z96641 Presence of right artificial hip joint: Secondary | ICD-10-CM | POA: Insufficient documentation

## 2021-12-20 DIAGNOSIS — J301 Allergic rhinitis due to pollen: Secondary | ICD-10-CM | POA: Diagnosis not present

## 2021-12-20 DIAGNOSIS — Z808 Family history of malignant neoplasm of other organs or systems: Secondary | ICD-10-CM | POA: Insufficient documentation

## 2021-12-20 DIAGNOSIS — I10 Essential (primary) hypertension: Secondary | ICD-10-CM | POA: Insufficient documentation

## 2021-12-20 LAB — CBC WITH DIFFERENTIAL (CANCER CENTER ONLY)
Abs Immature Granulocytes: 0.06 10*3/uL (ref 0.00–0.07)
Basophils Absolute: 0.1 10*3/uL (ref 0.0–0.1)
Basophils Relative: 1 %
Eosinophils Absolute: 0.2 10*3/uL (ref 0.0–0.5)
Eosinophils Relative: 2 %
HCT: 46.2 % — ABNORMAL HIGH (ref 36.0–46.0)
Hemoglobin: 15.4 g/dL — ABNORMAL HIGH (ref 12.0–15.0)
Immature Granulocytes: 1 %
Lymphocytes Relative: 33 %
Lymphs Abs: 3.7 10*3/uL (ref 0.7–4.0)
MCH: 29.9 pg (ref 26.0–34.0)
MCHC: 33.3 g/dL (ref 30.0–36.0)
MCV: 89.7 fL (ref 80.0–100.0)
Monocytes Absolute: 0.6 10*3/uL (ref 0.1–1.0)
Monocytes Relative: 5 %
Neutro Abs: 6.4 10*3/uL (ref 1.7–7.7)
Neutrophils Relative %: 58 %
Platelet Count: 282 10*3/uL (ref 150–400)
RBC: 5.15 MIL/uL — ABNORMAL HIGH (ref 3.87–5.11)
RDW: 12.6 % (ref 11.5–15.5)
WBC Count: 11.1 10*3/uL — ABNORMAL HIGH (ref 4.0–10.5)
nRBC: 0 % (ref 0.0–0.2)

## 2021-12-20 LAB — CMP (CANCER CENTER ONLY)
ALT: 17 U/L (ref 0–44)
AST: 21 U/L (ref 15–41)
Albumin: 4.1 g/dL (ref 3.5–5.0)
Alkaline Phosphatase: 75 U/L (ref 38–126)
Anion gap: 7 (ref 5–15)
BUN: 11 mg/dL (ref 6–20)
CO2: 27 mmol/L (ref 22–32)
Calcium: 9.4 mg/dL (ref 8.9–10.3)
Chloride: 103 mmol/L (ref 98–111)
Creatinine: 0.84 mg/dL (ref 0.44–1.00)
GFR, Estimated: >60 mL/min (ref >60)
Glucose, Bld: 125 mg/dL — ABNORMAL HIGH (ref 70–99)
Potassium: 4 mmol/L (ref 3.5–5.1)
Sodium: 137 mmol/L (ref 135–145)
Total Bilirubin: 0.5 mg/dL (ref 0.3–1.2)
Total Protein: 7.6 g/dL (ref 6.5–8.1)

## 2021-12-20 LAB — SAVE SMEAR(SSMR), FOR PROVIDER SLIDE REVIEW

## 2021-12-20 LAB — LACTATE DEHYDROGENASE: LDH: 177 U/L (ref 98–192)

## 2021-12-20 LAB — RETICULOCYTES
Immature Retic Fract: 10.9 % (ref 2.3–15.9)
RBC.: 5 MIL/uL (ref 3.87–5.11)
Retic Count, Absolute: 89.5 10*3/uL (ref 19.0–186.0)
Retic Ct Pct: 1.8 % (ref 0.4–3.1)

## 2021-12-20 LAB — FERRITIN: Ferritin: 28 ng/mL (ref 11–307)

## 2021-12-21 LAB — IRON AND IRON BINDING CAPACITY (CC-WL,HP ONLY)
Iron: 106 ug/dL (ref 28–170)
Saturation Ratios: 25 % (ref 10.4–31.8)
TIBC: 428 ug/dL (ref 250–450)
UIBC: 322 ug/dL (ref 148–442)

## 2021-12-21 LAB — ERYTHROPOIETIN: Erythropoietin: 8.1 m[IU]/mL (ref 2.6–18.5)

## 2021-12-21 NOTE — Progress Notes (Signed)
Hematology/Oncology Consultation   Name: Elaine Murillo      MRN: [id number]    Location: Room/bed info not found  Date: 12/21/2021 Time:12:42 PM   REFERRING PHYSICIAN: Iran Planas, PA-C  REASON FOR CONSULT: Erythrocytosis    DIAGNOSIS: Erythrocytosis   HISTORY OF PRESENT ILLNESS: Elaine Murillo is a very pleasant 51 yo caucasian female with mild erythrocytosis over the last month.  Hgb is 15.4 and Hct 46.2%. Platelets 282 and WBC count 11.1.  Her complexion is [place] and she notes some fatigue after work.  No hormone supplementation use.  She has not yet been tested for sleep apnea.  She has an IUD in place and her cycles is regular with light flow. She has history of uterine fibroids.  No other blood loss noted. No abnormal bruising, no petechiae.  Her father had to have a splenectomy but she is unsure why.  Her spleen is in place. No history of liver or heart disease.  No history of diabetes or thyroid disease.  She has had a precancerous lesion removed from her right cheek. Her paternal great uncle had skin cancer.  She stay active working out several days a week at [employer].  She had a right hip replacement in February of this yea. She states that this was much needed and went well.  No problems with frequent or recurrent infections. No fever, chills, n/v, cough, rash, dizziness, SOB, chest pain, palpitations, abdominal pain or changes in bowel or bladder habits.  No swelling, tenderness, numbness or tingling in her extremities at this time.  She states that her feet hurt due to bunions and she has an appointment coming up with a podiatrist.  Appetite and hydration are good. Weight is stable at 227 lbs.  No smoking, ETOH or recreational drug use.  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   ROS: All other 10 point review of systems is negative.   PAST MEDICAL HISTORY:   Past Medical History:  Diagnosis Date    Allergy    seasonal   Anxiety    Arthritis    Hypertension    Uterine fibroids affecting pregnancy     ALLERGIES: No Known Allergies    MEDICATIONS:  Current Outpatient Medications on File Prior to Visit  Medication Sig Dispense Refill   cetirizine (ZYRTEC) 10 MG tablet Take 10 mg by mouth daily.     levonorgestrel (LILETTA, 52 MG,) 19.5 MCG/DAY IUD IUD 1 each by Intrauterine route once.     Omega-3 Fatty Acids (FISH OIL) 1000 MG CAPS Take 4,000 mg by mouth daily.     SOOLANTRA 1 % CREA Apply topically.     No current facility-administered medications on file prior to visit.     PAST SURGICAL HISTORY Past Surgical History:  Procedure Laterality Date   TONSILLECTOMY      FAMILY HISTORY: Family History  Problem Relation Age of Onset   Stroke Paternal Grandmother    Colon cancer Neg Hx    Colon polyps Neg Hx    Esophageal cancer Neg Hx    Rectal cancer Neg Hx    Stomach cancer Neg Hx     SOCIAL HISTORY:  reports that she has never smoked. She has never used smokeless tobacco. She reports that she does not drink alcohol and does not use drugs.  PERFORMANCE STATUS: The patient's performance status is 1 - Symptomatic but completely ambulatory  PHYSICAL EXAM: Most  Recent Vital Signs: Blood pressure (!) 155/86, pulse 82, temperature 98.7 F (37.1 C), temperature source Oral, resp. rate 18, height 5' 4.25" (1.632 m), weight 227 lb 1.9 oz (103 kg), SpO2 99 %. BP (!) 155/86 (BP Location: Right Arm, Patient Position: Sitting)   Pulse 82   Temp 98.7 F (37.1 C) (Oral)   Resp 18   Ht 5' 4.25" (1.632 m)   Wt 227 lb 1.9 oz (103 kg)   SpO2 99%   BMI 38.68 kg/m   General Appearance:    Alert, cooperative, no distress, appears stated age  Head:    Normocephalic, without obvious abnormality, atraumatic  Eyes:    PERRL, conjunctiva/corneas clear, EOM's intact, fundi    benign, both eyes        Throat:   Lips, mucosa, and tongue normal; teeth and gums normal  Neck:    Supple, symmetrical, trachea midline, no adenopathy;    thyroid:  no enlargement/tenderness/nodules; no carotid   bruit or JVD  Back:     Symmetric, no curvature, ROM normal, no CVA tenderness  Lungs:     Clear to auscultation bilaterally, respirations unlabored  Chest Wall:    No tenderness or deformity   Heart:    Regular rate and rhythm, S1 and S2 normal, no murmur, rub   or gallop     Abdomen:     Soft, non-tender, bowel sounds active all four quadrants,    no masses, no organomegaly        Extremities:   Extremities normal, atraumatic, no cyanosis or edema  Pulses:   2+ and symmetric all extremities  Skin:   Skin color, texture, turgor normal, no rashes or lesions  Lymph nodes:   Cervical, supraclavicular, and axillary nodes normal  Neurologic:   CNII-XII intact, normal strength, sensation and reflexes    throughout    LABORATORY DATA:  Results for orders placed or performed in visit on 12/20/21 (from the past 48 hour(s))  CBC with Differential (Can[facility] Center Only)     Status: Abnormal   Collection Time: 12/20/21  2:49 PM  Result Value Ref Range   WBC Count 11.1 (H) 4.0 - 10.5 K/uL   RBC 5.15 (H) 3.87 - 5.11 MIL/uL   Hemoglobin 15.4 (H) 12.0 - 15.0 g/dL   HCT 46.2 (H) 36.0 - 46.0 %   MCV 89.7 80.0 - 100.0 fL   MCH 29.9 26.0 - 34.0 pg   MCHC 33.3 30.0 - 36.0 g/dL   RDW 12.6 11.5 - 15.5 %   Platelet Count 282 150 - 400 K/uL   nRBC 0.0 0.0 - 0.2 %   Neutrophils Relative % 58 %   Neutro Abs 6.4 1.7 - 7.7 K/uL   Lymphocytes Relative 33 %   Lymphs Abs 3.7 0.7 - 4.0 K/uL   Monocytes Relative 5 %   Monocytes Absolute 0.6 0.1 - 1.0 K/uL   Eosinophils Relative 2 %   Eosinophils Absolute 0.2 0.0 - 0.5 K/uL   Basophils Relative 1 %   Basophils Absolute 0.1 0.0 - 0.1 K/uL   Immature Granulocytes 1 %   Abs Immature Granulocytes 0.06 0.00 - 0.07 K/uL    Comment: Performed at [facility] at [facility], [address] [place], [place] [address]  CMP  ([place] only)     Status: Abnormal   Collection Time: 12/20/21  2:49 PM  Result Value Ref Range   Sodium 137 135 - 145 mmol/L  Potassium 4.0 3.5 - 5.1 mmol/L   Chloride 103 98 - 111 mmol/L   CO2 27 22 - 32 mmol/L   Glucose, Bld 125 (H) 70 - 99 mg/dL    Comment: Glucose reference range applies only to samples taken after fasting for at least 8 hours.   BUN 11 6 - 20 mg/dL   Creatinine 0.84 0.44 - 1.00 mg/dL   Calcium 9.4 8.9 - 10.3 mg/dL   Total Protein 7.6 6.5 - 8.1 g/dL   Albumin 4.1 3.5 - 5.0 g/dL   AST 21 15 - 41 U/L   ALT 17 0 - 44 U/L   Alkaline Phosphatase 75 38 - 126 U/L   Total Bilirubin 0.5 0.3 - 1.2 mg/dL   GFR, Estimated >60 >60 mL/min    Comment: (NOTE) Calculated using the CKD-EPI Creatinine Equation (2021)    Anion gap 7 5 - 15    Comment: Performed at [facility], [address] [place], [place] [address]  Iron and Iron Binding Capacity ([facility] only)     Status: None   Collection Time: 12/20/21  2:49 PM  Result Value Ref Range   Iron 106 28 - 170 ug/dL   TIBC 428 250 - 450 ug/dL   Saturation Ratios 25 10.4 - 31.8 %   UIBC 322 148 - 442 ug/dL    Comment: Performed at [facility], [place] [address] [place], [place] [address]  Save Smear for Provider Slide Review     Status: None   Collection Time: 12/20/21  2:49 PM  Result Value Ref Range   Smear Review SMEAR STAINED AND AVAILABLE FOR REVIEW     Comment: Performed at [facility] at [facility], [address], [place], [place] [address]  Erythropoietin     Status: None   Collection Time: 12/20/21  2:49 PM  Result Value Ref Range   Erythropoietin 8.1 2.6 - 18.5 mIU/mL    Comment: (NOTE) [employer] UniCel DxI 800 Immunoassay System Values obtained with different assay methods or kits cannot be used interchangeably. Results cannot be interpreted as absolute evidence of the presence or absence of malignant disease. Performed At: [facility] [place], [place] [id number] Rush Farmer MD EG:[id number]   Ferritin     Status: None   Collection Time: 12/20/21  2:50 PM  Result Value Ref Range   Ferritin 28 11 - 307 ng/mL    Comment: Performed at [facility], [address], [place], [place] [address]  Lactate dehydrogenase (LDH)     Status: None   Collection Time: 12/20/21  2:50 PM  Result Value Ref Range   LDH 177 98 - 192 U/L    Comment: Performed at [facility], [place] [address], [place], [place] [address]  Reticulocytes     Status: None   Collection Time: 12/20/21  2:50 PM  Result Value Ref Range   Retic Ct Pct 1.8 0.4 - 3.1 %   RBC. 5.00 3.87 - 5.11 MIL/uL   Retic Count, Absolute 89.5 19.0 - 186.0 K/uL   Immature Retic Fract 10.9 2.3 - 15.9 %    Comment: Performed at [facility] at [facility], [address] [place], [place] [address]      RADIOGRAPHY: No results found.     PATHOLOGY: None  ASSESSMENT/PLAN: Ms. Mcevers is a very pleasant 51 yo caucasian female with mild erythrocytosis  over the last month.  Iron studies are stable.  Blood smear reviewed with Dr. Marin Olp and no abnormality of evidence of malignancy noted.  JAK 2 is pending.  If lab work up negative we would suggest she move forward with sleep study to rule out sleep apnea as a cause.  Follow-up pending results.   All questions were answered. The patient knows to call the clinic with any problems, questions or concerns. We can certainly see the patient much sooner if necessary.  The patient was discussed with Dr. Marin Olp and he is in agreement with the aforementioned.   Lottie Dawson, NP

## 2021-12-26 ENCOUNTER — Telehealth: Payer: Self-pay | Admitting: *Deleted

## 2021-12-26 NOTE — Telephone Encounter (Signed)
Per 12/20/21 los - Pending lab results.

## 2021-12-27 ENCOUNTER — Telehealth: Payer: Self-pay | Admitting: Plastic Surgery

## 2021-12-27 NOTE — Telephone Encounter (Signed)
LVM and My Chart message to change appt time from 2:30 to 2:45 and to please call to confirm.

## 2021-12-28 LAB — JAK2 (INCLUDING V617F AND EXON 12), MPL,& CALR-NEXT GEN SEQ

## 2022-01-04 ENCOUNTER — Other Ambulatory Visit: Payer: Self-pay | Admitting: Family

## 2022-01-10 DIAGNOSIS — M7742 Metatarsalgia, left foot: Secondary | ICD-10-CM | POA: Diagnosis not present

## 2022-01-10 DIAGNOSIS — M2011 Hallux valgus (acquired), right foot: Secondary | ICD-10-CM | POA: Diagnosis not present

## 2022-01-10 DIAGNOSIS — M2012 Hallux valgus (acquired), left foot: Secondary | ICD-10-CM | POA: Diagnosis not present

## 2022-01-10 DIAGNOSIS — M7741 Metatarsalgia, right foot: Secondary | ICD-10-CM | POA: Diagnosis not present

## 2022-01-13 ENCOUNTER — Institutional Professional Consult (permissible substitution): Payer: BC Managed Care – PPO | Admitting: Plastic Surgery

## 2022-01-25 DIAGNOSIS — J301 Allergic rhinitis due to pollen: Secondary | ICD-10-CM | POA: Diagnosis not present

## 2022-02-09 ENCOUNTER — Encounter: Payer: Self-pay | Admitting: Plastic Surgery

## 2022-02-09 ENCOUNTER — Ambulatory Visit (INDEPENDENT_AMBULATORY_CARE_PROVIDER_SITE_OTHER): Payer: BC Managed Care – PPO | Admitting: Plastic Surgery

## 2022-02-09 VITALS — BP 149/86 | HR 86 | Ht 64.5 in | Wt 226.0 lb

## 2022-02-09 DIAGNOSIS — M542 Cervicalgia: Secondary | ICD-10-CM | POA: Diagnosis not present

## 2022-02-09 DIAGNOSIS — M546 Pain in thoracic spine: Secondary | ICD-10-CM | POA: Diagnosis not present

## 2022-02-09 DIAGNOSIS — Z6838 Body mass index (BMI) 38.0-38.9, adult: Secondary | ICD-10-CM

## 2022-02-09 DIAGNOSIS — N62 Hypertrophy of breast: Secondary | ICD-10-CM | POA: Diagnosis not present

## 2022-02-09 NOTE — Progress Notes (Signed)
   Referring Provider Donella Stade, PA-C [place],  [place] [address]   CC:  Chief Complaint  Patient presents with   Advice Only      Elaine Murillo is an 51 y.o. female.  HPI: Elaine Murillo is a very pleasant 51 year old female who presents today for discussion of a breast reduction.  Patient says that she is unhappy with the size of her breast.  She is not sure that she wants to undergo a breast reduction.  She has no other specific complaints although she says she does have occasional upper back and neck pain.  No Known Allergies  Outpatient Encounter Medications as of 02/09/2022  Medication Sig   cetirizine (ZYRTEC) 10 MG tablet Take 10 mg by mouth daily.   levonorgestrel (LILETTA, 52 MG,) 19.5 MCG/DAY IUD IUD 1 each by Intrauterine route once.   Omega-3 Fatty Acids (FISH OIL) 1000 MG CAPS Take 4,000 mg by mouth daily.   SOOLANTRA 1 % CREA Apply topically.   No facility-administered encounter medications on file as of 02/09/2022.     Past Medical History:  Diagnosis Date   Allergy    seasonal   Anxiety    Arthritis    Hypertension    Uterine fibroids affecting pregnancy     Past Surgical History:  Procedure Laterality Date   TONSILLECTOMY      Family History  Problem Relation Age of Onset   Stroke Paternal Grandmother    Colon cancer Neg Hx    Colon polyps Neg Hx    Esophageal cancer Neg Hx    Rectal cancer Neg Hx    Stomach cancer Neg Hx     Social History   Social History Narrative   Not on file     Review of Systems General: Denies fevers, chills, weight loss CV: Denies chest pain, shortness of breath, palpitations Breast: Unhappy with the size of her breast.  No other specific complaints related to her breast.  Physical Exam    02/09/2022   10:00 AM 12/20/2021    3:06 PM 12/05/2021    8:19 AM  Vitals with BMI  Height 5' 4.5" 5' 4.25" '5\' 4"'$   Weight 226 lbs 227 lbs 2 oz 223 lbs  BMI 38.21 41.58 30.94  Systolic 076  808 811  Diastolic 86 86 71  Pulse 86 82 80    General:  No acute distress,  Alert and oriented, Non-Toxic, Normal speech and affect Breast: Patient does have large breasts bilaterally with grade 2 ptosis. Mammogram: Mammogram performed in February 2023 was BI-RADS 1 Assessment/Plan Macromastia: The patient is not sure that she underwent to go a breast reduction at this time.  We discussed other options which primarily include weight loss.  We also discussed healthy eating and exercise strategies.  She will let me know if she is interested in a breast reduction in the future.  We did discuss the procedure including the location of the incisions and the unpredictable nature of scarring as well as the complications including but not limited to bleeding infection seroma formation and loss of the nipple due to loss of blood supply.  She may return to the clinic as needed.  Camillia Herter 02/09/2022, 12:39 PM

## 2022-02-14 ENCOUNTER — Ambulatory Visit (INDEPENDENT_AMBULATORY_CARE_PROVIDER_SITE_OTHER): Payer: BC Managed Care – PPO

## 2022-02-14 DIAGNOSIS — D251 Intramural leiomyoma of uterus: Secondary | ICD-10-CM | POA: Diagnosis not present

## 2022-02-14 DIAGNOSIS — N83202 Unspecified ovarian cyst, left side: Secondary | ICD-10-CM | POA: Diagnosis not present

## 2022-02-14 DIAGNOSIS — N859 Noninflammatory disorder of uterus, unspecified: Secondary | ICD-10-CM | POA: Diagnosis not present

## 2022-02-14 DIAGNOSIS — Z86018 Personal history of other benign neoplasm: Secondary | ICD-10-CM

## 2022-02-14 DIAGNOSIS — N83209 Unspecified ovarian cyst, unspecified side: Secondary | ICD-10-CM

## 2022-02-22 DIAGNOSIS — J301 Allergic rhinitis due to pollen: Secondary | ICD-10-CM | POA: Diagnosis not present

## 2022-03-14 DIAGNOSIS — M7742 Metatarsalgia, left foot: Secondary | ICD-10-CM | POA: Diagnosis not present

## 2022-03-14 DIAGNOSIS — M778 Other enthesopathies, not elsewhere classified: Secondary | ICD-10-CM | POA: Diagnosis not present

## 2022-03-14 DIAGNOSIS — M7741 Metatarsalgia, right foot: Secondary | ICD-10-CM | POA: Diagnosis not present

## 2022-03-14 DIAGNOSIS — M2011 Hallux valgus (acquired), right foot: Secondary | ICD-10-CM | POA: Diagnosis not present

## 2022-03-14 DIAGNOSIS — M2012 Hallux valgus (acquired), left foot: Secondary | ICD-10-CM | POA: Diagnosis not present

## 2022-03-15 ENCOUNTER — Other Ambulatory Visit: Payer: Self-pay | Admitting: Advanced Practice Midwife

## 2022-03-15 DIAGNOSIS — Z1231 Encounter for screening mammogram for malignant neoplasm of breast: Secondary | ICD-10-CM

## 2022-03-21 DIAGNOSIS — J301 Allergic rhinitis due to pollen: Secondary | ICD-10-CM | POA: Diagnosis not present

## 2022-04-17 DIAGNOSIS — J301 Allergic rhinitis due to pollen: Secondary | ICD-10-CM | POA: Diagnosis not present

## 2022-05-03 ENCOUNTER — Ambulatory Visit (INDEPENDENT_AMBULATORY_CARE_PROVIDER_SITE_OTHER): Payer: BC Managed Care – PPO

## 2022-05-03 DIAGNOSIS — Z1231 Encounter for screening mammogram for malignant neoplasm of breast: Secondary | ICD-10-CM

## 2022-05-08 DIAGNOSIS — J301 Allergic rhinitis due to pollen: Secondary | ICD-10-CM | POA: Diagnosis not present

## 2022-05-29 DIAGNOSIS — J301 Allergic rhinitis due to pollen: Secondary | ICD-10-CM | POA: Diagnosis not present

## 2022-06-12 ENCOUNTER — Ambulatory Visit: Payer: BC Managed Care – PPO | Admitting: Sports Medicine

## 2022-06-12 DIAGNOSIS — J301 Allergic rhinitis due to pollen: Secondary | ICD-10-CM | POA: Diagnosis not present

## 2022-06-19 ENCOUNTER — Encounter: Payer: Self-pay | Admitting: *Deleted

## 2022-06-26 DIAGNOSIS — J301 Allergic rhinitis due to pollen: Secondary | ICD-10-CM | POA: Diagnosis not present

## 2022-07-10 DIAGNOSIS — J301 Allergic rhinitis due to pollen: Secondary | ICD-10-CM | POA: Diagnosis not present

## 2022-07-25 DIAGNOSIS — J301 Allergic rhinitis due to pollen: Secondary | ICD-10-CM | POA: Diagnosis not present

## 2022-08-08 DIAGNOSIS — J301 Allergic rhinitis due to pollen: Secondary | ICD-10-CM | POA: Diagnosis not present

## 2022-08-22 DIAGNOSIS — L299 Pruritus, unspecified: Secondary | ICD-10-CM | POA: Diagnosis not present

## 2022-08-22 DIAGNOSIS — R058 Other specified cough: Secondary | ICD-10-CM | POA: Diagnosis not present

## 2022-08-22 DIAGNOSIS — L209 Atopic dermatitis, unspecified: Secondary | ICD-10-CM | POA: Diagnosis not present

## 2022-08-22 DIAGNOSIS — R232 Flushing: Secondary | ICD-10-CM | POA: Diagnosis not present

## 2022-08-22 DIAGNOSIS — J301 Allergic rhinitis due to pollen: Secondary | ICD-10-CM | POA: Diagnosis not present

## 2022-09-12 DIAGNOSIS — J301 Allergic rhinitis due to pollen: Secondary | ICD-10-CM | POA: Diagnosis not present

## 2022-10-10 DIAGNOSIS — J301 Allergic rhinitis due to pollen: Secondary | ICD-10-CM | POA: Diagnosis not present

## 2022-10-24 DIAGNOSIS — J301 Allergic rhinitis due to pollen: Secondary | ICD-10-CM | POA: Diagnosis not present

## 2022-11-07 DIAGNOSIS — J301 Allergic rhinitis due to pollen: Secondary | ICD-10-CM | POA: Diagnosis not present

## 2022-11-14 ENCOUNTER — Ambulatory Visit (INDEPENDENT_AMBULATORY_CARE_PROVIDER_SITE_OTHER): Payer: BC Managed Care – PPO | Admitting: Obstetrics and Gynecology

## 2022-11-14 ENCOUNTER — Other Ambulatory Visit (HOSPITAL_COMMUNITY)
Admission: RE | Admit: 2022-11-14 | Discharge: 2022-11-14 | Disposition: A | Discharge status: Home / Self Care | Payer: BC Managed Care – PPO | Source: Ambulatory Visit | Attending: Obstetrics and Gynecology | Admitting: Obstetrics and Gynecology

## 2022-11-14 ENCOUNTER — Encounter: Payer: Self-pay | Admitting: Obstetrics and Gynecology

## 2022-11-14 VITALS — BP 147/92 | HR 86 | Ht 64.5 in | Wt 220.0 lb

## 2022-11-14 DIAGNOSIS — Z01419 Encounter for gynecological examination (general) (routine) without abnormal findings: Secondary | ICD-10-CM | POA: Insufficient documentation

## 2022-11-14 DIAGNOSIS — I10 Essential (primary) hypertension: Secondary | ICD-10-CM

## 2022-11-14 NOTE — Progress Notes (Signed)
GYNECOLOGY ANNUAL PREVENTATIVE CARE ENCOUNTER NOTE  History:     Elaine Murillo is a 52 y.o. [id number] female here for a routine annual gynecologic exam.  Current complaints: none.   Denies abnormal vaginal bleeding, discharge, pelvic pain, problems with intercourse or other gynecologic concerns.    Gynecologic History No LMP recorded. (Menstrual status: IUD). Still having several bleeding cycles per year.  Contraception: IUD Last Pap: 2021. Result was normal with negative HPV Last Mammogram: 2024.  Result was normal Last Colonoscopy: 2021.  Result was normal  Obstetric History OB History  Gravida Para Term Preterm AB Living  2 2 2     2   SAB IAB Ectopic Multiple Live Births          2    # Outcome Date GA Lbr Len/2nd Weight Sex Type Anes PTL Lv  2 Term 06/23/12 [redacted]w[redacted]d 01:48 / 00:04 9 lb 6 oz (4.252 kg) M Vag-Spont None  LIV  1 Term 10/22/07 [redacted]w[redacted]d  7 lb 1 oz (3.204 kg) F Vag-Spont EPI N LIV    Past Medical History:  Diagnosis Date   Allergy    seasonal   Anxiety    Arthritis    Hypertension    Uterine fibroids affecting pregnancy     Past Surgical History:  Procedure Laterality Date   TONSILLECTOMY      Current Outpatient Medications on File Prior to Visit  Medication Sig Dispense Refill   cetirizine (ZYRTEC) 10 MG tablet Take 10 mg by mouth daily.     levonorgestrel (LILETTA, 52 MG,) 19.5 MCG/DAY IUD IUD 1 each by Intrauterine route once.     Omega-3 Fatty Acids (FISH OIL) 1000 MG CAPS Take 4,000 mg by mouth daily.     SOOLANTRA 1 % CREA Apply topically. (Patient not taking: Reported on 11/14/2022)     No current facility-administered medications on file prior to visit.    No Known Allergies  Social History:  reports that she has never smoked. She has never used smokeless tobacco. She reports that she does not drink alcohol and does not use drugs.  Family History  Problem Relation Age of Onset   Stroke Paternal Grandmother    Colon cancer Neg Hx    Colon  polyps Neg Hx    Esophageal cancer Neg Hx    Rectal cancer Neg Hx    Stomach cancer Neg Hx     The following portions of the patient's history were reviewed and updated as appropriate: allergies, current medications, past family history, past medical history, past social history, past surgical history and problem list.  Review of Systems Pertinent items noted in HPI and remainder of comprehensive ROS otherwise negative.  Physical Exam:  BP (!) 147/92   Pulse 86   Ht 5' 4.5" (1.638 m)   Wt 220 lb (99.8 kg)   BMI 37.18 kg/m  CONSTITUTIONAL: Well-developed, well-nourished female in no acute distress.  HENT:  Normocephalic, atraumatic, External right and left ear normal.  EYES: Conjunctivae and EOM are normal. Pupils are equal, round, and reactive to light. No scleral icterus.  NECK: Normal range of motion, supple, no masses.  Normal thyroid.  SKIN: Skin is warm and dry. No rash noted. Not diaphoretic. No erythema. No pallor. MUSCULOSKELETAL: Normal range of motion. No tenderness.  No cyanosis, clubbing, or edema. NEUROLOGIC: Alert and oriented to person, place, and time. Normal reflexes, muscle tone coordination.  PSYCHIATRIC: Normal mood and affect. Normal behavior. Normal judgment and thought content. CARDIOVASCULAR: Normal  heart rate noted, regular rhythm RESPIRATORY: Clear to auscultation bilaterally. Effort and breath sounds normal, no problems with respiration noted. BREASTS: Symmetric in size. No masses, tenderness, skin changes, nipple drainage, or lymphadenopathy bilaterally. Performed in the presence of a chaperone. ABDOMEN: Soft, no distention noted.  No tenderness, rebound or guarding.  PELVIC: Normal appearing external genitalia and urethral meatus; normal appearing vaginal mucosa and cervix.  No abnormal vaginal discharge noted.  Pap smear obtained.  Normal uterine size, no other palpable masses, no uterine or adnexal tenderness.  Performed in the presence of a chaperone.    Assessment and Plan:   1. Women's annual routine gynecological examination  - Cytology - PAP( [facility])  2. Primary hypertension  Has PCP. PCP has recommended BP management. Patient uncertain of taking medications She will start monitoring BP at home and keep a log for review.   Will follow up results of pap smear and manage accordingly. Normal breast examination today, she was advised to perform periodic self breast examinations.  Mammogram scheduled for breast cancer screening. Colon cancer screening is up to date Routine preventative health maintenance measures emphasized. Please refer to After Visit Summary for other counseling recommendations.      Barack Nicodemus, Harolyn Rutherford, NP [facility] [employer], [facility]

## 2022-11-16 LAB — CYTOLOGY - PAP
Comment: NEGATIVE
Diagnosis: NEGATIVE
High risk HPV: NEGATIVE

## 2022-11-20 ENCOUNTER — Encounter: Payer: Self-pay | Admitting: Physician Assistant

## 2022-11-20 DIAGNOSIS — E6609 Other obesity due to excess calories: Secondary | ICD-10-CM

## 2022-11-20 DIAGNOSIS — G478 Other sleep disorders: Secondary | ICD-10-CM

## 2022-11-20 DIAGNOSIS — R0683 Snoring: Secondary | ICD-10-CM

## 2022-11-21 DIAGNOSIS — G478 Other sleep disorders: Secondary | ICD-10-CM | POA: Insufficient documentation

## 2022-11-21 DIAGNOSIS — R0683 Snoring: Secondary | ICD-10-CM | POA: Insufficient documentation

## 2022-11-21 DIAGNOSIS — J301 Allergic rhinitis due to pollen: Secondary | ICD-10-CM | POA: Diagnosis not present

## 2022-12-01 ENCOUNTER — Telehealth: Payer: Self-pay | Admitting: Physician Assistant

## 2022-12-01 NOTE — Telephone Encounter (Signed)
Sleep study orders, clinical notes, demographics, and copies of insurance cards have been faxed to [facility] at [phone]. Office will contact patient to complete online registration in order to ship home sleep study equipment.

## 2022-12-05 ENCOUNTER — Encounter: Payer: Self-pay | Admitting: Physician Assistant

## 2022-12-05 ENCOUNTER — Ambulatory Visit (INDEPENDENT_AMBULATORY_CARE_PROVIDER_SITE_OTHER): Payer: BC Managed Care – PPO | Admitting: Physician Assistant

## 2022-12-05 VITALS — BP 130/88 | HR 99 | Ht 64.0 in | Wt 224.0 lb

## 2022-12-05 DIAGNOSIS — Z0000 Encounter for general adult medical examination without abnormal findings: Secondary | ICD-10-CM

## 2022-12-05 DIAGNOSIS — G478 Other sleep disorders: Secondary | ICD-10-CM

## 2022-12-05 DIAGNOSIS — E66812 Obesity, class 2: Secondary | ICD-10-CM

## 2022-12-05 DIAGNOSIS — R0683 Snoring: Secondary | ICD-10-CM | POA: Diagnosis not present

## 2022-12-05 DIAGNOSIS — Z23 Encounter for immunization: Secondary | ICD-10-CM | POA: Diagnosis not present

## 2022-12-05 DIAGNOSIS — Z131 Encounter for screening for diabetes mellitus: Secondary | ICD-10-CM | POA: Diagnosis not present

## 2022-12-05 DIAGNOSIS — R030 Elevated blood-pressure reading, without diagnosis of hypertension: Secondary | ICD-10-CM | POA: Insufficient documentation

## 2022-12-05 DIAGNOSIS — E6609 Other obesity due to excess calories: Secondary | ICD-10-CM

## 2022-12-05 DIAGNOSIS — Z13220 Encounter for screening for lipoid disorders: Secondary | ICD-10-CM | POA: Diagnosis not present

## 2022-12-05 DIAGNOSIS — J301 Allergic rhinitis due to pollen: Secondary | ICD-10-CM | POA: Diagnosis not present

## 2022-12-05 DIAGNOSIS — Z6838 Body mass index (BMI) 38.0-38.9, adult: Secondary | ICD-10-CM

## 2022-12-05 NOTE — Patient Instructions (Addendum)
Amlodipine Tablets What is this medication? AMLODIPINE (am LOE di peen) treats high blood pressure and prevents chest pain (angina). It works by relaxing the blood vessels, which helps decrease the amount of work your heart has to do. It belongs to a group of medications called calcium channel blockers. This medicine may be used for other purposes; ask your health care provider or pharmacist if you have questions. COMMON BRAND NAME(S): Norvasc What should I tell my care team before I take this medication? They need to know if you have any of these conditions: Heart disease Liver disease An unusual or allergic reaction to amlodipine, other medications, foods, dyes, or preservatives Pregnant or trying to get pregnant Breastfeeding How should I use this medication? Take this medication by mouth. Take it as directed on the prescription label at the same time every day. You can take it with or without food. If it upsets your stomach, take it with food. Keep taking it unless your care team tells you to stop. Talk to your care team about the use of this medication in children. While it may be prescribed for children as young as 6 for selected conditions, precautions do apply. Overdosage: If you think you have taken too much of this medicine contact a poison control center or emergency room at once. NOTE: This medicine is only for you. Do not share this medicine with others. What if I miss a dose? If you miss a dose, take it as soon as you can. If it is almost time for your next dose, take only that dose. Do not take double or extra doses. What may interact with this medication? Clarithromycin Cyclosporine Diltiazem Itraconazole Simvastatin Tacrolimus This list may not describe all possible interactions. Give your health care provider a list of all the medicines, herbs, non-prescription drugs, or dietary supplements you use. Also tell them if you smoke, drink alcohol, or use illegal drugs. Some  items may interact with your medicine. What should I watch for while using this medication? Visit your care team for regular checks on your progress. Check your blood pressure as directed. Know what your blood pressure should be and when to contact your care team. Do not treat yourself for coughs, colds, or pain while you are using this medication without asking your care team for advice. Some medications may increase your blood pressure. This medication may affect your coordination, reaction time, or judgment. Do not drive or operate machinery until you know how this medication affects you. Sit up or stand slowly to reduce the risk of dizzy or fainting spells. Drinking alcohol with this medication can increase the risk of these side effects. What side effects may I notice from receiving this medication? Side effects that you should report to your care team as soon as possible: Allergic reactions--skin rash, itching, hives, swelling of the face, lips, tongue, or throat Heart attack--pain or tightness in the chest, shoulders, arms, or jaw, nausea, shortness of breath, cold or clammy skin, feeling faint or lightheaded Low blood pressure--dizziness, feeling faint or lightheaded, blurry vision Worsening chest pain (angina)--pain, pressure, or tightness in the chest, neck, back, or arms Side effects that usually do not require medical attention (report these to your care team if they continue or are bothersome): Facial flushing, redness Heart palpitations--rapid, pounding, or irregular heartbeat Nausea Stomach pain Swelling of the ankles, hands, or feet This list may not describe all possible side effects. Call your doctor for medical advice about side effects. You may report side  effects to FDA at 1-800-FDA-1088. Where should I keep my medication? Keep out of the reach of children and pets. Store at room temperature between 20 and 25 degrees C (68 and 77 degrees F). Protect from light and moisture.  Keep the container tightly closed. Get rid of any unused medication after the expiration date. To get rid of medications that are no longer needed or have expired: Take the medication to a medication take-back program. Check with your pharmacy or law enforcement to find a location. If you cannot return the medication, check the label or package insert to see if the medication should be thrown out in the garbage or flushed down the toilet. If you are not sure, ask your care team. If it is safe to put in the trash, empty the medication out of the container. Mix the medication with cat litter, dirt, coffee grounds, or other unwanted substance. Seal the mixture in a bag or container. Put it in the trash. NOTE: This sheet is a summary. It may not cover all possible information. If you have questions about this medicine, talk to your doctor, pharmacist, or health care provider.  2024 Elsevier/Gold Standard (2021-09-12 00:00:00)   Preventing Hypertension Hypertension, also called high blood pressure, is when the force of blood pumping through the arteries is too strong. Arteries are blood vessels that carry blood from the heart throughout the body. Often, hypertension does not cause symptoms until blood pressure is very high. It is important to have your blood pressure checked regularly. Diet and lifestyle changes can help you prevent hypertension, and they may make you feel better overall and improve your quality of life. If you already have hypertension, you may control it with diet and lifestyle changes, as well as with medicine. How can this condition affect me? Over time, hypertension can damage the arteries and decrease blood flow to important parts of the body, including the brain, heart, and kidneys. By keeping your blood pressure in a healthy range, you can help prevent complications like heart attack, heart failure, stroke, kidney failure, and vascular dementia. What can increase my risk? An  unhealthy diet and a lack of physical activity can make you more likely to develop high blood pressure. Some other risk factors include: Age. The risk increases with age. Having family members who have had high blood pressure. Having certain health conditions, such as thyroid problems. Being overweight or obese. Drinking too much alcohol or caffeine. Having too much fat, sugar, calories, or salt (sodium) in your diet. Smoking or using illegal drugs. Taking certain medicines, such as antidepressants, decongestants, birth control pills, and NSAIDs, such as ibuprofen. What actions can I take to prevent or manage this condition? Work with your health care provider to make a hypertension prevention plan that works for you. You may be referred for counseling on a healthy diet and physical activity. Follow your plan and keep all follow-up visits. Diet changes Maintain a healthy diet. This includes: Eating less salt (sodium). Ask your health care provider how much sodium is safe for you to have. The general recommendation is to have less than 1 tsp (2,300 mg) of sodium a day. Do not add salt to your food. Choose low-sodium options when grocery shopping and eating out. Limiting fats in your diet. You can do this by eating low-fat or fat-free dairy products and by eating less red meat. Eating more fruits, vegetables, and whole grains. Make a goal to eat: 1-2 cups of fresh fruits and vegetables each  day. 3-4 servings of whole grains each day. Avoiding foods and beverages that have added sugars. Eating fish that contain healthy fats (omega-3 fatty acids), such as mackerel or salmon. If you need help putting together a healthy eating plan, try the DASH diet. This diet is high in fruits, vegetables, and whole grains. It is low in sodium, red meat, and added sugars. DASH stands for Dietary Approaches to Stop Hypertension. Lifestyle changes  Lose weight if you are overweight. Losing just 3-5% of your body  weight can help prevent or control hypertension. For example, if your present weight is 200 lb (91 kg), a loss of 3-5% of your weight means losing 6-10 lb (2.7-4.5 kg). Ask your health care provider to help you with a diet and exercise plan to safely lose weight. Get enough exercise. Do at least 150 minutes of moderate-intensity exercise each week. You could do this in short exercise sessions several times a day, or you could do longer exercise sessions a few times a week. For example, you could take a brisk 10-minute walk or bike ride, 3 times a day, for 5 days a week. Find ways to reduce stress, such as exercising, meditating, listening to music, or taking a yoga class. If you need help reducing stress, ask your health care provider. Do not use any products that contain nicotine or tobacco. These products include cigarettes, chewing tobacco, and vaping devices, such as e-cigarettes. Chemicals in tobacco and nicotine products raise your blood pressure each time you use them. If you need help quitting, ask your health care provider. Learn how to check your blood pressure at home. Make sure that you know your personal target blood pressure, as told by your health care provider. Try to sleep 7-9 hours per night. Alcohol use Do not drink alcohol if: Your health care provider tells you not to drink. You are pregnant, may be pregnant, or are planning to become pregnant. If you drink alcohol: Limit how much you have to: 0-1 drink a day for women. 0-2 drinks a day for men. Know how much alcohol is in your drink. In the [place] one drink equals one 12 oz bottle of beer (355 mL), one 5 oz glass of wine (148 mL), or one 1 oz glass of hard liquor (44 mL). Medicines In addition to diet and lifestyle changes, your health care provider may recommend medicines to help lower your blood pressure. In general: You may need to try a few different medicines to find what works best for you. You may need to take more than  one medicine. Take over-the-counter and prescription medicines only as told by your health care provider. Questions to ask your health care provider What is my blood pressure goal? How can I lower my risk for high blood pressure? How should I monitor my blood pressure at home? Where to find support Your health care provider can help you prevent hypertension and help you keep your blood pressure at a healthy level. Your local hospital or your community may also provide support services and prevention programs. The American Heart Association offers an online support network at supportnetwork.heart.org Where to find more information Learn more about hypertension from: National Heart, Lung, and Blood Institute: PopSteam.is Centers for Disease Control and Prevention: FootballExhibition.com.br American Academy of Family Physicians: familydoctor.org Learn more about the DASH diet from: National Heart, Lung, and Blood Institute: PopSteam.is Contact a health care provider if: You think you are having a reaction to medicines you have taken. You have  recurrent headaches or feel dizzy. You have swelling in your ankles. You have trouble with your vision. Get help right away if: You have sudden, severe chest, back, or abdominal pain or discomfort. You have shortness of breath. You have a sudden, severe headache. These symptoms may be an emergency. Get help right away. Call 911. Do not wait to see if the symptoms will go away. Do not drive yourself to the hospital. Summary Hypertension often does not cause any symptoms until blood pressure is very high. It is important to get your blood pressure checked regularly. Diet and lifestyle changes are important steps in preventing hypertension. By keeping your blood pressure in a healthy range, you may prevent complications like heart attack, heart failure, stroke, and kidney failure. Work with your health care provider to make a hypertension prevention plan  that works for you. This information is not intended to replace advice given to you by your health care provider. Make sure you discuss any questions you have with your health care provider. Document Revised: 12/09/2020 Document Reviewed: 12/09/2020 Elsevier Patient Education  2024 [employer].

## 2022-12-05 NOTE — Progress Notes (Signed)
Complete physical exam  Patient: Elaine Murillo   DOB: 10-20-1970   52 y.o. Female  MRN: [id number]  Subjective:    Chief Complaint  Patient presents with   Annual Exam    Fast labs want tdap and flu vaccine    Elaine Murillo is a 52 y.o. female who presents today for a complete physical exam. She reports consuming a general diet. Gym/ health club routine includes cardio, stationary bike, and swimming. She generally feels well. She reports sleeping fairly well but does report snoring. She states that her home sleep study will be completed soon. She does not have additional problems to discuss today.    Most recent fall risk assessment:    12/05/2022    3:57 PM  Fall Risk   Falls in the past year? 0  Number falls in past yr: 0  Injury with Fall? 0  Risk for fall due to : No Fall Risks  Follow up Falls evaluation completed     Most recent depression screenings:    12/05/2022    3:57 PM 12/05/2021    8:31 AM  PHQ 2/9 Scores  PHQ - 2 Score 0 0  PHQ- 9 Score  0    Vision:Within last year and Dental: No current dental problems  Patient Active Problem List   Diagnosis Date Noted   Pre-hypertension 12/05/2022   Non-restorative sleep 11/21/2022   Snoring 11/21/2022   White coat syndrome without hypertension 04/18/2021   Primary hypertension 12/02/2020   Obesity (BMI 30-39.9) 06/01/2020   Pain in left toe(s) 03/17/2020   Women's annual routine gynecological examination 12/08/2019   Primary osteoarthritis of right hip 09/04/2019   Hand pain 09/04/2019   IUD (intrauterine device) in place 05/27/2019   Facial flushing 12/08/2018   Abnormal RBC 12/08/2018   ETD (Eustachian tube dysfunction), right 12/03/2018   Macromastia 01/18/2018   Dry skin 12/29/2016   Itching 12/29/2016   Elevated blood pressure reading 06/06/2016   Allergic rhinitis with postnasal drip 06/01/2016   Migraine variant 11/16/2015   Hallux valgus of left foot 02/08/2015   Metatarsalgia of left foot  02/08/2015   Class 2 obesity due to excess calories without serious comorbidity with body mass index (BMI) of 38.0 to 38.9 in adult 02/08/2015   Right-sided low back pain with right-sided sciatica 02/16/2014   Vitamin D insufficiency 02/03/2014   Need for prophylactic vaccination and inoculation against influenza 11/21/2012   WEIGHT GAIN 08/20/2008   Past Medical History:  Diagnosis Date   Allergy    seasonal   Anxiety    Arthritis    Hypertension    Uterine fibroids affecting pregnancy    Past Surgical History:  Procedure Laterality Date   TONSILLECTOMY     Family History  Problem Relation Age of Onset   Stroke Paternal Grandmother    Colon cancer Neg Hx    Colon polyps Neg Hx    Esophageal cancer Neg Hx    Rectal cancer Neg Hx    Stomach cancer Neg Hx    No Known Allergies    Patient Care Team: Nolene Ebbs as PCP - General (Family Medicine)   Outpatient Medications Prior to Visit  Medication Sig   cetirizine (ZYRTEC) 10 MG tablet Take 10 mg by mouth daily.   levonorgestrel (LILETTA, 52 MG,) 19.5 MCG/DAY IUD IUD 1 each by Intrauterine route once.   Omega-3 Fatty Acids (FISH OIL) 1000 MG CAPS Take 4,000 mg by mouth daily.   SOOLANTRA 1 %  CREA Apply topically.   No facility-administered medications prior to visit.    Review of Systems  All other systems reviewed and are negative.         Objective:     BP 130/88   Pulse 99   Ht 5\' 4"  (1.626 m)   Wt 224 lb (101.6 kg)   SpO2 99%   BMI 38.45 kg/m  BP Readings from Last 3 Encounters:  12/05/22 130/88  11/14/22 (!) 147/92  02/09/22 (!) 149/86   Wt Readings from Last 3 Encounters:  12/05/22 224 lb (101.6 kg)  11/14/22 220 lb (99.8 kg)  02/09/22 226 lb (102.5 kg)      Physical Exam  BP 130/88   Pulse 99   Ht 5\' 4"  (1.626 m)   Wt 224 lb (101.6 kg)   SpO2 99%   BMI 38.45 kg/m   General Appearance:    Alert, cooperative, obese no distress, appears stated age  Head:    Normocephalic,  without obvious abnormality, atraumatic  Eyes:    PERRL, conjunctiva/corneas clear, EOM's intact, fundi    benign, both eyes  Ears:    Normal TM's and external ear canals, both ears  Nose:   Nares normal, septum midline, mucosa normal, no drainage    or sinus tenderness  Throat:   Lips, mucosa, and tongue normal; teeth and gums normal  Neck:   Supple, symmetrical, trachea midline, no adenopathy;    thyroid:  no enlargement/tenderness/nodules; no carotid   bruit or JVD  Back:     Symmetric, no curvature, ROM normal, no CVA tenderness  Lungs:     Clear to auscultation bilaterally, respirations unlabored  Chest Wall:    No tenderness or deformity   Heart:    Regular rate and rhythm, S1 and S2 normal, no murmur, rub   or gallop  Breast Exam:    Large breast  Abdomen:     Soft, non-tender, bowel sounds active all four quadrants,    no masses, no organomegaly        Extremities:   Extremities normal, atraumatic, no cyanosis or edema  Pulses:   2+ and symmetric all extremities  Skin:   Skin color, texture, turgor normal, no rashes or lesions  Lymph nodes:   Cervical, supraclavicular, and axillary nodes normal  Neurologic:   CNII-XII intact, normal strength, sensation and reflexes    throughout      Assessment & Plan:    Routine Health Maintenance and Physical Exam  Immunization History  Administered Date(s) Administered   Influenza Split 12/18/2011   Influenza, Seasonal, Injecte, Preservative Fre 12/05/2022   Influenza,inj,Quad PF,6+ Mos 11/21/2012, 12/10/2013, 11/25/2014, 11/16/2015, 12/27/2016, 12/17/2017, 12/03/2018, 12/03/2019, 12/02/2020, 12/05/2021   Moderna Sars-Covid-2 Vaccination 06/05/2019, 07/03/2019   Tdap 04/10/2012, 12/05/2022   Zoster Recombinant(Shingrix) 12/02/2020, 03/18/2021    Health Maintenance  Topic Date Due   COVID-19 Vaccine (3 - 2023-24 season) 12/21/2022 (Originally 11/05/2022)   MAMMOGRAM  05/04/2023   Cervical Cancer Screening (HPV/Pap Cotest)   11/14/2027   Colonoscopy  02/09/2030   DTaP/Tdap/Td (3 - Td or Tdap) 12/04/2032   INFLUENZA VACCINE  Completed   Hepatitis C Screening  Completed   HIV Screening  Completed   Zoster Vaccines- Shingrix  Completed   HPV VACCINES  Aged Out    Discussed health benefits of physical activity, and encouraged her to engage in regular exercise appropriate for her age and condition. Marland KitchenThurston Hole was seen today for annual exam.  Diagnoses and all orders for this visit:  Routine physical examination -     CBC w/Diff/Platelet -     CMP14+EGFR -     Lipid panel -     TSH -     VITAMIN D 25 Hydroxy (Vit-D Deficiency, Fractures)  Immunization due -     Flu vaccine trivalent PF, 6mos and older(Flulaval,Afluria,Fluarix,Fluzone)  Snoring  Non-restorative sleep  Screening for diabetes mellitus -     CMP14+EGFR  Screening for lipid disorders -     Lipid panel  Pre-hypertension  Need for Tdap vaccination -     Tdap vaccine greater than or equal to 7yo IM  Class 2 obesity due to excess calories without serious comorbidity with body mass index (BMI) of 38.0 to 38.9 in adult  .Marland Kitchen Discussed 150 minutes of exercise a week.  Encouraged vitamin D 1000 units and Calcium 1300mg  or 4 servings of dairy a day.   Fasting labs ordered today PHQ no concerns Discussed health benefits of continued physical activity Encouraged vitamin D 1000 units and calcium 1300 mg or 4 servings of dairy a day Flu and tetanus given today Pap smear and mammogram UTD Colonoscopy UTD Home sleep study ordered and to be completed soon for snoring and non restorative sleep Will recheck BP in 3 months and encouraged home monitoring of BP Discussed adding on low dose amlodipine if BP remains elevated at follow-up Follow-up in 3 months     Tandy Gaw, PA-C

## 2022-12-06 LAB — CMP14+EGFR
ALT: 13 [IU]/L (ref 0–32)
AST: 16 [IU]/L (ref 0–40)
Albumin: 4.2 g/dL (ref 3.8–4.9)
Alkaline Phosphatase: 79 [IU]/L (ref 44–121)
BUN/Creatinine Ratio: 10 (ref 9–23)
BUN: 9 mg/dL (ref 6–24)
Bilirubin Total: 0.6 mg/dL (ref 0.0–1.2)
CO2: 22 mmol/L (ref 20–29)
Calcium: 9.1 mg/dL (ref 8.7–10.2)
Chloride: 101 mmol/L (ref 96–106)
Creatinine, Ser: 0.89 mg/dL (ref 0.57–1.00)
Globulin, Total: 2.7 g/dL (ref 1.5–4.5)
Glucose: 87 mg/dL (ref 70–99)
Potassium: 4.4 mmol/L (ref 3.5–5.2)
Sodium: 138 mmol/L (ref 134–144)
Total Protein: 6.9 g/dL (ref 6.0–8.5)
eGFR: 78 mL/min/{1.73_m2} (ref >59)

## 2022-12-06 LAB — CBC WITH DIFFERENTIAL/PLATELET
Basophils Absolute: 0.1 10*3/uL (ref 0.0–0.2)
Basos: 1 %
EOS (ABSOLUTE): 0.4 10*3/uL (ref 0.0–0.4)
Eos: 4 %
Hematocrit: 46.8 % — ABNORMAL HIGH (ref 34.0–46.6)
Hemoglobin: 15.1 g/dL (ref 11.1–15.9)
Immature Grans (Abs): 0.1 10*3/uL (ref 0.0–0.1)
Immature Granulocytes: 1 %
Lymphocytes Absolute: 2.9 10*3/uL (ref 0.7–3.1)
Lymphs: 31 %
MCH: 29.5 pg (ref 26.6–33.0)
MCHC: 32.3 g/dL (ref 31.5–35.7)
MCV: 92 fL (ref 79–97)
Monocytes Absolute: 0.5 10*3/uL (ref 0.1–0.9)
Monocytes: 5 %
Neutrophils Absolute: 5.3 10*3/uL (ref 1.4–7.0)
Neutrophils: 58 %
Platelets: 261 10*3/uL (ref 150–450)
RBC: 5.11 x10E6/uL (ref 3.77–5.28)
RDW: 12.5 % (ref 11.7–15.4)
WBC: 9.1 10*3/uL (ref 3.4–10.8)

## 2022-12-06 LAB — TSH: TSH: 1.35 u[IU]/mL (ref 0.450–4.500)

## 2022-12-06 LAB — LIPID PANEL
Chol/HDL Ratio: 4.1 {ratio} (ref 0.0–4.4)
Cholesterol, Total: 184 mg/dL (ref 100–199)
HDL: 45 mg/dL (ref >39)
LDL Chol Calc (NIH): 119 mg/dL — ABNORMAL HIGH (ref 0–99)
Triglycerides: 109 mg/dL (ref 0–149)
VLDL Cholesterol Cal: 20 mg/dL (ref 5–40)

## 2022-12-06 LAB — VITAMIN D 25 HYDROXY (VIT D DEFICIENCY, FRACTURES): Vit D, 25-Hydroxy: 35.2 ng/mL (ref 30.0–100.0)

## 2022-12-06 NOTE — Progress Notes (Signed)
Brittanyann,   Kidney, liver, glucose look good.  Thyroid looks great.  Vitamin D normal range but would advise you to take at least 1000 units a day.  HDL looks good.  LDL not quite to optimal but looks good.   10 year cardiovascular risk is 1.7 and really low. I would suggest continuing low fat/processed food diet and regular exercise as well as rechecking in 1 year.   Marland Kitchen.The 10-year ASCVD risk score (Arnett DK, et al., 2019) is: 1.7%   Values used to calculate the score:     Age: 52 years     Sex: Female     Is Non-Hispanic African American: No     Diabetic: No     Tobacco smoker: No     Systolic Blood Pressure: 130 mmHg     Is BP treated: No     HDL Cholesterol: 45 mg/dL     Total Cholesterol: 184 mg/dL

## 2022-12-08 ENCOUNTER — Encounter: Payer: BC Managed Care – PPO | Admitting: Physician Assistant

## 2022-12-13 DIAGNOSIS — L981 Factitial dermatitis: Secondary | ICD-10-CM | POA: Diagnosis not present

## 2022-12-13 DIAGNOSIS — L230 Allergic contact dermatitis due to metals: Secondary | ICD-10-CM | POA: Diagnosis not present

## 2022-12-13 DIAGNOSIS — L719 Rosacea, unspecified: Secondary | ICD-10-CM | POA: Diagnosis not present

## 2022-12-15 DIAGNOSIS — J301 Allergic rhinitis due to pollen: Secondary | ICD-10-CM | POA: Diagnosis not present

## 2022-12-19 DIAGNOSIS — G4733 Obstructive sleep apnea (adult) (pediatric): Secondary | ICD-10-CM

## 2022-12-19 DIAGNOSIS — J301 Allergic rhinitis due to pollen: Secondary | ICD-10-CM | POA: Diagnosis not present

## 2023-01-02 DIAGNOSIS — J301 Allergic rhinitis due to pollen: Secondary | ICD-10-CM | POA: Diagnosis not present

## 2023-01-05 DIAGNOSIS — G4733 Obstructive sleep apnea (adult) (pediatric): Secondary | ICD-10-CM | POA: Insufficient documentation

## 2023-01-16 DIAGNOSIS — J301 Allergic rhinitis due to pollen: Secondary | ICD-10-CM | POA: Diagnosis not present

## 2023-01-30 DIAGNOSIS — J301 Allergic rhinitis due to pollen: Secondary | ICD-10-CM | POA: Diagnosis not present

## 2023-01-31 NOTE — Addendum Note (Signed)
Addended by: Jomarie Longs on: 01/31/2023 07:27 AM   Modules accepted: Orders

## 2023-02-13 DIAGNOSIS — J301 Allergic rhinitis due to pollen: Secondary | ICD-10-CM | POA: Diagnosis not present

## 2023-03-14 DIAGNOSIS — L209 Atopic dermatitis, unspecified: Secondary | ICD-10-CM | POA: Diagnosis not present

## 2023-03-14 DIAGNOSIS — J301 Allergic rhinitis due to pollen: Secondary | ICD-10-CM | POA: Diagnosis not present

## 2023-03-14 DIAGNOSIS — R232 Flushing: Secondary | ICD-10-CM | POA: Diagnosis not present

## 2023-03-14 DIAGNOSIS — L299 Pruritus, unspecified: Secondary | ICD-10-CM | POA: Diagnosis not present

## 2023-03-19 ENCOUNTER — Ambulatory Visit: Payer: BC Managed Care – PPO | Admitting: Physician Assistant

## 2023-03-21 ENCOUNTER — Encounter: Payer: Self-pay | Admitting: Physician Assistant

## 2023-03-21 ENCOUNTER — Ambulatory Visit (INDEPENDENT_AMBULATORY_CARE_PROVIDER_SITE_OTHER): Payer: BC Managed Care – PPO | Admitting: Physician Assistant

## 2023-03-21 ENCOUNTER — Telehealth (INDEPENDENT_AMBULATORY_CARE_PROVIDER_SITE_OTHER): Payer: Self-pay | Admitting: Physician Assistant

## 2023-03-21 VITALS — BP 142/78 | HR 86 | Ht 64.0 in | Wt 225.0 lb

## 2023-03-21 DIAGNOSIS — G4733 Obstructive sleep apnea (adult) (pediatric): Secondary | ICD-10-CM | POA: Diagnosis not present

## 2023-03-21 DIAGNOSIS — E66812 Obesity, class 2: Secondary | ICD-10-CM

## 2023-03-21 DIAGNOSIS — Z6838 Body mass index (BMI) 38.0-38.9, adult: Secondary | ICD-10-CM

## 2023-03-21 DIAGNOSIS — I10 Essential (primary) hypertension: Secondary | ICD-10-CM | POA: Diagnosis not present

## 2023-03-21 NOTE — Telephone Encounter (Signed)
 CPAP machine orders,Sleep study results, clinical notes, demographics, and copies of insurance cards have been faxed to [facility] at [phone]. Office will contact patient to complete  registration in order to ship CPAP equipment.

## 2023-03-21 NOTE — Patient Instructions (Signed)
 Hypertension, Adult High blood pressure (hypertension) is when the force of blood pumping through the arteries is too strong. The arteries are the blood vessels that carry blood from the heart throughout the body. Hypertension forces the heart to work harder to pump blood and may cause arteries to become narrow or stiff. Untreated or uncontrolled hypertension can lead to a heart attack, heart failure, a stroke, kidney disease, and other problems. A blood pressure reading consists of a higher number over a lower number. Ideally, your blood pressure should be below 120/80. The first ("top") number is called the systolic pressure. It is a measure of the pressure in your arteries as your heart beats. The second ("bottom") number is called the diastolic pressure. It is a measure of the pressure in your arteries as the heart relaxes. What are the causes? The exact cause of this condition is not known. There are some conditions that result in high blood pressure. What increases the risk? Certain factors may make you more likely to develop high blood pressure. Some of these risk factors are under your control, including: Smoking. Not getting enough exercise or physical activity. Being overweight. Having too much fat, sugar, calories, or salt (sodium) in your diet. Drinking too much alcohol. Other risk factors include: Having a personal history of heart disease, diabetes, high cholesterol, or kidney disease. Stress. Having a family history of high blood pressure and high cholesterol. Having obstructive sleep apnea. Age. The risk increases with age. What are the signs or symptoms? High blood pressure may not cause symptoms. Very high blood pressure (hypertensive crisis) may cause: Headache. Fast or irregular heartbeats (palpitations). Shortness of breath. Nosebleed. Nausea and vomiting. Vision changes. Severe chest pain, dizziness, and seizures. How is this diagnosed? This condition is diagnosed by  measuring your blood pressure while you are seated, with your arm resting on a flat surface, your legs uncrossed, and your feet flat on the floor. The cuff of the blood pressure monitor will be placed directly against the skin of your upper arm at the level of your heart. Blood pressure should be measured at least twice using the same arm. Certain conditions can cause a difference in blood pressure between your right and left arms. If you have a high blood pressure reading during one visit or you have normal blood pressure with other risk factors, you may be asked to: Return on a different day to have your blood pressure checked again. Monitor your blood pressure at home for 1 week or longer. If you are diagnosed with hypertension, you may have other blood or imaging tests to help your health care provider understand your overall risk for other conditions. How is this treated? This condition is treated by making healthy lifestyle changes, such as eating healthy foods, exercising more, and reducing your alcohol intake. You may be referred for counseling on a healthy diet and physical activity. Your health care provider may prescribe medicine if lifestyle changes are not enough to get your blood pressure under control and if: Your systolic blood pressure is above 130. Your diastolic blood pressure is above 80. Your personal target blood pressure may vary depending on your medical conditions, your age, and other factors. Follow these instructions at home: Eating and drinking  Eat a diet that is high in fiber and potassium, and low in sodium, added sugar, and fat. An example of this eating plan is called the DASH diet. DASH stands for Dietary Approaches to Stop Hypertension. To eat this way: Eat  plenty of fresh fruits and vegetables. Try to fill one half of your plate at each meal with fruits and vegetables. Eat whole grains, such as whole-wheat pasta, brown rice, or whole-grain bread. Fill about one  fourth of your plate with whole grains. Eat or drink low-fat dairy products, such as skim milk or low-fat yogurt. Avoid fatty cuts of meat, processed or cured meats, and poultry with skin. Fill about one fourth of your plate with lean proteins, such as fish, chicken without skin, beans, eggs, or tofu. Avoid pre-made and processed foods. These tend to be higher in sodium, added sugar, and fat. Reduce your daily sodium intake. Many people with hypertension should eat less than 1,500 mg of sodium a day. Do not drink alcohol if: Your health care provider tells you not to drink. You are pregnant, may be pregnant, or are planning to become pregnant. If you drink alcohol: Limit how much you have to: 0-1 drink a day for women. 0-2 drinks a day for men. Know how much alcohol is in your drink. In the [place] one drink equals one 12 oz bottle of beer (355 mL), one 5 oz glass of wine (148 mL), or one 1 oz glass of hard liquor (44 mL). Lifestyle  Work with your health care provider to maintain a healthy body weight or to lose weight. Ask what an ideal weight is for you. Get at least 30 minutes of exercise that causes your heart to beat faster (aerobic exercise) most days of the week. Activities may include walking, swimming, or biking. Include exercise to strengthen your muscles (resistance exercise), such as Pilates or lifting weights, as part of your weekly exercise routine. Try to do these types of exercises for 30 minutes at least 3 days a week. Do not use any products that contain nicotine or tobacco. These products include cigarettes, chewing tobacco, and vaping devices, such as e-cigarettes. If you need help quitting, ask your health care provider. Monitor your blood pressure at home as told by your health care provider. Keep all follow-up visits. This is important. Medicines Take over-the-counter and prescription medicines only as told by your health care provider. Follow directions carefully. Blood  pressure medicines must be taken as prescribed. Do not skip doses of blood pressure medicine. Doing this puts you at risk for problems and can make the medicine less effective. Ask your health care provider about side effects or reactions to medicines that you should watch for. Contact a health care provider if you: Think you are having a reaction to a medicine you are taking. Have headaches that keep coming back (recurring). Feel dizzy. Have swelling in your ankles. Have trouble with your vision. Get help right away if you: Develop a severe headache or confusion. Have unusual weakness or numbness. Feel faint. Have severe pain in your chest or abdomen. Vomit repeatedly. Have trouble breathing. These symptoms may be an emergency. Get help right away. Call 911. Do not wait to see if the symptoms will go away. Do not drive yourself to the hospital. Summary Hypertension is when the force of blood pumping through your arteries is too strong. If this condition is not controlled, it may put you at risk for serious complications. Your personal target blood pressure may vary depending on your medical conditions, your age, and other factors. For most people, a normal blood pressure is less than 120/80. Hypertension is treated with lifestyle changes, medicines, or a combination of both. Lifestyle changes include losing weight, eating a healthy,  low-sodium diet, exercising more, and limiting alcohol. This information is not intended to replace advice given to you by your health care provider. Make sure you discuss any questions you have with your health care provider. Document Revised: 12/28/2020 Document Reviewed: 12/28/2020 Elsevier Patient Education  2024 [employer].

## 2023-03-21 NOTE — Progress Notes (Signed)
 Established Patient Office Visit  Subjective   Patient ID: Elaine Murillo, female    DOB: 10-30-1970  Age: 53 y.o. MRN: [id number]  Chief Complaint  Patient presents with   Medical Management of Chronic Issues    3 mo fup on htn    HPI  Pt is a 53 yo female coming in for a follow up for HTN. She states that since her last appointment she has been doing well. She occassionally checks her BP at home, with her readings being in the 130s-140s/ 80s. She denies any associated chest pain, SOB, palpitations, headaches or vision changes. She says that she is exercising 2x a week at a spin class. Pt is still hesitant on starting medication for HTN.  A sleep study was done back in November showing OSA but the pt is not currently using a CPAP machine. She says that she is sleeping better than before because she switched the side of the bed she was sleeping on. Denies having daytime somnolence or extreme fatigue upon waking in the morning.  Pt is also planning on scheduling surgery for her bunion  Review of Systems  All other systems reviewed and are negative.  Active Ambulatory Problems    Diagnosis Date Noted   WEIGHT GAIN 08/20/2008   Need for prophylactic vaccination and inoculation against influenza 11/21/2012   Vitamin D  insufficiency 02/03/2014   Right-sided low back pain with right-sided sciatica 02/16/2014   Hallux valgus of left foot 02/08/2015   Metatarsalgia of left foot 02/08/2015   Class 2 obesity due to excess calories without serious comorbidity with body mass index (BMI) of 38.0 to 38.9 in adult 02/08/2015   Migraine variant 11/16/2015   Allergic rhinitis with postnasal drip 06/01/2016   Elevated blood pressure reading 06/06/2016   Dry skin 12/29/2016   Itching 12/29/2016   Macromastia 01/18/2018   ETD (Eustachian tube dysfunction), right 12/03/2018   Facial flushing 12/08/2018   Abnormal RBC 12/08/2018   IUD (intrauterine device) in place 05/27/2019   Primary  osteoarthritis of right hip 09/04/2019   Hand pain 09/04/2019   Women's annual routine gynecological examination 12/08/2019   Pain in left toe(s) 03/17/2020   Obesity (BMI 30-39.9) 06/01/2020   Primary hypertension 12/02/2020   White coat syndrome without hypertension 04/18/2021   Non-restorative sleep 11/21/2022   Snoring 11/21/2022   Pre-hypertension 12/05/2022   Severe obstructive sleep apnea 01/05/2023   Resolved Ambulatory Problems    Diagnosis Date Noted   PLANTAR FASCIITIS, BILATERAL 08/20/2008   Encounter for supervision of normal pregnancy 12/18/2011   Labor, precipitous, delivered 08/05/2012   High triglycerides 02/03/2014   Rash and nonspecific skin eruption 02/08/2015   Hip impingement syndrome, right 11/25/2019   Past Medical History:  Diagnosis Date   Allergy    Anxiety    Arthritis    Hypertension    Uterine fibroids affecting pregnancy         Objective:     BP (!) 142/78   Pulse 86   Ht 5\' 4"  (1.626 m)   Wt 225 lb (102.1 kg)   SpO2 100%   BMI 38.62 kg/m  BP Readings from Last 3 Encounters:  03/21/23 (!) 142/78  12/05/22 130/88  11/14/22 (!) 147/92      Physical Exam Constitutional:      Appearance: Normal appearance. She is obese.  HENT:     Head: Normocephalic.  Cardiovascular:     Rate and Rhythm: Normal rate and regular rhythm.  Pulses: Normal pulses.     Heart sounds: Normal heart sounds.  Pulmonary:     Effort: Pulmonary effort is normal.     Breath sounds: Normal breath sounds.  Skin:    General: Skin is warm and dry.  Neurological:     General: No focal deficit present.     Mental Status: She is alert and oriented to person, place, and time.     The 10-year ASCVD risk score (Arnett DK, et al., 2019) is: 2.1%    Assessment & Plan:   Elaine Murillo was seen today for medical management of chronic issues.  Diagnoses and all orders for this visit:  Primary hypertension -     For home use only DME continuous positive airway  pressure (CPAP)  Severe obstructive sleep apnea -     For home use only DME continuous positive airway pressure (CPAP)  Class 2 severe obesity due to excess calories with serious comorbidity and body mass index (BMI) of 38.0 to 38.9 in adult [facility]) -     For home use only DME continuous positive airway pressure (CPAP)   Start using CPAP machine at night for OSA and HTN Sent order for autotitration Total obstructive events was 216 Continue to monitor BPs at home Pt declines any medications for BP at this time Continue with moderate intensity exercises for about 150 minutes per week Follow up after 4 weeks on CPAP I think CPAP could improve BP, fatigue and help with weight loss.     Tiaja Hagan, PA-C

## 2023-03-22 NOTE — Addendum Note (Signed)
Addended by: Ernest Mallick A on: 03/22/2023 08:42 AM   Modules accepted: Level of Service

## 2023-03-22 NOTE — Telephone Encounter (Signed)
Fax confirmation sent to  [facility] Phone: Toll Free at [phone] Fax:  [phone]

## 2023-03-28 DIAGNOSIS — J301 Allergic rhinitis due to pollen: Secondary | ICD-10-CM | POA: Diagnosis not present

## 2023-03-29 DIAGNOSIS — M205X1 Other deformities of toe(s) (acquired), right foot: Secondary | ICD-10-CM | POA: Diagnosis not present

## 2023-03-29 DIAGNOSIS — M24477 Recurrent dislocation, right toe(s): Secondary | ICD-10-CM | POA: Diagnosis not present

## 2023-03-29 DIAGNOSIS — M2011 Hallux valgus (acquired), right foot: Secondary | ICD-10-CM | POA: Diagnosis not present

## 2023-03-29 DIAGNOSIS — M2012 Hallux valgus (acquired), left foot: Secondary | ICD-10-CM | POA: Diagnosis not present

## 2023-04-16 DIAGNOSIS — G4733 Obstructive sleep apnea (adult) (pediatric): Secondary | ICD-10-CM | POA: Diagnosis not present

## 2023-04-18 DIAGNOSIS — J301 Allergic rhinitis due to pollen: Secondary | ICD-10-CM | POA: Diagnosis not present

## 2023-04-27 ENCOUNTER — Other Ambulatory Visit: Payer: Self-pay | Admitting: Advanced Practice Midwife

## 2023-04-27 DIAGNOSIS — Z1231 Encounter for screening mammogram for malignant neoplasm of breast: Secondary | ICD-10-CM

## 2023-05-02 DIAGNOSIS — J301 Allergic rhinitis due to pollen: Secondary | ICD-10-CM | POA: Diagnosis not present

## 2023-05-04 DIAGNOSIS — J301 Allergic rhinitis due to pollen: Secondary | ICD-10-CM | POA: Diagnosis not present

## 2023-05-14 DIAGNOSIS — G4733 Obstructive sleep apnea (adult) (pediatric): Secondary | ICD-10-CM | POA: Diagnosis not present

## 2023-05-17 DIAGNOSIS — J301 Allergic rhinitis due to pollen: Secondary | ICD-10-CM | POA: Diagnosis not present

## 2023-05-31 DIAGNOSIS — J301 Allergic rhinitis due to pollen: Secondary | ICD-10-CM | POA: Diagnosis not present

## 2023-06-12 ENCOUNTER — Ambulatory Visit (INDEPENDENT_AMBULATORY_CARE_PROVIDER_SITE_OTHER): Payer: BC Managed Care – PPO | Admitting: Physician Assistant

## 2023-06-12 ENCOUNTER — Encounter: Payer: Self-pay | Admitting: Physician Assistant

## 2023-06-12 VITALS — BP 138/76 | HR 76 | Ht 64.0 in | Wt 219.0 lb

## 2023-06-12 DIAGNOSIS — J301 Allergic rhinitis due to pollen: Secondary | ICD-10-CM | POA: Diagnosis not present

## 2023-06-12 DIAGNOSIS — G4733 Obstructive sleep apnea (adult) (pediatric): Secondary | ICD-10-CM | POA: Diagnosis not present

## 2023-06-12 NOTE — Progress Notes (Signed)
   Established Patient Office Visit  Subjective   Patient ID: Elaine Murillo, female    DOB: [birth year]  Age: 53 y.o. MRN: [id number]  Chief Complaint  Patient presents with   Medical Management of Chronic Issues    C-pap fup  pt states she feel better and is sleep    HPI Pt is a 53 yo obese female with OSA who has been on CPAP since early March. She has been using it nightly about 4-5 hours. At first she hated it but has gotten used to it now and feeling like she does have a little more energy, not as fatigued and seems to have less brain fog. She uses [facility].   Pt is checking BPs at home and ranging 110s to 130s over 70-80s. She denies any CP, palpitations, headaches or vision changes.      Objective:     BP 138/76   Pulse 76   Ht 5\' 4"  (1.626 m)   Wt 219 lb (99.3 kg)   SpO2 99%   BMI 37.59 kg/m  BP Readings from Last 3 Encounters:  06/12/23 138/76  03/21/23 (!) 142/78  12/05/22 130/88   Wt Readings from Last 3 Encounters:  06/12/23 219 lb (99.3 kg)  03/21/23 225 lb (102.1 kg)  12/05/22 224 lb (101.6 kg)      Physical Exam Constitutional:      Appearance: Normal appearance. She is obese.  Cardiovascular:     Rate and Rhythm: Normal rate and regular rhythm.     Pulses: Normal pulses.     Heart sounds: Normal heart sounds.  Musculoskeletal:     Right lower leg: No edema.     Left lower leg: No edema.  Neurological:     Mental Status: She is alert and oriented to person, place, and time.  Psychiatric:        Mood and Affect: Mood normal.        The 10-year ASCVD risk score (Arnett DK, et al., 2019) is: 2%    Assessment & Plan:  .Marland KitchenMarland KitchenCharmion was seen today for medical management of chronic issues.  Diagnoses and all orders for this visit:  Severe obstructive sleep apnea   Pt is improving on CPAP nightly.  Continue to use and try to increase the hours of night that you use.  BP improved from home readings.  Follow up in 1 year Send to [facility].    Tandy Gaw, PA-C

## 2023-06-12 NOTE — Patient Instructions (Signed)
 Slenderiiz drops for weight loss

## 2023-06-20 ENCOUNTER — Ambulatory Visit: Payer: BC Managed Care – PPO

## 2023-06-20 DIAGNOSIS — Z1231 Encounter for screening mammogram for malignant neoplasm of breast: Secondary | ICD-10-CM

## 2023-06-26 DIAGNOSIS — J301 Allergic rhinitis due to pollen: Secondary | ICD-10-CM | POA: Diagnosis not present

## 2023-07-10 DIAGNOSIS — J301 Allergic rhinitis due to pollen: Secondary | ICD-10-CM | POA: Diagnosis not present

## 2023-07-14 DIAGNOSIS — G4733 Obstructive sleep apnea (adult) (pediatric): Secondary | ICD-10-CM | POA: Diagnosis not present

## 2023-07-24 DIAGNOSIS — J301 Allergic rhinitis due to pollen: Secondary | ICD-10-CM | POA: Diagnosis not present

## 2023-08-06 ENCOUNTER — Ambulatory Visit (INDEPENDENT_AMBULATORY_CARE_PROVIDER_SITE_OTHER)

## 2023-08-06 VITALS — BP 125/80 | HR 82 | Resp 18 | Ht 64.0 in

## 2023-08-06 DIAGNOSIS — Z111 Encounter for screening for respiratory tuberculosis: Secondary | ICD-10-CM

## 2023-08-06 NOTE — Progress Notes (Signed)
 Pt here for TB skin test.                                 TB test placed on LF. Advised to RTC with in 48-72 hrs for results. Pt states she will come in 08/08/23 to have it read

## 2023-08-07 DIAGNOSIS — J301 Allergic rhinitis due to pollen: Secondary | ICD-10-CM | POA: Diagnosis not present

## 2023-08-08 ENCOUNTER — Ambulatory Visit (INDEPENDENT_AMBULATORY_CARE_PROVIDER_SITE_OTHER)

## 2023-08-08 VITALS — BP 126/81 | HR 72 | Ht 64.0 in | Wt 219.0 lb

## 2023-08-08 DIAGNOSIS — Z111 Encounter for screening for respiratory tuberculosis: Secondary | ICD-10-CM | POA: Diagnosis not present

## 2023-08-08 LAB — TB SKIN TEST
Induration: 0 mm
TB Skin Test: NEGATIVE

## 2023-08-08 NOTE — Progress Notes (Unsigned)
 Pt presented for TB skin reading.  Omm induration Negative reaction.

## 2023-08-22 DIAGNOSIS — M205X1 Other deformities of toe(s) (acquired), right foot: Secondary | ICD-10-CM | POA: Diagnosis not present

## 2023-08-22 DIAGNOSIS — Z79899 Other long term (current) drug therapy: Secondary | ICD-10-CM | POA: Diagnosis not present

## 2023-08-22 DIAGNOSIS — E669 Obesity, unspecified: Secondary | ICD-10-CM | POA: Diagnosis not present

## 2023-08-22 DIAGNOSIS — M24477 Recurrent dislocation, right toe(s): Secondary | ICD-10-CM | POA: Diagnosis not present

## 2023-08-22 DIAGNOSIS — M2011 Hallux valgus (acquired), right foot: Secondary | ICD-10-CM | POA: Diagnosis not present

## 2023-09-04 DIAGNOSIS — J301 Allergic rhinitis due to pollen: Secondary | ICD-10-CM | POA: Diagnosis not present

## 2023-09-10 DIAGNOSIS — Z9889 Other specified postprocedural states: Secondary | ICD-10-CM | POA: Diagnosis not present

## 2023-09-10 DIAGNOSIS — M2011 Hallux valgus (acquired), right foot: Secondary | ICD-10-CM | POA: Diagnosis not present

## 2023-09-25 DIAGNOSIS — J301 Allergic rhinitis due to pollen: Secondary | ICD-10-CM | POA: Diagnosis not present

## 2023-10-12 DIAGNOSIS — Z981 Arthrodesis status: Secondary | ICD-10-CM | POA: Diagnosis not present

## 2023-10-15 DIAGNOSIS — J301 Allergic rhinitis due to pollen: Secondary | ICD-10-CM | POA: Diagnosis not present

## 2023-10-23 DIAGNOSIS — J301 Allergic rhinitis due to pollen: Secondary | ICD-10-CM | POA: Diagnosis not present

## 2023-10-30 DIAGNOSIS — G4733 Obstructive sleep apnea (adult) (pediatric): Secondary | ICD-10-CM | POA: Diagnosis not present

## 2023-11-06 ENCOUNTER — Encounter: Payer: Self-pay | Admitting: Sports Medicine

## 2023-11-06 DIAGNOSIS — J301 Allergic rhinitis due to pollen: Secondary | ICD-10-CM | POA: Diagnosis not present

## 2023-11-19 DIAGNOSIS — M2012 Hallux valgus (acquired), left foot: Secondary | ICD-10-CM | POA: Diagnosis not present

## 2023-11-27 DIAGNOSIS — J301 Allergic rhinitis due to pollen: Secondary | ICD-10-CM | POA: Diagnosis not present

## 2023-12-10 DIAGNOSIS — J301 Allergic rhinitis due to pollen: Secondary | ICD-10-CM | POA: Diagnosis not present

## 2023-12-12 ENCOUNTER — Encounter: Payer: Self-pay | Admitting: Physician Assistant

## 2023-12-31 DIAGNOSIS — D235 Other benign neoplasm of skin of trunk: Secondary | ICD-10-CM | POA: Diagnosis not present

## 2023-12-31 DIAGNOSIS — L209 Atopic dermatitis, unspecified: Secondary | ICD-10-CM | POA: Diagnosis not present

## 2023-12-31 DIAGNOSIS — J301 Allergic rhinitis due to pollen: Secondary | ICD-10-CM | POA: Diagnosis not present

## 2023-12-31 DIAGNOSIS — L814 Other melanin hyperpigmentation: Secondary | ICD-10-CM | POA: Diagnosis not present

## 2023-12-31 DIAGNOSIS — D225 Melanocytic nevi of trunk: Secondary | ICD-10-CM | POA: Diagnosis not present

## 2024-01-14 DIAGNOSIS — J301 Allergic rhinitis due to pollen: Secondary | ICD-10-CM | POA: Diagnosis not present

## 2024-01-30 DIAGNOSIS — M948X7 Other specified disorders of cartilage, ankle and foot: Secondary | ICD-10-CM | POA: Diagnosis not present

## 2024-01-30 DIAGNOSIS — M2021 Hallux rigidus, right foot: Secondary | ICD-10-CM | POA: Diagnosis not present

## 2024-01-30 DIAGNOSIS — E669 Obesity, unspecified: Secondary | ICD-10-CM | POA: Diagnosis not present

## 2024-01-30 DIAGNOSIS — M2022 Hallux rigidus, left foot: Secondary | ICD-10-CM | POA: Diagnosis not present

## 2024-01-30 DIAGNOSIS — M2011 Hallux valgus (acquired), right foot: Secondary | ICD-10-CM | POA: Diagnosis not present

## 2024-01-30 DIAGNOSIS — Z6837 Body mass index (BMI) 37.0-37.9, adult: Secondary | ICD-10-CM | POA: Diagnosis not present

## 2024-01-30 DIAGNOSIS — M2012 Hallux valgus (acquired), left foot: Secondary | ICD-10-CM | POA: Diagnosis not present

## 2024-01-30 DIAGNOSIS — M205X2 Other deformities of toe(s) (acquired), left foot: Secondary | ICD-10-CM | POA: Diagnosis not present

## 2024-01-30 DIAGNOSIS — M899 Disorder of bone, unspecified: Secondary | ICD-10-CM | POA: Diagnosis not present

## 2024-01-30 DIAGNOSIS — M19072 Primary osteoarthritis, left ankle and foot: Secondary | ICD-10-CM | POA: Diagnosis not present

## 2024-02-07 DIAGNOSIS — J301 Allergic rhinitis due to pollen: Secondary | ICD-10-CM | POA: Diagnosis not present

## 2024-02-14 DIAGNOSIS — Z981 Arthrodesis status: Secondary | ICD-10-CM | POA: Diagnosis not present

## 2024-02-14 DIAGNOSIS — Z967 Presence of other bone and tendon implants: Secondary | ICD-10-CM | POA: Diagnosis not present

## 2024-02-18 ENCOUNTER — Encounter: Payer: Self-pay | Admitting: Physician Assistant

## 2024-02-20 NOTE — Addendum Note (Signed)
 Addended by: ANTONIETTE VERMELL CROME on: 02/20/2024 12:57 PM   Modules accepted: Orders

## 2024-02-20 NOTE — Telephone Encounter (Signed)
Orders printed

## 2024-02-26 DIAGNOSIS — J301 Allergic rhinitis due to pollen: Secondary | ICD-10-CM | POA: Diagnosis not present

## 2024-03-04 NOTE — Telephone Encounter (Signed)
"  Orders faxed  "

## 2024-03-19 NOTE — Telephone Encounter (Signed)
 Please add this to previous orders.

## 2024-03-26 ENCOUNTER — Encounter: Payer: Self-pay | Admitting: Obstetrics & Gynecology

## 2024-03-26 ENCOUNTER — Ambulatory Visit: Admitting: Obstetrics & Gynecology

## 2024-03-26 VITALS — BP 144/95 | HR 82 | Ht 64.5 in | Wt 225.0 lb

## 2024-03-26 DIAGNOSIS — Z01419 Encounter for gynecological examination (general) (routine) without abnormal findings: Secondary | ICD-10-CM | POA: Diagnosis not present

## 2024-03-26 NOTE — Progress Notes (Addendum)
 "   GYNECOLOGY ANNUAL PREVENTATIVE CARE ENCOUNTER NOTE  History:    Elaine Murillo is a 54 y.o. [id number] female here for a routine annual gynecologic exam.  Current complaints: none.  Still having light periods, but skips months sometimes.  Liletta  in place since 2021.   Denies abnormal vaginal bleeding, discharge, pelvic pain, problems with intercourse or other gynecologic concerns.  Gynecologic History No LMP recorded (lmp unknown). (Menstrual status: IUD). Contraception: Liletta  IUD Last Pap: 11/14/2022. Result was normal with negative HPV Last Mammogram: 06/20/2023.  Result was normal Last Colonoscopy: 02/10/2020.  Result was normal  Obstetric History OB History  Gravida Para Term Preterm AB Living  2 2 2   2   SAB IAB Ectopic Multiple Live Births      2    # Outcome Date GA Lbr Len/2nd Weight Sex Type Anes PTL Lv  2 Term 06/23/12 [redacted]w[redacted]d 01:48 / 00:04 9 lb 6 oz (4.252 kg) M Vag-Spont None  LIV  1 Term 10/22/07 [redacted]w[redacted]d  7 lb 1 oz (3.204 kg) F Vag-Spont EPI N LIV    Past Medical History:  Diagnosis Date   Allergy    seasonal   Anxiety    Arthritis    Hypertension    Uterine fibroids affecting pregnancy     Past Surgical History:  Procedure Laterality Date   bilateral bunion surgery Bilateral    2025   TONSILLECTOMY      Medications Ordered Prior to Encounter[1]  Allergies[2]  Social History:  reports that she has never smoked. She has never used smokeless tobacco. She reports that she does not drink alcohol and does not use drugs.  Family History  Problem Relation Age of Onset   Stroke Paternal Grandmother    Colon cancer Neg Hx    Colon polyps Neg Hx    Esophageal cancer Neg Hx    Rectal cancer Neg Hx    Stomach cancer Neg Hx     The following portions of the patient's history were reviewed and updated as appropriate: allergies, current medications, past family history, past medical history, past social history, past surgical history and problem list.  Review of  Systems Pertinent items noted in HPI and remainder of comprehensive ROS otherwise negative.  Physical Exam:  Chaperone: Delon Maiden, CMA  BP (!) 144/95   Pulse 82   Ht 5' 4.5 (1.638 m)   Wt 225 lb (102.1 kg)   LMP  (LMP Unknown) Comment: IUD- no regular cycles  BMI 38.02 kg/m  CONSTITUTIONAL: Well-developed, well-nourished female in no acute distress.  HENT:  Normocephalic, atraumatic, External right and left ear normal.  EYES: Conjunctivae and EOM are normal. Pupils are equal, round, and reactive to light. No scleral icterus.  NECK: Normal range of motion, supple, no masses observed. SKIN: Skin is warm and dry. No rash noted. Not diaphoretic. No erythema. No pallor. MUSCULOSKELETAL: Normal range of motion. No tenderness.  No cyanosis, clubbing, or edema. NEUROLOGIC: Alert and oriented to person, place, and time. Normal muscle tone coordination.  PSYCHIATRIC: Normal mood and affect. Normal behavior. Normal judgment and thought content. CARDIOVASCULAR: Normal heart rate noted, regular rhythm RESPIRATORY: Clear to auscultation bilaterally. Effort and breath sounds normal, no problems with respiration noted. BREASTS: Symmetric in size. No masses, tenderness, skin changes, nipple drainage, or lymphadenopathy bilaterally. Performed in the presence of a chaperone. ABDOMEN: Soft, no distention noted.  No tenderness, rebound or guarding.  PELVIC: Deferred  Assessment and Plan:    1. Well woman exam (Primary)  Up to date with pap smear, mammogram and colonoscopy. No perimenopausal concerns. Routine preventative health maintenance measures emphasized. Please refer to After Visit Summary for other counseling recommendations.      GLORIS HUGGER, MD, FACOG Obstetrician & Gynecologist, [facility], [facility]     [1]  Current Outpatient Medications on File Prior to Visit  Medication Sig Dispense Refill   cetirizine  (ZYRTEC ) 10 MG  tablet Take 10 mg by mouth daily.     levonorgestrel  (LILETTA , 52 MG,) 19.5 MCG/DAY IUD IUD 1 each by Intrauterine route once.     Omega-3 Fatty Acids (FISH OIL) 1000 MG CAPS Take 4,000 mg by mouth daily.     SOOLANTRA 1 % CREA Apply topically.     No current facility-administered medications on file prior to visit.  [2] No Known Allergies  "

## 2024-03-28 NOTE — Addendum Note (Signed)
 Addended by: BONNY JON DEL on: 03/28/2024 11:40 AM   Modules accepted: Orders

## 2024-03-28 NOTE — Telephone Encounter (Signed)
 Ok for this order to be refaxed with 5-10 placed on it.

## 2024-03-31 ENCOUNTER — Encounter: Admitting: Physician Assistant

## 2024-04-08 ENCOUNTER — Encounter: Admitting: Physician Assistant

## 2024-04-11 ENCOUNTER — Ambulatory Visit: Admitting: Physician Assistant

## 2024-04-11 ENCOUNTER — Encounter: Payer: Self-pay | Admitting: Physician Assistant

## 2024-04-11 VITALS — BP 140/70 | HR 88 | Ht 64.5 in | Wt 226.2 lb

## 2024-04-11 DIAGNOSIS — Z6838 Body mass index (BMI) 38.0-38.9, adult: Secondary | ICD-10-CM

## 2024-04-11 DIAGNOSIS — G4733 Obstructive sleep apnea (adult) (pediatric): Secondary | ICD-10-CM

## 2024-04-11 DIAGNOSIS — R030 Elevated blood-pressure reading, without diagnosis of hypertension: Secondary | ICD-10-CM

## 2024-04-11 DIAGNOSIS — Z0000 Encounter for general adult medical examination without abnormal findings: Secondary | ICD-10-CM

## 2024-04-11 DIAGNOSIS — G8929 Other chronic pain: Secondary | ICD-10-CM | POA: Insufficient documentation

## 2024-04-11 DIAGNOSIS — Z13220 Encounter for screening for lipoid disorders: Secondary | ICD-10-CM

## 2024-04-11 DIAGNOSIS — E559 Vitamin D deficiency, unspecified: Secondary | ICD-10-CM

## 2024-04-11 DIAGNOSIS — Z131 Encounter for screening for diabetes mellitus: Secondary | ICD-10-CM

## 2024-04-11 NOTE — Progress Notes (Signed)
 "  Complete physical exam  Patient: Elaine Murillo   DOB: 10/27/1970   54 y.o. Female  MRN: [id number]  Subjective:    Chief Complaint  Patient presents with   Annual Exam   .Discussed the use of AI scribe software for clinical note transcription with the patient, who gave verbal consent to proceed.  History of Present Illness Elaine Murillo is a 54 year old female with sleep apnea who presents with issues related to CPAP use and blood pressure management.  Obstructive sleep apnea and cpap use - Has been without CPAP machine for approximately five months due to prescription and insurance issues - Snoring observed by husband and children - Unsure if she feels a difference without CPAP - Generally sleeping well, occasionally wakes up once to get water - Describes sleep as 'really good' despite snoring  Hypertension - Monitoring blood pressure at home - Some days with elevated blood pressure readings - Attributes elevated blood pressure partly to lack of exercise following bunion toe fusion surgery around Thanksgiving - Recently resumed exercising after being out of the boot for two weeks - Believes increased activity will help with blood pressure control  Postoperative recovery (bunion toe fusion) - Underwent bunion toe fusion surgery around Thanksgiving - Was unable to exercise during recovery period - Out of boot for two weeks and has resumed exercise  Ophthalmologic symptoms - Visited eye doctor this year after missing last year - Prescription could be made stronger but not necessary at this time    Most recent fall risk assessment:    12/05/2022    3:57 PM  Fall Risk   Falls in the past year? 0  Number falls in past yr: 0  Injury with Fall? 0   Risk for fall due to : No Fall Risks  Follow up Falls evaluation completed     Data saved with a previous flowsheet row definition     Most recent depression screenings:    03/26/2024    8:54 AM 12/05/2022    3:57 PM  PHQ  2/9 Scores  PHQ - 2 Score 0 0  PHQ- 9 Score 0     Vision:Within last year and Dental: No current dental problems and Receives regular dental care  Patient Active Problem List   Diagnosis Date Noted   Severe obstructive sleep apnea 01/05/2023   Pre-hypertension 12/05/2022   Non-restorative sleep 11/21/2022   Snoring 11/21/2022   White coat syndrome without hypertension 04/18/2021   Primary hypertension 12/02/2020   Obesity (BMI 30-39.9) 06/01/2020   Pain in left toe(s) 03/17/2020   Primary osteoarthritis of right hip 09/04/2019   Hand pain 09/04/2019   IUD (intrauterine device) in place 05/27/2019   Facial flushing 12/08/2018   Abnormal RBC 12/08/2018   ETD (Eustachian tube dysfunction), right 12/03/2018   Macromastia 01/18/2018   Dry skin 12/29/2016   Itching 12/29/2016   Elevated blood pressure reading 06/06/2016   Allergic rhinitis with postnasal drip 06/01/2016   Migraine variant 11/16/2015   Hallux valgus of left foot 02/08/2015   Metatarsalgia of left foot 02/08/2015   Class 2 severe obesity due to excess calories with serious comorbidity and body mass index (BMI) of 38.0 to 38.9 in adult 02/08/2015   Right-sided low back pain with right-sided sciatica 02/16/2014   Vitamin D  insufficiency 02/03/2014   Need for prophylactic vaccination and inoculation against influenza 11/21/2012   WEIGHT GAIN 08/20/2008   Past Medical History:  Diagnosis Date   Allergy    seasonal  Anxiety    Arthritis    Hypertension    Uterine fibroids affecting pregnancy    Past Surgical History:  Procedure Laterality Date   bilateral bunion surgery Bilateral    2025   TONSILLECTOMY     Social History[1] Family History  Problem Relation Age of Onset   Stroke Paternal Grandmother    Colon cancer Neg Hx    Colon polyps Neg Hx    Esophageal cancer Neg Hx    Rectal cancer Neg Hx    Stomach cancer Neg Hx    Allergies[2]    Patient Care Team: Lamont Glasscock L, PA-C as PCP - General  (Family Medicine)   Show/hide medication list[3]  ROS See HPI.       Objective:     BP (!) 140/70   Pulse 88   Ht 5' 4.5 (1.638 m)   Wt 226 lb 4 oz (102.6 kg)   LMP  (LMP Unknown) Comment: IUD- no regular cycles  SpO2 99%   BMI 38.24 kg/m  BP Readings from Last 3 Encounters:  04/11/24 (!) 140/70  03/26/24 (!) 144/95  08/08/23 126/81   Wt Readings from Last 3 Encounters:  04/11/24 226 lb 4 oz (102.6 kg)  03/26/24 225 lb (102.1 kg)  08/08/23 219 lb (99.3 kg)      Physical Exam  BP (!) 140/70   Pulse 88   Ht 5' 4.5 (1.638 m)   Wt 226 lb 4 oz (102.6 kg)   LMP  (LMP Unknown) Comment: IUD- no regular cycles  SpO2 99%   BMI 38.24 kg/m   General Appearance:    Alert, cooperative, obese no distress, appears stated age  Head:    Normocephalic, without obvious abnormality, atraumatic  Eyes:    PERRL, conjunctiva/corneas clear, EOM's intact, fundi    benign, both eyes  Ears:    Normal TM's and external ear canals, both ears  Nose:   Nares normal, septum midline, mucosa normal, no drainage    or sinus tenderness  Throat:   Lips, mucosa, and tongue normal; teeth and gums normal  Neck:   Supple, symmetrical, trachea midline, no adenopathy;    thyroid:  no enlargement/tenderness/nodules; no carotid   bruit or JVD  Back:     Symmetric, no curvature, ROM normal, no CVA tenderness  Lungs:     Clear to auscultation bilaterally, respirations unlabored  Chest Wall:    No tenderness or deformity   Heart:    Regular rate and rhythm, S1 and S2 normal, no murmur, rub   or gallop     Abdomen:     Soft, non-tender, bowel sounds active all four quadrants,    no masses, no organomegaly        Extremities:   Extremities normal, atraumatic, no cyanosis or edema  Pulses:   2+ and symmetric all extremities  Skin:   Skin color, texture, turgor normal, no rashes or lesions  Lymph nodes:   Cervical, supraclavicular, and axillary nodes normal  Neurologic:   CNII-XII intact, normal  strength, sensation and reflexes    throughout      Assessment & Plan:    Routine Health Maintenance and Physical Exam  Immunization History  Administered Date(s) Administered   Influenza Split 12/18/2011   Influenza, Seasonal, Injecte, Preservative Fre 12/05/2022   Influenza,inj,Quad PF,6+ Mos 11/21/2012, 12/10/2013, 11/25/2014, 11/16/2015, 12/27/2016, 12/17/2017, 12/03/2018, 12/03/2019, 12/02/2020, 12/05/2021   Influenza-Unspecified 02/04/2024   Moderna Sars-Covid-2 Vaccination 06/05/2019, 07/03/2019   PPD Test 08/06/2023   Tdap  04/10/2012, 12/05/2022   Zoster Recombinant(Shingrix) 12/02/2020, 03/18/2021    Health Maintenance  Topic Date Due   Pneumococcal Vaccine: 50+ Years (1 of 1 - PCV) Never done   COVID-19 Vaccine (3 - Moderna risk series) 04/27/2024 (Originally 07/31/2019)   Hepatitis B Vaccines 19-59 Average Risk (1 of 3 - 19+ 3-dose series) 04/11/2025 (Originally 11/08/1989)   Mammogram  06/19/2024   Cervical Cancer Screening (HPV/Pap Cotest)  11/14/2027   Colonoscopy  02/09/2030   DTaP/Tdap/Td (3 - Td or Tdap) 12/04/2032   Influenza Vaccine  Completed   HPV VACCINES (No Doses Required) Completed   Hepatitis C Screening  Completed   HIV Screening  Completed   Zoster Vaccines- Shingrix  Completed   Meningococcal B Vaccine  Aged Out    Discussed health benefits of physical activity, and encouraged her to engage in regular exercise appropriate for her age and condition. Elaine Murillo was seen today for annual exam.  Diagnoses and all orders for this visit:  Routine physical examination -     Lipid panel -     CMP14+EGFR -     TSH + free T4 -     CBC w/Diff/Platelet -     VITAMIN D  25 Hydroxy (Vit-D Deficiency, Fractures)  Severe obstructive sleep apnea  Class 2 severe obesity due to excess calories with serious comorbidity and body mass index (BMI) of 38.0 to 38.9 in adult -     Lipid panel -     CMP14+EGFR -     TSH + free T4 -     CBC w/Diff/Platelet -      VITAMIN D  25 Hydroxy (Vit-D Deficiency, Fractures)  Vitamin D  insufficiency -     VITAMIN D  25 Hydroxy (Vit-D Deficiency, Fractures)  White coat syndrome without hypertension  Screening for diabetes mellitus -     CMP14+EGFR  Screening for lipid disorders -     Lipid panel    Assessment & Plan Severe obstructive sleep apnea Acknowledged CPAP benefits, including reduced brain fog and improved alertness. Willing to retry CPAP therapy. - Will retry CPAP therapy.  Class 2 severe obesity Resumed exercise post-toe fusion surgery, aiding weight management and blood pressure control. - Encouraged regular exercise.  General health maintenance Up to date with Pap smear and eye examination. Discussed vitamin D  for bone health and calcium absorption. - Ensure vitamin D  supplementation. - Fasting labs ordered today.  - White Coat HTN- continue to monitor at home - Declined vaccines.  - Mammogram scheduled for April 2026.  - Colonoscopy UTD    Return in about 1 year (around 04/11/2025).     Elaine Bologna, PA-C      [1]  Social History Tobacco Use   Smoking status: Never   Smokeless tobacco: Never  Vaping Use   Vaping status: Never Used  Substance Use Topics   Alcohol use: No   Drug use: No  [2] No Known Allergies [3]  Outpatient Medications Prior to Visit  Medication Sig   cetirizine  (ZYRTEC ) 10 MG tablet Take 10 mg by mouth daily.   levonorgestrel  (LILETTA , 52 MG,) 19.5 MCG/DAY IUD IUD 1 each by Intrauterine route once.   Omega-3 Fatty Acids (FISH OIL) 1000 MG CAPS Take 4,000 mg by mouth daily.   SOOLANTRA 1 % CREA Apply topically.   No facility-administered medications prior to visit.   "

## 2024-04-11 NOTE — Patient Instructions (Signed)
 Health Maintenance, Female Adopting a healthy lifestyle and getting preventive care are important in promoting health and wellness. Ask your health care provider about: The right schedule for you to have regular tests and exams. Things you can do on your own to prevent diseases and keep yourself healthy. What should I know about diet, weight, and exercise? Eat a healthy diet  Eat a diet that includes plenty of vegetables, fruits, low-fat dairy products, and lean protein. Do not eat a lot of foods that are high in solid fats, added sugars, or sodium. Maintain a healthy weight Body mass index (BMI) is used to identify weight problems. It estimates body fat based on height and weight. Your health care provider can help determine your BMI and help you achieve or maintain a healthy weight. Get regular exercise Get regular exercise. This is one of the most important things you can do for your health. Most adults should: Exercise for at least 150 minutes each week. The exercise should increase your heart rate and make you sweat (moderate-intensity exercise). Do strengthening exercises at least twice a week. This is in addition to the moderate-intensity exercise. Spend less time sitting. Even light physical activity can be beneficial. Watch cholesterol and blood lipids Have your blood tested for lipids and cholesterol at 54 years of age, then have this test every 5 years. Have your cholesterol levels checked more often if: Your lipid or cholesterol levels are high. You are older than 54 years of age. You are at high risk for heart disease. What should I know about cancer screening? Depending on your health history and family history, you may need to have cancer screening at various ages. This may include screening for: Breast cancer. Cervical cancer. Colorectal cancer. Skin cancer. Lung cancer. What should I know about heart disease, diabetes, and high blood pressure? Blood pressure and heart  disease High blood pressure causes heart disease and increases the risk of stroke. This is more likely to develop in people who have high blood pressure readings or are overweight. Have your blood pressure checked: Every 3-5 years if you are 32-37 years of age. Every year if you are 71 years old or older. Diabetes Have regular diabetes screenings. This checks your fasting blood sugar level. Have the screening done: Once every three years after age 24 if you are at a normal weight and have a low risk for diabetes. More often and at a younger age if you are overweight or have a high risk for diabetes. What should I know about preventing infection? Hepatitis B If you have a higher risk for hepatitis B, you should be screened for this virus. Talk with your health care provider to find out if you are at risk for hepatitis B infection. Hepatitis C Testing is recommended for: Everyone born from [birth year] through 1965. Anyone with known risk factors for hepatitis C. Sexually transmitted infections (STIs) Get screened for STIs, including gonorrhea and chlamydia, if: You are sexually active and are younger than 54 years of age. You are older than 54 years of age and your health care provider tells you that you are at risk for this type of infection. Your sexual activity has changed since you were last screened, and you are at increased risk for chlamydia or gonorrhea. Ask your health care provider if you are at risk. Ask your health care provider about whether you are at high risk for HIV. Your health care provider may recommend a prescription medicine to help prevent HIV  infection. If you choose to take medicine to prevent HIV, you should first get tested for HIV. You should then be tested every 3 months for as long as you are taking the medicine. Pregnancy If you are about to stop having your period (premenopausal) and you may become pregnant, seek counseling before you get pregnant. Take 400 to 800  micrograms (mcg) of folic acid every day if you become pregnant. Ask for birth control (contraception) if you want to prevent pregnancy. Osteoporosis and menopause Osteoporosis is a disease in which the bones lose minerals and strength with aging. This can result in bone fractures. If you are 42 years old or older, or if you are at risk for osteoporosis and fractures, ask your health care provider if you should: Be screened for bone loss. Take a calcium  or vitamin D  supplement to lower your risk of fractures. Be given hormone replacement therapy (HRT) to treat symptoms of menopause. Follow these instructions at home: Alcohol use Do not drink alcohol if: Your health care provider tells you not to drink. You are pregnant, may be pregnant, or are planning to become pregnant. If you drink alcohol: Limit how much you have to: 0-1 drink a day. Know how much alcohol is in your drink. In the [place] one drink equals one 12 oz bottle of beer (355 mL), one 5 oz glass of wine (148 mL), or one 1 oz glass of hard liquor (44 mL). Lifestyle Do not use any products that contain nicotine or tobacco. These products include cigarettes, chewing tobacco, and vaping devices, such as e-cigarettes. If you need help quitting, ask your health care provider. Do not use street drugs. Do not share needles. Ask your health care provider for help if you need support or information about quitting drugs. General instructions Schedule regular health, dental, and eye exams. Stay current with your vaccines. Tell your health care provider if: You often feel depressed. You have ever been abused or do not feel safe at home. This information is not intended to replace advice given to you by your health care provider. Make sure you discuss any questions you have with your health care provider. Document Revised: 08/29/2023 Document Reviewed: 07/12/2020 Elsevier Patient Education  2025 [employer].

## 2025-04-13 ENCOUNTER — Encounter: Admitting: Physician Assistant
